# Patient Record
Sex: Female | Born: 1950 | Race: White | Hispanic: No | Smoking: Current every day smoker
Health system: Southern US, Community
[De-identification: ages and names within clinical notes are randomized; demographics above are authoritative.]

## PROBLEM LIST (undated history)

## (undated) DIAGNOSIS — C801 Malignant (primary) neoplasm, unspecified: Secondary | ICD-10-CM

## (undated) DIAGNOSIS — E785 Hyperlipidemia, unspecified: Secondary | ICD-10-CM

## (undated) DIAGNOSIS — F419 Anxiety disorder, unspecified: Secondary | ICD-10-CM

## (undated) DIAGNOSIS — K219 Gastro-esophageal reflux disease without esophagitis: Secondary | ICD-10-CM

## (undated) DIAGNOSIS — Z720 Tobacco use: Secondary | ICD-10-CM

## (undated) DIAGNOSIS — K259 Gastric ulcer, unspecified as acute or chronic, without hemorrhage or perforation: Secondary | ICD-10-CM

## (undated) DIAGNOSIS — E119 Type 2 diabetes mellitus without complications: Secondary | ICD-10-CM

## (undated) DIAGNOSIS — N189 Chronic kidney disease, unspecified: Secondary | ICD-10-CM

## (undated) DIAGNOSIS — M1990 Unspecified osteoarthritis, unspecified site: Secondary | ICD-10-CM

## (undated) DIAGNOSIS — F32A Depression, unspecified: Secondary | ICD-10-CM

## (undated) DIAGNOSIS — F329 Major depressive disorder, single episode, unspecified: Secondary | ICD-10-CM

## (undated) DIAGNOSIS — J449 Chronic obstructive pulmonary disease, unspecified: Secondary | ICD-10-CM

## (undated) HISTORY — DX: Major depressive disorder, single episode, unspecified: F32.9

## (undated) HISTORY — DX: Depression, unspecified: F32.A

## (undated) HISTORY — PX: TUBAL LIGATION: SHX77

## (undated) HISTORY — PX: FOOT SURGERY: SHX648

## (undated) HISTORY — DX: Gastric ulcer, unspecified as acute or chronic, without hemorrhage or perforation: K25.9

---

## 2010-02-18 ENCOUNTER — Ambulatory Visit: Payer: Self-pay | Admitting: Family Medicine

## 2011-04-23 DIAGNOSIS — K219 Gastro-esophageal reflux disease without esophagitis: Secondary | ICD-10-CM | POA: Insufficient documentation

## 2011-12-22 ENCOUNTER — Ambulatory Visit: Payer: Self-pay | Admitting: Family Medicine

## 2011-12-23 ENCOUNTER — Ambulatory Visit: Payer: Self-pay | Admitting: Family Medicine

## 2013-01-17 ENCOUNTER — Ambulatory Visit: Payer: Self-pay

## 2013-01-18 ENCOUNTER — Ambulatory Visit: Payer: Self-pay

## 2014-07-18 ENCOUNTER — Ambulatory Visit: Payer: Self-pay | Admitting: Internal Medicine

## 2014-07-25 DIAGNOSIS — E118 Type 2 diabetes mellitus with unspecified complications: Secondary | ICD-10-CM | POA: Insufficient documentation

## 2014-07-25 DIAGNOSIS — E1169 Type 2 diabetes mellitus with other specified complication: Secondary | ICD-10-CM | POA: Insufficient documentation

## 2015-01-24 ENCOUNTER — Ambulatory Visit
Admission: EM | Admit: 2015-01-24 | Discharge: 2015-01-24 | Disposition: A | Discharge status: Home / Self Care | Payer: Medicare Other | Attending: Family Medicine | Admitting: Family Medicine

## 2015-01-24 DIAGNOSIS — R300 Dysuria: Secondary | ICD-10-CM

## 2015-01-24 DIAGNOSIS — F17210 Nicotine dependence, cigarettes, uncomplicated: Secondary | ICD-10-CM | POA: Diagnosis not present

## 2015-01-24 DIAGNOSIS — E119 Type 2 diabetes mellitus without complications: Secondary | ICD-10-CM | POA: Diagnosis not present

## 2015-01-24 DIAGNOSIS — R309 Painful micturition, unspecified: Secondary | ICD-10-CM | POA: Insufficient documentation

## 2015-01-24 DIAGNOSIS — K219 Gastro-esophageal reflux disease without esophagitis: Secondary | ICD-10-CM | POA: Diagnosis not present

## 2015-01-24 DIAGNOSIS — F419 Anxiety disorder, unspecified: Secondary | ICD-10-CM | POA: Insufficient documentation

## 2015-01-24 DIAGNOSIS — N3090 Cystitis, unspecified without hematuria: Secondary | ICD-10-CM

## 2015-01-24 HISTORY — DX: Anxiety disorder, unspecified: F41.9

## 2015-01-24 HISTORY — DX: Gastro-esophageal reflux disease without esophagitis: K21.9

## 2015-01-24 HISTORY — DX: Type 2 diabetes mellitus without complications: E11.9

## 2015-01-24 LAB — URINALYSIS COMPLETE WITH MICROSCOPIC (ARMC ONLY)
BILIRUBIN URINE: NEGATIVE
GLUCOSE, UA: NEGATIVE mg/dL
KETONES UR: NEGATIVE mg/dL
NITRITE: POSITIVE — AB
Protein, ur: 100 mg/dL — AB
Specific Gravity, Urine: 1.02 (ref 1.005–1.030)
pH: 5.5 (ref 5.0–8.0)

## 2015-01-24 MED ORDER — CIPROFLOXACIN HCL 500 MG PO TABS: 500.0000 mg | ORAL_TABLET | Freq: Two times a day (BID) | ORAL | Status: DC

## 2015-01-24 NOTE — ED Notes (Signed)
Pt c/o painful urination for the past week with a hx of UTI

## 2015-01-24 NOTE — ED Provider Notes (Addendum)
CSN: [id number]     Arrival date & time 01/24/15  0818 History   First MD Initiated Contact with Patient 01/24/15 [phone]     Chief Complaint  Patient presents with  . Urinary Urgency    painful urination   (Consider location/radiation/quality/duration/timing/severity/associated sxs/prior Treatment) Patient is a 64 y.o. female presenting with dysuria. The history is provided by the patient.  Dysuria Pain quality:  Aching Onset quality:  Sudden Duration:  5 days Timing:  Constant Progression:  Unchanged Chronicity:  New Recent urinary tract infections: no   Relieved by:  Nothing Ineffective treatments:  Acetaminophen Urinary symptoms: discolored urine, frequent urination and hesitancy   Urinary symptoms: no foul-smelling urine   Associated symptoms: no fever, no vaginal discharge and no vomiting   Associated symptoms comment:  Chills Risk factors: no hx of pyelonephritis and no renal disease     Past Medical History  Diagnosis Date  . Diabetes mellitus without complication   . GERD (gastroesophageal reflux disease)   . Anxiety    Past Surgical History  Procedure Laterality Date  . Tubal ligation     No family history on file. History  Substance Use Topics  . Smoking status: Current Every Day Smoker -- 0.50 packs/day    Types: Cigarettes  . Smokeless tobacco: Never Used  . Alcohol Use: No   OB History    Gravida Para Term Preterm AB TAB SAB Ectopic Multiple Living   2 2 2  0 0 0 0 0 0 0     Review of Systems  Constitutional: Negative for fever.  Gastrointestinal: Negative for vomiting.  Genitourinary: Positive for dysuria. Negative for vaginal discharge.    Allergies  Review of patient's allergies indicates no known allergies.  Home Medications   Prior to Admission medications   Medication Sig Start Date End Date Taking? Authorizing Provider  clonazePAM (KLONOPIN) 1 MG tablet Take 1 mg by mouth 2 (two) times daily.   Yes Historical Provider, MD  metFORMIN  (GLUCOPHAGE) 500 MG tablet Take 500 mg by mouth daily with breakfast.   Yes Historical Provider, MD  omeprazole (PRILOSEC OTC) 20 MG tablet Take 20 mg by mouth daily.   Yes Historical Provider, MD  ciprofloxacin (CIPRO) 500 MG tablet Take 1 tablet (500 mg total) by mouth every 12 (twelve) hours. 01/24/15   Norval Gable, MD   BP 125/75 mmHg  Pulse 71  Temp(Src) 97.5 F (36.4 C) (Oral)  Resp 16  Ht 5\' 2"  (1.575 m)  Wt 157 lb (71.215 kg)  BMI 28.71 kg/m2  SpO2 97% Physical Exam  Constitutional: She appears well-developed and well-nourished. No distress.  Abdominal: Soft. Bowel sounds are normal. She exhibits no distension and no mass. There is tenderness (mild suprapubic tenderness to palpation; no CVA tenderness). There is no rebound and no guarding.  Skin: She is not diaphoretic.  Nursing note and vitals reviewed.   ED Course  Procedures (including critical care time) Labs Review Labs Reviewed  URINALYSIS COMPLETEWITH MICROSCOPIC [place])     Imaging Review No results found.   MDM   1. Dysuria   2. Cystitis    . New Prescriptions   CIPROFLOXACIN (CIPRO) 500 MG TABLET    Take 1 tablet (500 mg total) by mouth every 12 (twelve) hours.  Plan: 1. Test/x-ray results and diagnosis reviewed with patient 2. rx as per orders; risks, benefits, potential side effects reviewed with patient 3. Recommend supportive treatment with increased fluids 4. F/u prn if symptoms worsen or don't improve  Norval Gable, MD 01/24/15 6553  Norval Gable, MD 01/24/15 [phone]

## 2015-01-24 NOTE — Discharge Instructions (Signed)

## 2015-03-31 DIAGNOSIS — F324 Major depressive disorder, single episode, in partial remission: Secondary | ICD-10-CM | POA: Insufficient documentation

## 2015-11-16 ENCOUNTER — Ambulatory Visit (INDEPENDENT_AMBULATORY_CARE_PROVIDER_SITE_OTHER)
Admission: EM | Admit: 2015-11-16 | Discharge: 2015-11-16 | Disposition: A | Discharge status: Other Acute Inpatient Hospital | Payer: Medicare Other | Source: Home / Self Care | Attending: Family Medicine | Admitting: Family Medicine

## 2015-11-16 ENCOUNTER — Encounter
Admission: EM | Disposition: A | Discharge status: Home / Self Care | Payer: Self-pay | Source: Home / Self Care | Attending: Urology

## 2015-11-16 ENCOUNTER — Encounter: Payer: Self-pay | Admitting: Gynecology

## 2015-11-16 ENCOUNTER — Inpatient Hospital Stay
Admission: EM | Admit: 2015-11-16 | Discharge: 2015-11-20 | DRG: 689 | Disposition: A | Discharge status: Home / Self Care | Payer: Medicare Other | Attending: Urology | Admitting: Urology

## 2015-11-16 ENCOUNTER — Encounter: Payer: Self-pay | Admitting: Anesthesiology

## 2015-11-16 ENCOUNTER — Emergency Department: Payer: Medicare Other

## 2015-11-16 ENCOUNTER — Inpatient Hospital Stay: Payer: Medicare Other | Admitting: Anesthesiology

## 2015-11-16 DIAGNOSIS — J9811 Atelectasis: Secondary | ICD-10-CM | POA: Diagnosis present

## 2015-11-16 DIAGNOSIS — E119 Type 2 diabetes mellitus without complications: Secondary | ICD-10-CM | POA: Diagnosis present

## 2015-11-16 DIAGNOSIS — N200 Calculus of kidney: Secondary | ICD-10-CM

## 2015-11-16 DIAGNOSIS — J449 Chronic obstructive pulmonary disease, unspecified: Secondary | ICD-10-CM | POA: Diagnosis present

## 2015-11-16 DIAGNOSIS — N136 Pyonephrosis: Secondary | ICD-10-CM | POA: Diagnosis present

## 2015-11-16 DIAGNOSIS — N201 Calculus of ureter: Secondary | ICD-10-CM | POA: Diagnosis not present

## 2015-11-16 DIAGNOSIS — R509 Fever, unspecified: Secondary | ICD-10-CM

## 2015-11-16 DIAGNOSIS — I9589 Other hypotension: Secondary | ICD-10-CM | POA: Diagnosis not present

## 2015-11-16 DIAGNOSIS — R109 Unspecified abdominal pain: Secondary | ICD-10-CM | POA: Diagnosis not present

## 2015-11-16 DIAGNOSIS — B961 Klebsiella pneumoniae [K. pneumoniae] as the cause of diseases classified elsewhere: Secondary | ICD-10-CM | POA: Diagnosis present

## 2015-11-16 DIAGNOSIS — R1032 Left lower quadrant pain: Secondary | ICD-10-CM

## 2015-11-16 DIAGNOSIS — J9601 Acute respiratory failure with hypoxia: Secondary | ICD-10-CM | POA: Diagnosis present

## 2015-11-16 DIAGNOSIS — F17210 Nicotine dependence, cigarettes, uncomplicated: Secondary | ICD-10-CM | POA: Diagnosis present

## 2015-11-16 DIAGNOSIS — M545 Low back pain, unspecified: Secondary | ICD-10-CM

## 2015-11-16 DIAGNOSIS — N390 Urinary tract infection, site not specified: Secondary | ICD-10-CM | POA: Diagnosis not present

## 2015-11-16 DIAGNOSIS — Z8249 Family history of ischemic heart disease and other diseases of the circulatory system: Secondary | ICD-10-CM | POA: Diagnosis not present

## 2015-11-16 DIAGNOSIS — E785 Hyperlipidemia, unspecified: Secondary | ICD-10-CM | POA: Diagnosis present

## 2015-11-16 DIAGNOSIS — Z9851 Tubal ligation status: Secondary | ICD-10-CM

## 2015-11-16 DIAGNOSIS — K219 Gastro-esophageal reflux disease without esophagitis: Secondary | ICD-10-CM | POA: Diagnosis present

## 2015-11-16 HISTORY — DX: Hyperlipidemia, unspecified: E78.5

## 2015-11-16 HISTORY — DX: Chronic obstructive pulmonary disease, unspecified: J44.9

## 2015-11-16 HISTORY — PX: CYSTOSCOPY WITH STENT PLACEMENT: SHX5790

## 2015-11-16 HISTORY — DX: Tobacco use: Z72.0

## 2015-11-16 LAB — LIPASE, BLOOD: Lipase: 33 U/L (ref 11–51)

## 2015-11-16 LAB — URINALYSIS COMPLETE WITH MICROSCOPIC (ARMC ONLY)
BILIRUBIN URINE: NEGATIVE
GLUCOSE, UA: NEGATIVE mg/dL
KETONES UR: NEGATIVE mg/dL
Nitrite: NEGATIVE
Protein, ur: 30 mg/dL — AB
Specific Gravity, Urine: 1.015 (ref 1.005–1.030)
pH: 5 (ref 5.0–8.0)

## 2015-11-16 LAB — COMPREHENSIVE METABOLIC PANEL
ALBUMIN: 4.1 g/dL (ref 3.5–5.0)
ALT: 21 U/L (ref 14–54)
ANION GAP: 7 (ref 5–15)
AST: 23 U/L (ref 15–41)
Alkaline Phosphatase: 48 U/L (ref 38–126)
BUN: 20 mg/dL (ref 6–20)
CALCIUM: 9.1 mg/dL (ref 8.9–10.3)
CO2: 25 mmol/L (ref 22–32)
Chloride: 105 mmol/L (ref 101–111)
Creatinine, Ser: 1.33 mg/dL — ABNORMAL HIGH (ref 0.44–1.00)
GFR calc Af Amer: 47 mL/min — ABNORMAL LOW (ref >60)
GFR calc non Af Amer: 41 mL/min — ABNORMAL LOW (ref >60)
Glucose, Bld: 174 mg/dL — ABNORMAL HIGH (ref 65–99)
Potassium: 3.9 mmol/L (ref 3.5–5.1)
Sodium: 137 mmol/L (ref 135–145)
Total Bilirubin: 1.1 mg/dL (ref 0.3–1.2)
Total Protein: 7.1 g/dL (ref 6.5–8.1)

## 2015-11-16 LAB — CBC WITH DIFFERENTIAL/PLATELET
Basophils Absolute: 0.1 10*3/uL (ref 0–0.1)
Basophils Relative: 0 %
EOS ABS: 0 10*3/uL (ref 0–0.7)
Eosinophils Relative: 0 %
HCT: 43.5 % (ref 35.0–47.0)
Hemoglobin: 14.8 g/dL (ref 12.0–16.0)
Lymphocytes Relative: 3 %
Lymphs Abs: 0.6 10*3/uL — ABNORMAL LOW (ref 1.0–3.6)
MCH: 30.6 pg (ref 26.0–34.0)
MCHC: 34.1 g/dL (ref 32.0–36.0)
MCV: 89.6 fL (ref 80.0–100.0)
MONOS PCT: 2 %
Monocytes Absolute: 0.3 10*3/uL (ref 0.2–0.9)
Neutro Abs: 19.6 10*3/uL — ABNORMAL HIGH (ref 1.4–6.5)
Neutrophils Relative %: 95 %
Platelets: 238 10*3/uL (ref 150–440)
RBC: 4.85 MIL/uL (ref 3.80–5.20)
RDW: 13 % (ref 11.5–14.5)
WBC: 20.7 10*3/uL — ABNORMAL HIGH (ref 3.6–11.0)

## 2015-11-16 LAB — GLUCOSE, CAPILLARY: Glucose-Capillary: 115 mg/dL — ABNORMAL HIGH (ref 65–99)

## 2015-11-16 SURGERY — CYSTOSCOPY, WITH STENT INSERTION
Anesthesia: Choice | Laterality: Left

## 2015-11-16 SURGERY — CYSTOSCOPY, WITH STENT INSERTION
Anesthesia: General | Laterality: Left | Wound class: Clean Contaminated

## 2015-11-16 MED ORDER — DEXTROSE 5 % IV SOLN
1.0000 g | INTRAVENOUS | Status: DC
  Administered 2015-11-17 – 2015-11-18 (×2): 1 g via INTRAVENOUS
  Filled 2015-11-16 (×2): qty 10

## 2015-11-16 MED ORDER — FENTANYL CITRATE (PF) 100 MCG/2ML IJ SOLN: 25.0000 ug | INTRAMUSCULAR | Status: DC | PRN

## 2015-11-16 MED ORDER — PHENYLEPHRINE HCL 10 MG/ML IJ SOLN
INTRAMUSCULAR | Status: DC | PRN
  Administered 2015-11-16: 150 ug via INTRAVENOUS

## 2015-11-16 MED ORDER — OXYCODONE HCL 5 MG PO TABS
5.0000 mg | ORAL_TABLET | ORAL | Status: DC | PRN
  Administered 2015-11-17 (×2): 5 mg via ORAL
  Administered 2015-11-17 – 2015-11-18 (×4): 10 mg via ORAL
  Administered 2015-11-18: 5 mg via ORAL
  Administered 2015-11-19 (×2): 10 mg via ORAL
  Administered 2015-11-20: 5 mg via ORAL
  Administered 2015-11-20: 10 mg via ORAL
  Filled 2015-11-16: qty 2
  Filled 2015-11-16 (×3): qty 1
  Filled 2015-11-16 (×2): qty 2
  Filled 2015-11-16: qty 1
  Filled 2015-11-16 (×2): qty 2
  Filled 2015-11-16 (×2): qty 1
  Filled 2015-11-16: qty 2

## 2015-11-16 MED ORDER — ONDANSETRON HCL 4 MG/2ML IJ SOLN
INTRAMUSCULAR | Status: DC | PRN
  Administered 2015-11-16: 4 mg via INTRAVENOUS

## 2015-11-16 MED ORDER — ONDANSETRON HCL 4 MG/2ML IJ SOLN
4.0000 mg | INTRAMUSCULAR | Status: DC | PRN
  Administered 2015-11-18 – 2015-11-19 (×4): 4 mg via INTRAVENOUS
  Filled 2015-11-16 (×4): qty 2

## 2015-11-16 MED ORDER — OXYCODONE HCL 5 MG PO TABS: 5.0000 mg | ORAL_TABLET | Freq: Once | ORAL | Status: DC | PRN

## 2015-11-16 MED ORDER — SODIUM CHLORIDE 0.9 % IV BOLUS (SEPSIS)
1000.0000 mL | Freq: Once | INTRAVENOUS | Status: AC
  Administered 2015-11-16: 1000 mL via INTRAVENOUS

## 2015-11-16 MED ORDER — SODIUM CHLORIDE 0.9 % IV SOLN
INTRAVENOUS | Status: DC | PRN
  Administered 2015-11-16: 20:00:00 via INTRAVENOUS

## 2015-11-16 MED ORDER — DEXTROSE 5 % IV SOLN
1.0000 g | Freq: Once | INTRAVENOUS | Status: AC
  Administered 2015-11-16: 1 g via INTRAVENOUS
  Filled 2015-11-16: qty 10

## 2015-11-16 MED ORDER — OXYCODONE HCL 5 MG/5ML PO SOLN: 5.0000 mg | Freq: Once | ORAL | Status: DC | PRN

## 2015-11-16 MED ORDER — LACTATED RINGERS IV SOLN
INTRAVENOUS | Status: DC
  Administered 2015-11-16 – 2015-11-18 (×4): via INTRAVENOUS

## 2015-11-16 MED ORDER — ACETAMINOPHEN 325 MG PO TABS
650.0000 mg | ORAL_TABLET | Freq: Four times a day (QID) | ORAL | Status: DC
  Administered 2015-11-16 – 2015-11-19 (×11): 650 mg via ORAL
  Filled 2015-11-16 (×11): qty 2

## 2015-11-16 MED ORDER — MORPHINE SULFATE (PF) 4 MG/ML IV SOLN
4.0000 mg | Freq: Once | INTRAVENOUS | Status: AC
  Administered 2015-11-16: 4 mg via INTRAVENOUS
  Filled 2015-11-16: qty 1

## 2015-11-16 MED ORDER — HYDROMORPHONE HCL 1 MG/ML IJ SOLN: 0.5000 mg | INTRAMUSCULAR | Status: DC | PRN

## 2015-11-16 MED ORDER — ONDANSETRON HCL 4 MG/2ML IJ SOLN
4.0000 mg | Freq: Once | INTRAMUSCULAR | Status: AC
Start: 2015-11-16 — End: 2015-11-16
  Administered 2015-11-16: 4 mg via INTRAVENOUS
  Filled 2015-11-16: qty 2

## 2015-11-16 MED ORDER — DOCUSATE SODIUM 100 MG PO CAPS
100.0000 mg | ORAL_CAPSULE | Freq: Two times a day (BID) | ORAL | Status: DC
Start: 2015-11-16 — End: 2015-11-20
  Administered 2015-11-16 – 2015-11-20 (×8): 100 mg via ORAL
  Filled 2015-11-16 (×9): qty 1

## 2015-11-16 MED ORDER — IOTHALAMATE MEGLUMINE 43 % IV SOLN
INTRAVENOUS | Status: DC | PRN
  Administered 2015-11-16: 5 mL via URETHRAL

## 2015-11-16 SURGICAL SUPPLY — 24 items
BAG DRAIN CYSTO-URO LG1000N (MISCELLANEOUS) IMPLANT
CATH URETL 5X70 OPEN END (CATHETERS) ×3 IMPLANT
CNTNR SPEC 2.5X3XGRAD LEK (MISCELLANEOUS) ×1
CONRAY 43 FOR UROLOGY 50M (MISCELLANEOUS) ×3 IMPLANT
CONT SPEC 4OZ STER OR WHT (MISCELLANEOUS) ×2
CONTAINER SPEC 2.5X3XGRAD LEK (MISCELLANEOUS) ×1 IMPLANT
GLOVE BIO SURGEON STRL SZ7 (GLOVE) ×6 IMPLANT
GLOVE BIO SURGEON STRL SZ7.5 (GLOVE) IMPLANT
GOWN STRL REUS W/ TWL LRG LVL4 (GOWN DISPOSABLE) ×2 IMPLANT
GOWN STRL REUS W/TWL LRG LVL4 (GOWN DISPOSABLE) ×4
GUIDEWIRE STR ZIPWIRE 035X150 (MISCELLANEOUS) ×3 IMPLANT
KIT RM TURNOVER CYSTO AR (KITS) ×3 IMPLANT
PACK CYSTO AR (MISCELLANEOUS) ×3 IMPLANT
PREP PVP WINGED SPONGE (MISCELLANEOUS) ×3 IMPLANT
SENSORWIRE 0.038 NOT ANGLED (WIRE) ×3
SET CYSTO W/LG BORE CLAMP LF (SET/KITS/TRAYS/PACK) ×3 IMPLANT
SOL .9 NS 3000ML IRR  AL (IV SOLUTION)
SOL .9 NS 3000ML IRR UROMATIC (IV SOLUTION) IMPLANT
SOL PREP PVP 2OZ (MISCELLANEOUS) ×3
SOLUTION PREP PVP 2OZ (MISCELLANEOUS) ×1 IMPLANT
STENT URET 6FRX24 CONTOUR (STENTS) IMPLANT
STENT URET 6FRX26 CONTOUR (STENTS) ×3 IMPLANT
WATER STERILE IRR 1000ML POUR (IV SOLUTION) ×3 IMPLANT
WIRE SENSOR 0.038 NOT ANGLED (WIRE) ×1 IMPLANT

## 2015-11-16 NOTE — ED Notes (Signed)
Patient c/o lower back pain that rotate to her lower abdomen x this am.

## 2015-11-16 NOTE — H&P (Signed)
Samantha Welch is an 65 y.o. female.   Chief Complaint: left ureteral stone HPI: 65 y.o. female who presents to the ER this afternoon due to left flank and abdominal pain.  She reports waking up at 8 am due to pain. She has had nausea, vomiting, and feeling weak.  She has never had any stones in the past.  She denies dysuria. Denies hematuria. No measured fevers however her friend reports a concern for chills. Her pain is alleviated by the morphine she received. No other precipitating or worsening factor  Past Medical History  Diagnosis Date  . Diabetes mellitus without complication ([place])   . GERD (gastroesophageal reflux disease)   . Anxiety   . Hyperlipemia   . COPD (chronic obstructive pulmonary disease) ([place])   . Tobacco abuse disorder     Past Surgical History  Procedure Laterality Date  . Tubal ligation      No family history on file. Social History:  reports that she has been smoking Cigarettes.  She has been smoking about 0.50 packs per day. She has never used smokeless tobacco. She reports that she does not drink alcohol or use illicit drugs.  Allergies: No Known Allergies   (Not in a hospital admission)  Results for orders placed or performed during the hospital encounter of 11/16/15 (from the past 48 hour(s))  CBC with Differential     Status: Abnormal   Collection Time: 11/16/15  2:56 PM  Result Value Ref Range   WBC 20.7 (H) 3.6 - 11.0 K/uL   RBC 4.85 3.80 - 5.20 MIL/uL   Hemoglobin 14.8 12.0 - 16.0 g/dL   HCT 43.5 35.0 - 47.0 %   MCV 89.6 80.0 - 100.0 fL   MCH 30.6 26.0 - 34.0 pg   MCHC 34.1 32.0 - 36.0 g/dL   RDW 13.0 11.5 - 14.5 %   Platelets 238 150 - 440 K/uL   Neutrophils Relative % 95 %   Neutro Abs 19.6 (H) 1.4 - 6.5 K/uL   Lymphocytes Relative 3 %   Lymphs Abs 0.6 (L) 1.0 - 3.6 K/uL   Monocytes Relative 2 %   Monocytes Absolute 0.3 0.2 - 0.9 K/uL   Eosinophils Relative 0 %   Eosinophils Absolute 0.0 0 - 0.7 K/uL   Basophils Relative 0 %   Basophils Absolute 0.1 0 - 0.1 K/uL  Lipase, blood     Status: None   Collection Time: 11/16/15  2:56 PM  Result Value Ref Range   Lipase 33 11 - 51 U/L  Comprehensive metabolic panel     Status: Abnormal   Collection Time: 11/16/15  2:56 PM  Result Value Ref Range   Sodium 137 135 - 145 mmol/L   Potassium 3.9 3.5 - 5.1 mmol/L   Chloride 105 101 - 111 mmol/L   CO2 25 22 - 32 mmol/L   Glucose, Bld 174 (H) 65 - 99 mg/dL   BUN 20 6 - 20 mg/dL   Creatinine, Ser 1.33 (H) 0.44 - 1.00 mg/dL   Calcium 9.1 8.9 - 10.3 mg/dL   Total Protein 7.1 6.5 - 8.1 g/dL   Albumin 4.1 3.5 - 5.0 g/dL   AST 23 15 - 41 U/L   ALT 21 14 - 54 U/L   Alkaline Phosphatase 48 38 - 126 U/L   Total Bilirubin 1.1 0.3 - 1.2 mg/dL   GFR calc non Af Amer 41 (L) >60 mL/min   GFR calc Af Amer 47 (L) >60 mL/min  Comment: (NOTE) The eGFR has been calculated using the CKD EPI equation. This calculation has not been validated in all clinical situations. eGFR's persistently <60 mL/min signify possible Chronic Kidney Disease.    Anion gap 7 5 - 15  Urinalysis complete, with microscopic ([facility] only)     Status: Abnormal   Collection Time: 11/16/15  2:56 PM  Result Value Ref Range   Color, Urine YELLOW (A) YELLOW   APPearance CLOUDY (A) CLEAR   Glucose, UA NEGATIVE NEGATIVE mg/dL   Bilirubin Urine NEGATIVE NEGATIVE   Ketones, ur NEGATIVE NEGATIVE mg/dL   Specific Gravity, Urine 1.015 1.005 - 1.030   Hgb urine dipstick 3+ (A) NEGATIVE   pH 5.0 5.0 - 8.0   Protein, ur 30 (A) NEGATIVE mg/dL   Nitrite NEGATIVE NEGATIVE   Leukocytes, UA 2+ (A) NEGATIVE   RBC / HPF TOO NUMEROUS TO COUNT 0 - 5 RBC/hpf   WBC, UA TOO NUMEROUS TO COUNT 0 - 5 WBC/hpf   Bacteria, UA MANY (A) NONE SEEN   Squamous Epithelial / LPF 0-5 (A) NONE SEEN   WBC Clumps PRESENT    Mucous PRESENT    Ct Renal Stone Study  11/16/2015  CLINICAL DATA:  Awoke with left flank pain. EXAM: CT ABDOMEN AND PELVIS WITHOUT CONTRAST TECHNIQUE: Multidetector CT  imaging of the abdomen and pelvis was performed following the standard protocol without IV contrast. COMPARISON:  None. FINDINGS: Lower chest: Minimal dependent atelectasis at the lung bases. Heart is normal in size. No pleural effusion. Liver: Mild diffusely decreased density consistent with steatosis. No evidence of focal lesion allowing for lack contrast. Hepatobiliary: Gallbladder physiologically distended, no calcified stone. No biliary dilatation. Pancreas: No ductal dilatation or inflammation. Mild motion artifact at this level. Spleen: Normal, allowing for mild motion artifact. Adrenal glands: No nodule. Kidneys: Obstructing 3 x 6 mm stone in the left proximal ureter (at the level of L2) with proximal hydroureteronephrosis and perinephric stranding. Ureter distal to this is decompressed. No additional nonobstructing stones in either kidney. No right hydronephrosis. Stomach/Bowel: Limited assessment secondary to lack of enteric contrast and mild motion artifact. Stomach physiologically distended with ingested contents. There are no dilated or thickened small bowel loops. Small volume of stool throughout the colon without colonic wall thickening. The appendix is tentatively identified, no periappendiceal or pericecal inflammatory change. Vascular/Lymphatic: No retroperitoneal adenopathy. Abdominal aorta is normal in caliber. Mild to moderate atherosclerosis without aneurysm. Reproductive: Uterus remains in situ.  No adnexal mass. Bladder: Physiologically distended, no wall thickening. No bladder stone. Other: No free air, free fluid, or intra-abdominal fluid collection. Musculoskeletal: There are no acute or suspicious osseous abnormalities. IMPRESSION: Obstructing 3 x 6 mm stone in the left proximal ureter with proximal hydroureteronephrosis. Electronically Signed   By: Jeb Levering M.D.   On: 11/16/2015 18:18    Review of Systems  Constitutional: Positive for chills. Negative for fever, weight loss,  malaise/fatigue and diaphoresis.  HENT: Negative for hearing loss and sore throat.   Eyes: Negative for blurred vision and double vision.  Respiratory: Negative for cough, hemoptysis and wheezing.   Cardiovascular: Negative for chest pain and claudication.  Gastrointestinal: Positive for nausea and abdominal pain. Negative for heartburn, vomiting, diarrhea, constipation and blood in stool.  Genitourinary: Positive for flank pain. Negative for dysuria, urgency, frequency and hematuria.  Musculoskeletal: Negative for myalgias and back pain.  Skin: Negative for itching and rash.  Neurological: Positive for weakness. Negative for tingling, seizures and headaches.  Endo/Heme/Allergies: Does not bruise/bleed  easily.  Psychiatric/Behavioral: Negative for depression. The patient is nervous/anxious.     BP 120/60 mmHg  Pulse 93  Temp(Src) 99.7 F (37.6 C) (Oral)  Resp 20  Ht 5' 2"  (1.575 m)  Wt 73.483 kg (162 lb)  BMI 29.62 kg/m2  SpO2 92%   General: well-developed female, in no acute distress HEENT: PERRL, EOMI, OP moist without lesions Neck: supple, trachea midline; no thyromegaly Chest: normal to palpation Lungs: clear bilaterally without wheeze or retractions Cardiac: RRR without murmurs, gallops, rubs; distal pulses 2+ Abd: Soft, nontender, nondistended, positive bowel sounds.  No organomegaly Ext: No clubbing, cyanosis, edema Neuro: CN grossly intact. Moves all extremities Derm: no significant rashes or nodules Lymph: no cervical or supraclavicular nodes  Assessment/Plan 65 y.o. female with an infected left ureteral stone.  Her WBC is 20k and thus urgent decompression is warranted. We will proceed with urgent placement of a ureteral stent.   We discussed the risks associated with surgery including bleeding, infection, damage to surrounding structures, need for secondary procedures. She is aware there is a risk of heart attack, stroke, DVTs, and even death.     Kelii Chittum J  Madden-Fuentes 11/16/2015, 7:04 PM

## 2015-11-16 NOTE — ED Notes (Signed)
Patient transported to OR at this time.

## 2015-11-16 NOTE — Anesthesia Procedure Notes (Signed)
Procedure Name: LMA Insertion Date/Time: 11/16/2015 8:07 PM Performed by: Jonna Clark Pre-anesthesia Checklist: Patient identified, Patient being monitored, Timeout performed, Emergency Drugs available and Suction available Patient Re-evaluated:Patient Re-evaluated prior to inductionOxygen Delivery Method: Circle system utilized Preoxygenation: Pre-oxygenation with 100% oxygen Intubation Type: IV induction Ventilation: Mask ventilation without difficulty LMA: LMA inserted LMA Size: 3.5 Tube type: Oral Number of attempts: 1 Placement Confirmation: positive ETCO2 and breath sounds checked- equal and bilateral Tube secured with: Tape Dental Injury: Teeth and Oropharynx as per pre-operative assessment

## 2015-11-16 NOTE — ED Notes (Signed)
Patient denies N/V/D.

## 2015-11-16 NOTE — ED Notes (Signed)
Report given to OR nurse at this time. States she will come and get the patient once consent is signed.

## 2015-11-16 NOTE — Op Note (Signed)
PREOPERATIVE DIAGNOSIS:  Infected left ureteral stone   POSTOPERATIVE DIAGNOSIS:  Infected left ureteral stone   SURGERY DATE: 11/16/2015  PROCEDURE:  1) Cystourethroscopy  2) left retrograde Pyelogram 3) left ureteral stent placement 4) Fluoroscopy   ATTENDING SURGEON:  . Dereck Leep, MD   ANESTHESIA:  Choice   ESTIMATED BLOOD LOSS:  minimal  SPECIMENS:  None.   DRAINS:  A 6-French x 26 cm double-J ureteral stent.   INDICATIONS FOR PROCEDURE:   This is a 65 y.o. female who presented to the ER due to flank pain. CT demonstrated a proximal left ureteral stone and a WBC of 20k.. Given this, the decision was made to proceed to the OR for stent placement  We discussed the risks associated with surgery including bleeding, infection, damage to surrounding structures, need for secondary procedures. She is aware there is a risk of heart attack, stroke, DVTs, and even death.     DESCRIPTION OF PROCEDURE IN DETAIL:  After informed consent was obtained the patient was brought to the operative theater and placed on the operating room table in the supine position.  Anesthesia was induced.  The patient was then placed in the dorsal lithotomy position. The patient was prepped and draped in the usual sterile fashion.  The patient was given appropriate antibiotics within 1 hour at the start of the case.  Bilateral sequential compression devices were placed on his legs for DVT prophylaxis.  A surgical time-out occurred with all in the room in agreement with the surgery to be performed and the site of surgery.     A 21-French rigid cystoscope was placed in the patient's bladder via his urethra.  The urethra was noted to be normal with no stricture or scarring.  Upon entry into the bladder it was evaluated, and found to be normal.  The patient's left ureteral orifice was identified, and a 0.038 Sensor wire was used to cannulate it.  A 6 french open ended ureteral catheter was then advanced  into the ureter over the wire. The wire was removed and a retrograde pyelogram was obtained.  Retrograde pyelogram demonstrated mild dilation of the calyces, mild proximal and renal pelvic dilation.   A sensor guidewire was then advanced through the ureteral catheter and placed up the ureter and advanced to the level of the renal pelvis under fluoroscopic guidance.    Next a 6-French x 26 cm double-J ureteral stent was placed over the wire and advanced to the level of the renal pelvis under fluoroscopic guidance.  When the stent was noted to be within the correct position the Sensor wire was removed, and a good coil was seen within the renal pelvis by fluoroscopy as well as within the bladder both by fluoroscopy and by direct vision with the use of the cystoscope.  The patient's bladder was then drained, the cystoscope was removed.  The patient tolerated the procedure well, and postoperatively was transported to the PACU in stable condition.

## 2015-11-16 NOTE — ED Notes (Signed)
Per EMS, patient comes from [facility] with c/o left abdominal, back, and arm pain. The abdominal and back pain is new and it woke the patient from her sleep. The arm pain has been ongoing x3 weeks. [place] states the patient was hypotensive with a systolic in the [id number]. Current BP is 140/59. Patient is A&O x4. Resting well during assessment.

## 2015-11-16 NOTE — ED Provider Notes (Signed)
[facility] Emergency Department Provider Note    ____________________________________________  Time seen: ~1440  I have reviewed the triage vital signs and the nursing notes.   HISTORY  Chief Complaint Abdominal Pain   History limited by: Not Limited   HPI Samantha Welch is a 65 y.o. female who presents to the emergency department today because of concerns for left flank pain. The patient states that it woke her up this morning. Has been constant since then. It is severe. It is sharp. It wraps around into her left groin. The patient had associated nausea and vomiting this morning. Denies similar symptoms in the past.    Past Medical History  Diagnosis Date  . Diabetes mellitus without complication ([place])   . GERD (gastroesophageal reflux disease)   . Anxiety   . Hyperlipemia   . COPD (chronic obstructive pulmonary disease) ([place])   . Tobacco abuse disorder     There are no active problems to display for this patient.   Past Surgical History  Procedure Laterality Date  . Tubal ligation      Current Outpatient Rx  Name  Route  Sig  Dispense  Refill  . aspirin 81 MG tablet   Oral   Take 81 mg by mouth daily.         . ciprofloxacin (CIPRO) 500 MG tablet   Oral   Take 1 tablet (500 mg total) by mouth every 12 (twelve) hours.   14 tablet   0   . clonazePAM (KLONOPIN) 1 MG tablet   Oral   Take 1 mg by mouth 2 (two) times daily.         . metFORMIN (GLUCOPHAGE) 500 MG tablet   Oral   Take 500 mg by mouth daily with breakfast.         . omega-3 acid ethyl esters (LOVAZA) 1 g capsule   Oral   Take by mouth 2 (two) times daily.         Marland Kitchen omeprazole (PRILOSEC OTC) 20 MG tablet   Oral   Take 20 mg by mouth daily.         . pravastatin (PRAVACHOL) 40 MG tablet   Oral   Take 40 mg by mouth daily.         Marland Kitchen tiotropium (SPIRIVA) 18 MCG inhalation capsule   Inhalation   Place 18 mcg into inhaler and inhale daily.          Marland Kitchen venlafaxine XR (EFFEXOR-XR) 75 MG 24 hr capsule   Oral   Take 75 mg by mouth daily with breakfast.           Allergies Review of patient's allergies indicates no known allergies.  No family history on file.  Social History Social History  Substance Use Topics  . Smoking status: Current Every Day Smoker -- 0.50 packs/day    Types: Cigarettes  . Smokeless tobacco: Never Used  . Alcohol Use: No    Review of Systems  Constitutional: Negative for fever. Cardiovascular: Negative for chest pain. Respiratory: Negative for shortness of breath. Gastrointestinal: Positive for left flank pain and left groin pain. Neurological: Negative for headaches, focal weakness or numbness.   10-point ROS otherwise negative.  ____________________________________________   PHYSICAL EXAM:  VITAL SIGNS: ED Triage Vitals  Enc Vitals Group     BP 11/16/15 1452 140/59 mmHg     Pulse Rate 11/16/15 1452 73     Resp 11/16/15 1452 18     Temp 11/16/15  1452 99.3 F (37.4 C)     Temp Source 11/16/15 1452 Oral     SpO2 11/16/15 1452 98 %     Weight 11/16/15 1452 162 lb (73.483 kg)     Height 11/16/15 1452 5\' 2"  (1.575 m)     Head Cir --      Peak Flow --      Pain Score 11/16/15 1453 8   Constitutional: Alert and oriented. Appears mildly uncomfortable Eyes: Conjunctivae are normal. PERRL. Normal extraocular movements. ENT   Head: Normocephalic and atraumatic.   Nose: No congestion/rhinnorhea.   Mouth/Throat: Mucous membranes are moist.   Neck: No stridor. Hematological/Lymphatic/Immunilogical: No cervical lymphadenopathy. Cardiovascular: Normal rate, regular rhythm.  No murmurs, rubs, or gallops. Respiratory: Normal respiratory effort without tachypnea nor retractions. Breath sounds are clear and equal bilaterally. No wheezes/rales/rhonchi. Gastrointestinal: Soft and nontender. No distention.  Genitourinary: Deferred Musculoskeletal: Normal range of motion in all  extremities. No joint effusions.  No lower extremity tenderness nor edema. Neurologic:  Normal speech and language. No gross focal neurologic deficits are appreciated.  Skin:  Skin is warm, dry and intact. No rash noted. Psychiatric: Mood and affect are normal. Speech and behavior are normal. Patient exhibits appropriate insight and judgment.  ____________________________________________    LABS (pertinent positives/negatives)  Labs Reviewed  CBC WITH DIFFERENTIAL/PLATELET - Abnormal; Notable for the following:    WBC 20.7 (*)    Neutro Abs 19.6 (*)    Lymphs Abs 0.6 (*)    All other components within normal limits  COMPREHENSIVE METABOLIC PANEL - Abnormal; Notable for the following:    Glucose, Bld 174 (*)    Creatinine, Ser 1.33 (*)    GFR calc non Af Amer 41 (*)    GFR calc Af Amer 47 (*)    All other components within normal limits  URINALYSIS COMPLETEWITH MICROSCOPIC ([facility] ONLY) - Abnormal; Notable for the following:    Color, Urine YELLOW (*)    APPearance CLOUDY (*)    Hgb urine dipstick 3+ (*)    Protein, ur 30 (*)    Leukocytes, UA 2+ (*)    Bacteria, UA MANY (*)    Squamous Epithelial / LPF 0-5 (*)    All other components within normal limits  LIPASE, BLOOD     ____________________________________________   EKG  None  ____________________________________________    RADIOLOGY  CT renal  IMPRESSION: Obstructing 3 x 6 mm stone in the left proximal ureter with proximal hydroureteronephrosis.  ____________________________________________   PROCEDURES  Procedure(s) performed: None  Critical Care performed: No  ____________________________________________   INITIAL IMPRESSION / ASSESSMENT AND PLAN / ED COURSE  Pertinent labs & imaging results that were available during my care of the patient were reviewed by me and considered in my medical decision making (see chart for details).  Patient presented to the emergency department today because of  concerns for left flank pain that started suddenly this morning. Urinalysis was concerning for multiple white blood cells as well as red blood cells. Given patient's history of pain a renal study CT was obtained. This did show a 3 x 6 mm stone in the left proximal ureter. Given the stone and concern for infection urology was consulted. They will plan on admitting the patient for likely stent placement tonight.  ____________________________________________   FINAL CLINICAL IMPRESSION(S) / ED DIAGNOSES  Final diagnoses:  Left flank pain  UTI (lower urinary tract infection)  Kidney stone     Nance Pear, MD 11/16/15 [id number]

## 2015-11-16 NOTE — Anesthesia Preprocedure Evaluation (Signed)
Anesthesia Evaluation  Patient identified by MRN, date of birth, ID band Patient awake    Reviewed: Allergy & Precautions, H&P , NPO status , Patient's Chart, lab work & pertinent test results  History of Anesthesia Complications Negative for: history of anesthetic complications  Airway Mallampati: III  TM Distance: >3 FB Neck ROM: limited    Dental  (+) Poor Dentition, Chipped, Missing   Pulmonary shortness of breath and with exertion, COPD, Current Smoker,    Pulmonary exam normal breath sounds clear to auscultation       Cardiovascular Exercise Tolerance: Good (-) angina+ DOE  (-) Past MI Normal cardiovascular exam Rhythm:regular Rate:Normal     Neuro/Psych PSYCHIATRIC DISORDERS Anxiety negative neurological ROS     GI/Hepatic negative GI ROS, Neg liver ROS, GERD  Controlled,  Endo/Other  diabetes, Type 2, Oral Hypoglycemic Agents  Renal/GU negative Renal ROS  negative genitourinary   Musculoskeletal   Abdominal   Peds  Hematology negative hematology ROS (+)   Anesthesia Other Findings Past Medical History:   Diabetes mellitus without complication (HCC)                 GERD (gastroesophageal reflux disease)                       Anxiety                                                      Hyperlipemia                                                 COPD (chronic obstructive pulmonary disease) (*              Tobacco abuse disorder                                      Past Surgical History:   TUBAL LIGATION                                               BMI    Body Mass Index   29.62 kg/m 2      Reproductive/Obstetrics negative OB ROS                             Anesthesia Physical Anesthesia Plan  ASA: III  Anesthesia Plan: General LMA   Post-op Pain Management:    Induction:   Airway Management Planned:   Additional Equipment:   Intra-op Plan:   Post-operative  Plan:   Informed Consent: I have reviewed the patients History and Physical, chart, labs and discussed the procedure including the risks, benefits and alternatives for the proposed anesthesia with the patient or authorized representative who has indicated his/her understanding and acceptance.   Dental Advisory Given  Plan Discussed with: Anesthesiologist, CRNA and Surgeon  Anesthesia Plan Comments:         Anesthesia Quick Evaluation

## 2015-11-16 NOTE — ED Notes (Signed)
Patient transported to CT 

## 2015-11-16 NOTE — ED Provider Notes (Signed)
CSN: [id number]     Arrival date & time 11/16/15  1245 History   First MD Initiated Contact with Patient 11/16/15 1337     Chief Complaint  Patient presents with  . Back Pain   (Consider location/radiation/quality/duration/timing/severity/associated sxs/prior Treatment) HPI: Patient presents today with symptoms of left lower back pain. Patient states that the pain woke her up out of sleep early this morning. The pain radiates to her left abdomen. She denies any urinary symptoms. She has vomited a few times since the symptoms started earlier today. She denies any known discogenic problems. She denies any alcohol use but does smoke. She denies any cardiac history. She has had some left shoulder pain for the last few weeks. She denies any weakness of any extremities. She denies any known history of aneurysm or nephrolithiasis. She does complain of some dry mouth.  Past Medical History  Diagnosis Date  . Diabetes mellitus without complication ([place])   . GERD (gastroesophageal reflux disease)   . Anxiety   . Hyperlipemia   . COPD (chronic obstructive pulmonary disease) ([place])   . Tobacco abuse disorder    Past Surgical History  Procedure Laterality Date  . Tubal ligation     No family history on file. Social History  Substance Use Topics  . Smoking status: Current Every Day Smoker -- 0.50 packs/day    Types: Cigarettes  . Smokeless tobacco: Never Used  . Alcohol Use: No   OB History    Gravida Para Term Preterm AB TAB SAB Ectopic Multiple Living   2 2 2  0 0 0 0 0 0 0     Review of Systems: Negative except mentioned above.  Allergies  Review of patient's allergies indicates no known allergies.  Home Medications   Prior to Admission medications   Medication Sig Start Date End Date Taking? Authorizing Provider  aspirin 81 MG tablet Take 81 mg by mouth daily.   Yes Historical Provider, MD  metFORMIN (GLUCOPHAGE) 500 MG tablet Take 500 mg by mouth daily with breakfast.   Yes  Historical Provider, MD  omega-3 acid ethyl esters (LOVAZA) 1 g capsule Take by mouth 2 (two) times daily.   Yes Historical Provider, MD  pravastatin (PRAVACHOL) 40 MG tablet Take 40 mg by mouth daily.   Yes Historical Provider, MD  tiotropium (SPIRIVA) 18 MCG inhalation capsule Place 18 mcg into inhaler and inhale daily.   Yes Historical Provider, MD  venlafaxine XR (EFFEXOR-XR) 75 MG 24 hr capsule Take 75 mg by mouth daily with breakfast.   Yes Historical Provider, MD  ciprofloxacin (CIPRO) 500 MG tablet Take 1 tablet (500 mg total) by mouth every 12 (twelve) hours. 01/24/15   Norval Gable, MD  clonazePAM (KLONOPIN) 1 MG tablet Take 1 mg by mouth 2 (two) times daily.    Historical Provider, MD  omeprazole (PRILOSEC OTC) 20 MG tablet Take 20 mg by mouth daily.    Historical Provider, MD   Meds Ordered and Administered this Visit  Medications - No data to display  BP 88/43 mmHg  Pulse 71  Temp(Src) 98.5 F (36.9 C) (Oral)  Resp 16  Ht 5\' 2"  (1.575 m)  Wt 152 lb (68.947 kg)  BMI 27.79 kg/m2  SpO2 97% No data found.   Physical Exam   GENERAL: pale  HEENT: no pharyngeal erythema, no exudate, no erythema of TMs, no cervical LAD RESP: CTA B CARD: RRR ABD: +BS, mild left lower quadrant tenderness, no bruits heard, no masses appreciated MSK: points  to left SI area as area of discomfort, FROM, -SLR NEURO: AAO, CN II-XII grossly intact   ED Course  Procedures (including critical care time)  Labs Review Labs Reviewed  URINE CULTURE  URINALYSIS COMPLETEWITH MICROSCOPIC ([place])    Imaging Review No results found.    MDM  A/P: Abdominal pain, hypotension- EKG was done which does show normal sinus rhythm with nonspecific T-wave changes, no ST elevations or depressions. Unsure of etiology of patient's symptoms could be related to nephrolithiasis, aneurysm,etc. Will need further evaluation with labs, UA, imaging at ER. EMS is here to take patient to the ER. IV was placed prior  to transfer.    Paulina Fusi, MD 11/16/15 1416

## 2015-11-16 NOTE — Transfer of Care (Signed)
Immediate Anesthesia Transfer of Care Note  Patient: Samantha Welch  Procedure(s) Performed: Procedure(s): CYSTOSCOPY WITH STENT PLACEMENT (Left)  Patient Location: PACU  Anesthesia Type:General  Level of Consciousness: awake, alert  and oriented  Airway & Oxygen Therapy: Patient Spontanous Breathing and Patient connected to face mask oxygen  Post-op Assessment: Report given to RN and Post -op Vital signs reviewed and stable  Post vital signs: Reviewed and stable  Last Vitals:  Filed Vitals:   11/16/15 1930 11/16/15 2040  BP: 103/50 107/51  Pulse: 94 81  Temp:  37.8 C  Resp: 25 17    Complications: No apparent anesthesia complications

## 2015-11-17 ENCOUNTER — Encounter: Payer: Self-pay | Admitting: Urology

## 2015-11-17 ENCOUNTER — Inpatient Hospital Stay: Payer: Medicare Other

## 2015-11-17 DIAGNOSIS — N200 Calculus of kidney: Secondary | ICD-10-CM

## 2015-11-17 LAB — BASIC METABOLIC PANEL
ANION GAP: 7 (ref 5–15)
BUN: 21 mg/dL — AB (ref 6–20)
CALCIUM: 7.7 mg/dL — AB (ref 8.9–10.3)
CO2: 24 mmol/L (ref 22–32)
Chloride: 105 mmol/L (ref 101–111)
Creatinine, Ser: 1.32 mg/dL — ABNORMAL HIGH (ref 0.44–1.00)
GFR calc Af Amer: 48 mL/min — ABNORMAL LOW (ref >60)
GFR, EST NON AFRICAN AMERICAN: 41 mL/min — AB (ref >60)
GLUCOSE: 162 mg/dL — AB (ref 65–99)
Potassium: 4.1 mmol/L (ref 3.5–5.1)
Sodium: 136 mmol/L (ref 135–145)

## 2015-11-17 LAB — INFLUENZA PANEL BY PCR (TYPE A & B)
H1N1FLUPCR: NOT DETECTED
Influenza A By PCR: NEGATIVE
Influenza B By PCR: NEGATIVE

## 2015-11-17 LAB — CBC
HCT: 38 % (ref 35.0–47.0)
Hemoglobin: 12.9 g/dL (ref 12.0–16.0)
MCH: 30.3 pg (ref 26.0–34.0)
MCHC: 33.8 g/dL (ref 32.0–36.0)
MCV: 89.7 fL (ref 80.0–100.0)
PLATELETS: 195 10*3/uL (ref 150–440)
RBC: 4.24 MIL/uL (ref 3.80–5.20)
RDW: 13.4 % (ref 11.5–14.5)
WBC: 22.4 10*3/uL — AB (ref 3.6–11.0)

## 2015-11-17 LAB — GLUCOSE, CAPILLARY: Glucose-Capillary: 162 mg/dL — ABNORMAL HIGH (ref 65–99)

## 2015-11-17 MED ORDER — VENLAFAXINE HCL ER 75 MG PO CP24
75.0000 mg | ORAL_CAPSULE | Freq: Every day | ORAL | Status: DC
  Administered 2015-11-18 – 2015-11-20 (×3): 75 mg via ORAL
  Filled 2015-11-17 (×3): qty 1

## 2015-11-17 MED ORDER — PANTOPRAZOLE SODIUM 40 MG PO TBEC
40.0000 mg | DELAYED_RELEASE_TABLET | Freq: Every day | ORAL | Status: DC
  Administered 2015-11-17 – 2015-11-20 (×4): 40 mg via ORAL
  Filled 2015-11-17 (×4): qty 1

## 2015-11-17 MED ORDER — METFORMIN HCL 500 MG PO TABS
1000.0000 mg | ORAL_TABLET | Freq: Every day | ORAL | Status: DC
  Administered 2015-11-18 – 2015-11-20 (×3): 1000 mg via ORAL
  Filled 2015-11-17 (×3): qty 2

## 2015-11-17 MED ORDER — TIOTROPIUM BROMIDE MONOHYDRATE 18 MCG IN CAPS
18.0000 ug | ORAL_CAPSULE | Freq: Every day | RESPIRATORY_TRACT | Status: DC
  Administered 2015-11-17 – 2015-11-20 (×4): 18 ug via RESPIRATORY_TRACT
  Filled 2015-11-17: qty 5

## 2015-11-17 MED ORDER — PRAVASTATIN SODIUM 20 MG PO TABS
20.0000 mg | ORAL_TABLET | Freq: Every day | ORAL | Status: DC
  Administered 2015-11-17 – 2015-11-19 (×3): 20 mg via ORAL
  Filled 2015-11-17 (×3): qty 1

## 2015-11-17 MED ORDER — CLONAZEPAM 1 MG PO TABS
1.0000 mg | ORAL_TABLET | Freq: Two times a day (BID) | ORAL | Status: DC
  Administered 2015-11-17 – 2015-11-20 (×6): 1 mg via ORAL
  Filled 2015-11-17 (×6): qty 1

## 2015-11-17 MED ORDER — INSULIN ASPART 100 UNIT/ML ~~LOC~~ SOLN: 0.0000 [IU] | Freq: Three times a day (TID) | SUBCUTANEOUS | Status: DC

## 2015-11-17 NOTE — Progress Notes (Signed)
Urology Follow Up  Subjective: Low grade temp 99.  Mild flank pain.  Tolerating diet.  UCx GNR, pending. WBC rising.    Anti-infectives: Anti-infectives    Start     Dose/Rate Route Frequency Ordered Stop   11/17/15 1000  cefTRIAXone (ROCEPHIN) 1 g in dextrose 5 % 50 mL IVPB     1 g 100 mL/hr over 30 Minutes Intravenous Every 24 hours 11/16/15 2140     11/16/15 1715  cefTRIAXone (ROCEPHIN) 1 g in dextrose 5 % 50 mL IVPB     1 g 100 mL/hr over 30 Minutes Intravenous  Once 11/16/15 1707 11/16/15 1753      Current Facility-Administered Medications  Medication Dose Route Frequency Provider Last Rate Last Dose  . acetaminophen (TYLENOL) tablet 650 mg  650 mg Oral Q6H Ramiro J Madden-Fuentes, MD   650 mg at 11/17/15 1026  . cefTRIAXone (ROCEPHIN) 1 g in dextrose 5 % 50 mL IVPB  1 g Intravenous Q24H Ramiro J Madden-Fuentes, MD   1 g at 11/17/15 1024  . docusate sodium (COLACE) capsule 100 mg  100 mg Oral BID Delmer Islam Madden-Fuentes, MD   100 mg at 11/17/15 1024  . HYDROmorphone (DILAUDID) injection 0.5 mg  0.5 mg Intravenous Q3H PRN Ramiro J Madden-Fuentes, MD      . lactated ringers infusion   Intravenous Continuous Ramiro J Madden-Fuentes, MD 125 mL/hr at 11/17/15 1027 1 mL at 11/17/15 1027  . [START ON 11/18/2015] metFORMIN (GLUCOPHAGE) tablet 1,000 mg  1,000 mg Oral Q breakfast Festus Aloe, MD      . ondansetron [facility]) injection 4 mg  4 mg Intravenous Q4H PRN Ramiro J Madden-Fuentes, MD      . oxyCODONE (Oxy IR/ROXICODONE) immediate release tablet 5-10 mg  5-10 mg Oral Q4H PRN Delmer Islam Madden-Fuentes, MD   10 mg at 11/17/15 0905     Objective: Vital signs in last 24 hours: Temp:  [97.5 F (36.4 C)-100.1 F (37.8 C)] 98.4 F (36.9 C) (03/06 1224) Pulse Rate:  [73-97] 85 (03/06 1224) Resp:  [16-25] 19 (03/06 1224) BP: (101-144)/(41-97) 111/51 mmHg (03/06 1224) SpO2:  [92 %-100 %] 93 % (03/06 1224) Weight:  [162 lb (73.483 kg)-165 lb 1.6 oz (74.889 kg)] 165 lb 1.6 oz (74.889  kg) (03/05 2141)  Intake/Output from previous day: 03/05 0701 - 03/06 0700 In: 1985 [P.O.:360; I.V.:1625] Out: 475 [Urine:475] Intake/Output this shift: Total I/O In: 877.5 [P.O.:240; I.V.:587.5; IV Piggyback:50] Out: 500 [Urine:500]   Physical Exam  Constitutional: She is oriented to person, place, and time and well-developed, well-nourished, and in no distress.  HENT:  Head: Normocephalic and atraumatic.  Neck: Normal range of motion.  Cardiovascular: Normal rate.   Pulmonary/Chest: Effort normal. No respiratory distress.  Abdominal: Soft. Bowel sounds are normal. She exhibits no distension. There is no tenderness.  Genitourinary:  Foley in place draining clear yellow urine.    Musculoskeletal: Normal range of motion. She exhibits no edema.  Neurological: She is alert and oriented to person, place, and time.  Skin: Skin is warm and dry.  Psychiatric: Affect normal.    Lab Results:   Recent Labs  11/16/15 1456 11/17/15 0557  WBC 20.7* 22.4*  HGB 14.8 12.9  HCT 43.5 38.0  PLT 238 195   BMET  Recent Labs  11/16/15 1456 11/17/15 0557  NA 137 136  K 3.9 4.1  CL 105 105  CO2 25 24  GLUCOSE 174* 162*  BUN 20 21*  CREATININE 1.33* 1.32*  CALCIUM  9.1 7.7*   Studies/Results: Ct Renal Stone Study  11/16/2015  CLINICAL DATA:  Awoke with left flank pain. EXAM: CT ABDOMEN AND PELVIS WITHOUT CONTRAST TECHNIQUE: Multidetector CT imaging of the abdomen and pelvis was performed following the standard protocol without IV contrast. COMPARISON:  None. FINDINGS: Lower chest: Minimal dependent atelectasis at the lung bases. Heart is normal in size. No pleural effusion. Liver: Mild diffusely decreased density consistent with steatosis. No evidence of focal lesion allowing for lack contrast. Hepatobiliary: Gallbladder physiologically distended, no calcified stone. No biliary dilatation. Pancreas: No ductal dilatation or inflammation. Mild motion artifact at this level. Spleen: Normal,  allowing for mild motion artifact. Adrenal glands: No nodule. Kidneys: Obstructing 3 x 6 mm stone in the left proximal ureter (at the level of L2) with proximal hydroureteronephrosis and perinephric stranding. Ureter distal to this is decompressed. No additional nonobstructing stones in either kidney. No right hydronephrosis. Stomach/Bowel: Limited assessment secondary to lack of enteric contrast and mild motion artifact. Stomach physiologically distended with ingested contents. There are no dilated or thickened small bowel loops. Small volume of stool throughout the colon without colonic wall thickening. The appendix is tentatively identified, no periappendiceal or pericecal inflammatory change. Vascular/Lymphatic: No retroperitoneal adenopathy. Abdominal aorta is normal in caliber. Mild to moderate atherosclerosis without aneurysm. Reproductive: Uterus remains in situ.  No adnexal mass. Bladder: Physiologically distended, no wall thickening. No bladder stone. Other: No free air, free fluid, or intra-abdominal fluid collection. Musculoskeletal: There are no acute or suspicious osseous abnormalities. IMPRESSION: Obstructing 3 x 6 mm stone in the left proximal ureter with proximal hydroureteronephrosis. Electronically Signed   By: Jeb Levering M.D.   On: 11/16/2015 18:18     Assessment: s/p Procedure(s): CYSTOSCOPY WITH LEFT STENT PLACEMENT, LEFT RETROGRADE  Plan: Continue ceftriaxone Repeat WBC in aM F/u urine culture results D/c foley   Likely d/c home tomorrow if stable    LOS: 1 day    Hollice Espy 11/17/2015

## 2015-11-17 NOTE — Anesthesia Postprocedure Evaluation (Signed)
Anesthesia Post Note  Patient: Samantha Welch  Procedure(s) Performed: Procedure(s) (LRB): CYSTOSCOPY WITH LEFT STENT PLACEMENT, LEFT RETROGRADE (Left)  Patient location during evaluation: PACU Anesthesia Type: General Level of consciousness: awake and alert Pain management: pain level controlled Vital Signs Assessment: post-procedure vital signs reviewed and stable Respiratory status: spontaneous breathing, nonlabored ventilation, respiratory function stable and patient connected to nasal cannula oxygen Cardiovascular status: blood pressure returned to baseline and stable Postop Assessment: no signs of nausea or vomiting Anesthetic complications: no    Last Vitals:  Filed Vitals:   11/16/15 2330 11/17/15 0408  BP: 101/41 108/45  Pulse: 80 81  Temp: 36.4 C 37.2 C  Resp: 18 18    Last Pain:  Filed Vitals:   11/17/15 0529  PainSc: Asleep                 Precious Haws Pattijo Juste

## 2015-11-17 NOTE — Consult Note (Signed)
[place] [facility] [place] NAME: Samantha Welch    MR#:  [id number]  DATE OF BIRTH:  [birth year]  DATE OF ADMISSION:  11/16/2015  PRIMARY CARE PHYSICIAN: [facility] [place]   REQUESTING/REFERRING PHYSICIAN: Dr. Imagene Riches  CHIEF COMPLAINT:   Chief Complaint  Patient presents with  . Abdominal Pain     Reason for consult - is fever HISTORY OF PRESENT ILLNESS: Samantha Welch  is a 65 y.o. female with a known history of diabetes, anxiety, hyperlipidemia, COPD, smoking- came to the hospital and admitted to urology service with left flank pain found to have a ureteral stone with left ureteral stent is placed by urology and she is on Rocephin IV for UTI and possible pyelonephritis..   Medical consult was called in today as patient again had fever, tachypnea, hypoxia.  Patient denies any new complaints, her pain is now under control after stenting and with pain medications. She denies any cough or shortness of breath. She feels slightly dizzy on getting up and walking up to the bathroom. Denies any diarrhea, nausea, vomiting.  PAST MEDICAL HISTORY:   Past Medical History  Diagnosis Date  . Diabetes mellitus without complication ([place])   . GERD (gastroesophageal reflux disease)   . Anxiety   . Hyperlipemia   . COPD (chronic obstructive pulmonary disease) ([place])   . Tobacco abuse disorder     PAST SURGICAL HISTORY: Past Surgical History  Procedure Laterality Date  . Tubal ligation    . Cystoscopy with stent placement Left 11/16/2015    Procedure: CYSTOSCOPY WITH LEFT STENT PLACEMENT, LEFT RETROGRADE;  Surgeon: Dereck Leep, MD;  Location: [facility] ORS;  Service: Urology;  Laterality: Left;    SOCIAL HISTORY:  Social History  Substance Use Topics  . Smoking status: Current Every Day Smoker -- 0.50 packs/day    Types: Cigarettes  . Smokeless tobacco: Never Used  . Alcohol Use: No    FAMILY HISTORY: Hypertension in mother.  DRUG  ALLERGIES: No Known Allergies  REVIEW OF SYSTEMS:   CONSTITUTIONAL: positive for fever, fatigue or weakness.  EYES: No blurred or double vision.  EARS, NOSE, AND THROAT: No tinnitus or ear pain.  RESPIRATORY: No cough, shortness of breath, wheezing or hemoptysis.  CARDIOVASCULAR: No chest pain, orthopnea, edema.  GASTROINTESTINAL: No nausea, vomiting, diarrhea or abdominal pain.  GENITOURINARY: No dysuria, hematuria.  ENDOCRINE: No polyuria, nocturia,  HEMATOLOGY: No anemia, easy bruising or bleeding SKIN: No rash or lesion. MUSCULOSKELETAL: No joint pain or arthritis.   NEUROLOGIC: No tingling, numbness, weakness.  PSYCHIATRY: No anxiety or depression.   MEDICATIONS AT HOME:  Prior to Admission medications   Medication Sig Start Date End Date Taking? Authorizing Provider  aspirin EC 81 MG tablet Take 81 mg by mouth at bedtime.   Yes Historical Provider, MD  clonazePAM (KLONOPIN) 1 MG tablet Take 1 mg by mouth 2 (two) times daily.   Yes Historical Provider, MD  hydrOXYzine (ATARAX/VISTARIL) 25 MG tablet Take 25 mg by mouth at bedtime. 11/03/15  Yes Historical Provider, MD  metFORMIN (GLUCOPHAGE) 1000 MG tablet Take 1,000 mg by mouth every morning. 10/05/15  Yes Historical Provider, MD  omega-3 acid ethyl esters (LOVAZA) 1 g capsule Take 1-2 g by mouth daily as needed (for cholesterol.).    Yes Historical Provider, MD  omeprazole (PRILOSEC OTC) 20 MG tablet Take 20 mg by mouth daily.   Yes Historical Provider, MD  pravastatin (PRAVACHOL) 20 MG tablet Take 20  mg by mouth at bedtime. 11/03/15  Yes Historical Provider, MD  tiotropium (SPIRIVA) 18 MCG inhalation capsule Place 18 mcg into inhaler and inhale daily.   Yes Historical Provider, MD  venlafaxine XR (EFFEXOR-XR) 75 MG 24 hr capsule Take 75 mg by mouth daily with breakfast.   Yes Historical Provider, MD  ciprofloxacin (CIPRO) 500 MG tablet Take 1 tablet (500 mg total) by mouth every 12 (twelve) hours. 01/24/15   Norval Gable, MD       PHYSICAL EXAMINATION:   VITAL SIGNS: Blood pressure 127/52, pulse 96, temperature 100.5 F (38.1 C), temperature source Oral, resp. rate 30, height 5\' 2"  (1.575 m), weight 74.889 kg (165 lb 1.6 oz), SpO2 86 %.  GENERAL:  65 y.o.-year-old patient lying in the bed with no acute distress.  EYES: Pupils equal, round, reactive to light and accommodation. No scleral icterus. Extraocular muscles intact.  HEENT: Head atraumatic, normocephalic. Oropharynx and nasopharynx clear.  NECK:  Supple, no jugular venous distention. No thyroid enlargement, no tenderness.  LUNGS: Normal breath sounds bilaterally, no wheezing, some crepitation. No use of accessory muscles of respiration.  CARDIOVASCULAR: S1, S2 normal. No murmurs, rubs, or gallops.  ABDOMEN: Soft, mild left flank tender, nondistended. Bowel sounds present. No organomegaly or mass.  EXTREMITIES: No pedal edema, cyanosis, or clubbing.  NEUROLOGIC: Cranial nerves II through XII are intact. Muscle strength 5/5 in all extremities. Sensation intact. Gait not checked.  PSYCHIATRIC: The patient is alert and oriented x 3.  SKIN: No obvious rash, lesion, or ulcer.   LABORATORY PANEL:   CBC  Recent Labs Lab 11/16/15 1456 11/17/15 0557  WBC 20.7* 22.4*  HGB 14.8 12.9  HCT 43.5 38.0  PLT 238 195  MCV 89.6 89.7  MCH 30.6 30.3  MCHC 34.1 33.8  RDW 13.0 13.4  LYMPHSABS 0.6*  --   MONOABS 0.3  --   EOSABS 0.0  --   BASOSABS 0.1  --    ------------------------------------------------------------------------------------------------------------------  Chemistries   Recent Labs Lab 11/16/15 1456 11/17/15 0557  NA 137 136  K 3.9 4.1  CL 105 105  CO2 25 24  GLUCOSE 174* 162*  BUN 20 21*  CREATININE 1.33* 1.32*  CALCIUM 9.1 7.7*  AST 23  --   ALT 21  --   ALKPHOS 48  --   BILITOT 1.1  --    ------------------------------------------------------------------------------------------------------------------ estimated creatinine  clearance is 39.7 mL/min (by C-G formula based on Cr of 1.32). ------------------------------------------------------------------------------------------------------------------ No results for input(s): TSH, T4TOTAL, T3FREE, THYROIDAB in the last 72 hours.  Invalid input(s): FREET3   Coagulation profile No results for input(s): INR, PROTIME in the last 168 hours. ------------------------------------------------------------------------------------------------------------------- No results for input(s): DDIMER in the last 72 hours. -------------------------------------------------------------------------------------------------------------------  Cardiac Enzymes No results for input(s): CKMB, TROPONINI, MYOGLOBIN in the last 168 hours.  Invalid input(s): CK ------------------------------------------------------------------------------------------------------------------ Invalid input(s): POCBNP  ---------------------------------------------------------------------------------------------------------------  Urinalysis    Component Value Date/Time   COLORURINE YELLOW* 11/16/2015 1456   APPEARANCEUR CLOUDY* 11/16/2015 1456   LABSPEC 1.015 11/16/2015 1456   PHURINE 5.0 11/16/2015 1456   GLUCOSEU NEGATIVE 11/16/2015 1456   HGBUR 3+* 11/16/2015 1456   BILIRUBINUR NEGATIVE 11/16/2015 1456   KETONESUR NEGATIVE 11/16/2015 1456   PROTEINUR 30* 11/16/2015 1456   NITRITE NEGATIVE 11/16/2015 1456   LEUKOCYTESUR 2+* 11/16/2015 1456     RADIOLOGY: Ct Renal Stone Study  11/16/2015  CLINICAL DATA:  Awoke with left flank pain. EXAM: CT ABDOMEN AND PELVIS WITHOUT CONTRAST TECHNIQUE: Multidetector CT imaging of the abdomen and  pelvis was performed following the standard protocol without IV contrast. COMPARISON:  None. FINDINGS: Lower chest: Minimal dependent atelectasis at the lung bases. Heart is normal in size. No pleural effusion. Liver: Mild diffusely decreased density consistent with  steatosis. No evidence of focal lesion allowing for lack contrast. Hepatobiliary: Gallbladder physiologically distended, no calcified stone. No biliary dilatation. Pancreas: No ductal dilatation or inflammation. Mild motion artifact at this level. Spleen: Normal, allowing for mild motion artifact. Adrenal glands: No nodule. Kidneys: Obstructing 3 x 6 mm stone in the left proximal ureter (at the level of L2) with proximal hydroureteronephrosis and perinephric stranding. Ureter distal to this is decompressed. No additional nonobstructing stones in either kidney. No right hydronephrosis. Stomach/Bowel: Limited assessment secondary to lack of enteric contrast and mild motion artifact. Stomach physiologically distended with ingested contents. There are no dilated or thickened small bowel loops. Small volume of stool throughout the colon without colonic wall thickening. The appendix is tentatively identified, no periappendiceal or pericecal inflammatory change. Vascular/Lymphatic: No retroperitoneal adenopathy. Abdominal aorta is normal in caliber. Mild to moderate atherosclerosis without aneurysm. Reproductive: Uterus remains in situ.  No adnexal mass. Bladder: Physiologically distended, no wall thickening. No bladder stone. Other: No free air, free fluid, or intra-abdominal fluid collection. Musculoskeletal: There are no acute or suspicious osseous abnormalities. IMPRESSION: Obstructing 3 x 6 mm stone in the left proximal ureter with proximal hydroureteronephrosis. Electronically Signed   By: Jeb Levering M.D.   On: 11/16/2015 18:18    EKG: Orders placed or performed during the hospital encounter of 11/16/15  . ED EKG  . ED EKG  . EKG 12-Lead  . EKG 12-Lead    IMPRESSION AND PLAN:  * Sepsis- with fever, tachypnea, elevated white cell count, hypoxia.   Patient is being treated for pyelonephritis by urology with IV Rocephin. Pending urine culture.     I will check for influenza swab, blood culture,  chest x-ray    Currently no need to add additional antibiotics, unless her blood pressure starts dropping.     * Acute hypoxic respiratory failure   Check chest x-ray for any presence of pneumonia.   No active wheezing.   I decreased IV fluid is patient is now tolerating diet properly.  * Diabetes  Continue metformin, added sliding scale coverage.  * COPD  No active wheezing, continue Spiriva.  * Hyperlipidemia   Continue pravastatin.   All the records are reviewed and case discussed with ED provider. Management plans discussed with the patient, family and they are in agreement.  CODE STATUS: Full code Code Status History    This patient does not have a recorded code status. Please follow your organizational policy for patients in this situation.       TOTAL TIME TAKING CARE OF THIS PATIENT: 50 minutes.    Vaughan Basta M.D on 11/17/2015   Between 7am to 6pm - Pager - [phone]  After 6pm go to [email or web address] - password [email or web address]  Office  [phone]  CC: Primary care physician; [place]   Note: This dictation was prepared with Dragon dictation along with smaller phrase technology. Any transcriptional errors that result from this process are unintentional.

## 2015-11-17 NOTE — Progress Notes (Signed)
11/17/2015 4:52 PM  NT Kristen informed me pt had a fever, low Sp02, and tachypnea.  BP was 132/50, temp was 103.1, pulse 89, and RR 26 upon assessment.  Started pt on 2L 02 via nasal cannula.  Notified Dr. Junious Silk who says he will consult with hospitalist.  650 mg PO Tylenol given minutes beforehand.  Will continue to monitor and assess.  Dola Argyle, RN

## 2015-11-17 NOTE — Progress Notes (Signed)
   Pt given Tylenol and placed on O2 [place]. She looks well. Denies CP or SOB.   PE: A&Ox3, NAD, sitting in bed, watching TV.  CV - RRR Lungs - nl RR rate and effort Abd - soft, NT  A/P - fever - likely pyelonephritis. I spoke with Dr. Anselm Jungling about an hour ago and appreciate hospitalist expertise to eval patient. Pt stable now, but wanted to get hospitalist eval and in case pt needs higher care later.   Left ureteral stone - s/p stent.   UTI - urine Cx growing > 100k GNR. On rocephin.

## 2015-11-17 NOTE — Progress Notes (Signed)
Spoke with dr. Melynda Ripple regarding pt elevated temp. MD wants to use alternative measures to bring down temp. (Ice packs to underarms, remove excess blankets, etc..) will continue to monitor.

## 2015-11-18 DIAGNOSIS — N201 Calculus of ureter: Secondary | ICD-10-CM

## 2015-11-18 DIAGNOSIS — R109 Unspecified abdominal pain: Secondary | ICD-10-CM | POA: Insufficient documentation

## 2015-11-18 DIAGNOSIS — N390 Urinary tract infection, site not specified: Secondary | ICD-10-CM | POA: Insufficient documentation

## 2015-11-18 LAB — BASIC METABOLIC PANEL
ANION GAP: 7 (ref 5–15)
BUN: 18 mg/dL (ref 6–20)
CHLORIDE: 106 mmol/L (ref 101–111)
CO2: 23 mmol/L (ref 22–32)
Calcium: 7.9 mg/dL — ABNORMAL LOW (ref 8.9–10.3)
Creatinine, Ser: 1.06 mg/dL — ABNORMAL HIGH (ref 0.44–1.00)
GFR calc Af Amer: >60 mL/min (ref >60)
GFR calc non Af Amer: 53 mL/min — ABNORMAL LOW (ref >60)
GLUCOSE: 171 mg/dL — AB (ref 65–99)
POTASSIUM: 3.6 mmol/L (ref 3.5–5.1)
Sodium: 136 mmol/L (ref 135–145)

## 2015-11-18 LAB — URINE CULTURE

## 2015-11-18 LAB — GLUCOSE, CAPILLARY
GLUCOSE-CAPILLARY: 167 mg/dL — AB (ref 65–99)
Glucose-Capillary: 172 mg/dL — ABNORMAL HIGH (ref 65–99)
Glucose-Capillary: 174 mg/dL — ABNORMAL HIGH (ref 65–99)
Glucose-Capillary: 149 mg/dL — ABNORMAL HIGH (ref 65–99)

## 2015-11-18 LAB — CBC
HEMATOCRIT: 35.3 % (ref 35.0–47.0)
HEMOGLOBIN: 12.3 g/dL (ref 12.0–16.0)
MCH: 30.8 pg (ref 26.0–34.0)
MCHC: 34.8 g/dL (ref 32.0–36.0)
MCV: 88.6 fL (ref 80.0–100.0)
Platelets: 148 10*3/uL — ABNORMAL LOW (ref 150–440)
RBC: 3.98 MIL/uL (ref 3.80–5.20)
RDW: 13.5 % (ref 11.5–14.5)
WBC: 12.7 10*3/uL — ABNORMAL HIGH (ref 3.6–11.0)

## 2015-11-18 MED ORDER — CEPHALEXIN 250 MG PO CAPS
500.0000 mg | ORAL_CAPSULE | Freq: Two times a day (BID) | ORAL | Status: DC
  Administered 2015-11-19 – 2015-11-20 (×3): 500 mg via ORAL
  Filled 2015-11-18 (×3): qty 2

## 2015-11-18 MED ORDER — CEPHALEXIN 250 MG PO CAPS: 500.0000 mg | ORAL_CAPSULE | Freq: Two times a day (BID) | ORAL | Status: DC

## 2015-11-18 NOTE — Progress Notes (Signed)
Urology Follow Up  Subjective: Spiked a fever overnight with some mild hypoxia. Medicine consult. Flu test negative, chest x-ray with slight effusion but no infiltrate. Urine culture growing Klebsiella sensitive to ceftriaxone.  Leukocytosis and creatinine trending to baseline.  Anti-infectives: Anti-infectives    Start     Dose/Rate Route Frequency Ordered Stop   11/19/15 1000  cephALEXin (KEFLEX) capsule 500 mg  Status:  Discontinued     500 mg Oral Every 12 hours 11/18/15 1430 11/18/15 1431   11/19/15 1000  cephALEXin (KEFLEX) capsule 500 mg     500 mg Oral Every 12 hours 11/18/15 1432     11/17/15 1000  cefTRIAXone (ROCEPHIN) 1 g in dextrose 5 % 50 mL IVPB  Status:  Discontinued     1 g 100 mL/hr over 30 Minutes Intravenous Every 24 hours 11/16/15 2140 11/18/15 1430   11/16/15 1715  cefTRIAXone (ROCEPHIN) 1 g in dextrose 5 % 50 mL IVPB     1 g 100 mL/hr over 30 Minutes Intravenous  Once 11/16/15 1707 11/16/15 1753      Current Facility-Administered Medications  Medication Dose Route Frequency Provider Last Rate Last Dose  . acetaminophen (TYLENOL) tablet 650 mg  650 mg Oral Q6H Ramiro J Madden-Fuentes, MD   650 mg at 11/18/15 1547  . [START ON 11/19/2015] cephALEXin (KEFLEX) capsule 500 mg  500 mg Oral Q12H Vipul Shah, MD      . clonazePAM Bobbye [place]) tablet 1 mg  1 mg Oral BID Festus Aloe, MD   1 mg at 11/18/15 0948  . docusate sodium (COLACE) capsule 100 mg  100 mg Oral BID Delmer Islam Madden-Fuentes, MD   100 mg at 11/18/15 0948  . HYDROmorphone (DILAUDID) injection 0.5 mg  0.5 mg Intravenous Q3H PRN Ramiro J Madden-Fuentes, MD      . insulin aspart (novoLOG) injection 0-9 Units  0-9 Units Subcutaneous TID WC Vaughan Basta, MD   0 Units at 11/18/15 0800  . lactated ringers infusion   Intravenous Continuous Vaughan Basta, MD 60 mL/hr at 11/18/15 0514    . metFORMIN (GLUCOPHAGE) tablet 1,000 mg  1,000 mg Oral Q breakfast Festus Aloe, MD   1,000 mg at 11/18/15  0948  . ondansetron (ZOFRAN) injection 4 mg  4 mg Intravenous Q4H PRN Delmer Islam Madden-Fuentes, MD   4 mg at 11/18/15 1558  . oxyCODONE (Oxy IR/ROXICODONE) immediate release tablet 5-10 mg  5-10 mg Oral Q4H PRN Delmer Islam Madden-Fuentes, MD   5 mg at 11/18/15 1311  . pantoprazole (PROTONIX) EC tablet 40 mg  40 mg Oral Daily Festus Aloe, MD   40 mg at 11/18/15 0949  . pravastatin (PRAVACHOL) tablet 20 mg  20 mg Oral QHS Festus Aloe, MD   20 mg at 11/17/15 2116  . tiotropium (SPIRIVA) inhalation capsule 18 mcg  18 mcg Inhalation Daily Festus Aloe, MD   18 mcg at 11/18/15 [phone]  . venlafaxine XR (EFFEXOR-XR) 24 hr capsule 75 mg  75 mg Oral Q breakfast Festus Aloe, MD   75 mg at 11/18/15 0949     Objective: Vital signs in last 24 hours: Temp:  [97.6 F (36.4 C)-101.5 F (38.6 C)] 99.5 F (37.5 C) (03/07 1656) Pulse Rate:  [73-105] 105 (03/07 1656) Resp:  [15-24] 16 (03/07 1203) BP: (117-145)/(46-64) 144/64 mmHg (03/07 1656) SpO2:  [91 %-97 %] 91 % (03/07 1656)  Intake/Output from previous day: 03/06 0701 - 03/07 0700 In: 4372 [P.O.:1440; I.V.:2882; IV Piggyback:50] Out: 1700 [Urine:1700] Intake/Output this shift: Total  I/O In: 617 [P.O.:120; I.V.:497] Out: 1050 [Urine:1050]   Physical Exam  Constitutional: She is oriented to person, place, and time and well-developed, well-nourished, and in no distress.  HENT:  Head: Normocephalic and atraumatic.  Neck: Normal range of motion.  Cardiovascular: Normal rate.   Pulmonary/Chest: Effort normal. No respiratory distress.  Abdominal: Soft. Bowel sounds are normal. She exhibits no distension. There is no tenderness.  Musculoskeletal: Normal range of motion. She exhibits no edema.  Neurological: She is alert and oriented to person, place, and time.  Skin: Skin is warm and dry.  Psychiatric: Affect normal.    Lab Results:   Recent Labs  11/17/15 0557 11/18/15 0539  WBC 22.4* 12.7*  HGB 12.9 12.3  HCT 38.0 35.3   PLT 195 148*   BMET  Recent Labs  11/17/15 0557 11/18/15 0539  NA 136 136  K 4.1 3.6  CL 105 106  CO2 24 23  GLUCOSE 162* 171*  BUN 21* 18  CREATININE 1.32* 1.06*  CALCIUM 7.7* 7.9*   Studies/Results: Dg Chest 2 View  11/17/2015  CLINICAL DATA:  Fever for 1 day to 102 degrees EXAM: CHEST  2 VIEW COMPARISON:  11/16/2015 FINDINGS: Limited inspiratory effect. Mild cardiac enlargement. Vascular pattern normal. Blunting of the posterior costophrenic angle on the left suggests the presence of a small effusion. Lungs appear clear. IMPRESSION: An infiltrate is not appreciated but there is a small left pleural effusion. Electronically Signed   By: Skipper Cliche M.D.   On: 11/17/2015 21:59   Ct Renal Stone Study  11/16/2015  CLINICAL DATA:  Awoke with left flank pain. EXAM: CT ABDOMEN AND PELVIS WITHOUT CONTRAST TECHNIQUE: Multidetector CT imaging of the abdomen and pelvis was performed following the standard protocol without IV contrast. COMPARISON:  None. FINDINGS: Lower chest: Minimal dependent atelectasis at the lung bases. Heart is normal in size. No pleural effusion. Liver: Mild diffusely decreased density consistent with steatosis. No evidence of focal lesion allowing for lack contrast. Hepatobiliary: Gallbladder physiologically distended, no calcified stone. No biliary dilatation. Pancreas: No ductal dilatation or inflammation. Mild motion artifact at this level. Spleen: Normal, allowing for mild motion artifact. Adrenal glands: No nodule. Kidneys: Obstructing 3 x 6 mm stone in the left proximal ureter (at the level of L2) with proximal hydroureteronephrosis and perinephric stranding. Ureter distal to this is decompressed. No additional nonobstructing stones in either kidney. No right hydronephrosis. Stomach/Bowel: Limited assessment secondary to lack of enteric contrast and mild motion artifact. Stomach physiologically distended with ingested contents. There are no dilated or thickened small  bowel loops. Small volume of stool throughout the colon without colonic wall thickening. The appendix is tentatively identified, no periappendiceal or pericecal inflammatory change. Vascular/Lymphatic: No retroperitoneal adenopathy. Abdominal aorta is normal in caliber. Mild to moderate atherosclerosis without aneurysm. Reproductive: Uterus remains in situ.  No adnexal mass. Bladder: Physiologically distended, no wall thickening. No bladder stone. Other: No free air, free fluid, or intra-abdominal fluid collection. Musculoskeletal: There are no acute or suspicious osseous abnormalities. IMPRESSION: Obstructing 3 x 6 mm stone in the left proximal ureter with proximal hydroureteronephrosis. Electronically Signed   By: Jeb Levering M.D.   On: 11/16/2015 18:18     Assessment: s/p Procedure(s): POD2 CYSTOSCOPY WITH LEFT STENT PLACEMENT, LEFT RETROGRADE  Klebsiella pyelonephritis  Plan: transition to oral antibiotics Wean oxygen Pulmonary toilet F/u medicine rec  Likely d/c home tomorrow if stable    LOS: 2 days    Samantha Welch 11/18/2015

## 2015-11-18 NOTE — Progress Notes (Signed)
[place] [facility] [place] NAME: Samantha Welch    MR#:  [id number]  DATE OF BIRTH:  Sep 29, 1950  SUBJECTIVE:  CHIEF COMPLAINT:   Chief Complaint  Patient presents with  . Abdominal Pain  feeling better. Urine growing Klebsiella.  REVIEW OF SYSTEMS:  Review of Systems  Constitutional: Negative for fever, weight loss, malaise/fatigue and diaphoresis.  HENT: Negative for ear discharge, ear pain, hearing loss, nosebleeds, sore throat and tinnitus.   Eyes: Negative for blurred vision and pain.  Respiratory: Negative for cough, hemoptysis, shortness of breath and wheezing.   Cardiovascular: Negative for chest pain, palpitations, orthopnea and leg swelling.  Gastrointestinal: Negative for heartburn, nausea, vomiting, abdominal pain, diarrhea, constipation and blood in stool.  Genitourinary: Negative for dysuria, urgency and frequency.  Musculoskeletal: Negative for myalgias and back pain.  Skin: Negative for itching and rash.  Neurological: Positive for weakness. Negative for dizziness, tingling, tremors, focal weakness, seizures and headaches.  Psychiatric/Behavioral: Negative for depression. The patient is not nervous/anxious.     DRUG ALLERGIES:  No Known Allergies VITALS:  Blood pressure 117/51, pulse 73, temperature 97.6 F (36.4 C), temperature source Oral, resp. rate 16, height 5\' 2"  (1.575 m), weight 74.889 kg (165 lb 1.6 oz), SpO2 95 %. PHYSICAL EXAMINATION:  Physical Exam  Constitutional: She is oriented to person, place, and time and well-developed, well-nourished, and in no distress.  HENT:  Head: Normocephalic and atraumatic.  Eyes: Conjunctivae and EOM are normal. Pupils are equal, round, and reactive to light.  Neck: Normal range of motion. Neck supple. No tracheal deviation present. No thyromegaly present.  Cardiovascular: Normal rate, regular rhythm and normal heart sounds.   Pulmonary/Chest: Effort normal and breath sounds  normal. No respiratory distress. She has no wheezes. She exhibits no tenderness.  Abdominal: Soft. Bowel sounds are normal. She exhibits no distension. There is no tenderness.  Musculoskeletal: Normal range of motion.  Neurological: She is alert and oriented to person, place, and time. No cranial nerve deficit.  Skin: Skin is warm and dry. No rash noted.  Psychiatric: Mood and affect normal.   LABORATORY PANEL:   CBC  Recent Labs Lab 11/18/15 0539  WBC 12.7*  HGB 12.3  HCT 35.3  PLT 148*   ------------------------------------------------------------------------------------------------------------------ Chemistries   Recent Labs Lab 11/16/15 1456  11/18/15 0539  NA 137  < > 136  K 3.9  < > 3.6  CL 105  < > 106  CO2 25  < > 23  GLUCOSE 174*  < > 171*  BUN 20  < > 18  CREATININE 1.33*  < > 1.06*  CALCIUM 9.1  < > 7.9*  AST 23  --   --   ALT 21  --   --   ALKPHOS 48  --   --   BILITOT 1.1  --   --   < > = values in this interval not displayed. RADIOLOGY:  Dg Chest 2 View  11/17/2015  CLINICAL DATA:  Fever for 1 day to 102 degrees EXAM: CHEST  2 VIEW COMPARISON:  11/16/2015 FINDINGS: Limited inspiratory effect. Mild cardiac enlargement. Vascular pattern normal. Blunting of the posterior costophrenic angle on the left suggests the presence of a small effusion. Lungs appear clear. IMPRESSION: An infiltrate is not appreciated but there is a small left pleural effusion. Electronically Signed   By: Skipper Cliche M.D.   On: 11/17/2015 21:59   ASSESSMENT AND PLAN:  * Sepsis-  Present on  admission - had fever, tachypnea, elevated white cell count, hypoxia.  Patient is being treated for pyelonephritis - will switch IV Rocephin to PO keflex based on urine culture which is growing Klebsiella -Neg influenza swab, pending blood culture, chest x-ray showed small left pleural effusion   * Acute hypoxic respiratory failure  chest x-ray - not showing any pneumonia.  No active  wheezing.  decreased IV fluid as patient is now tolerating diet properly.  * Diabetes Continue metformin, added sliding scale coverage.  * COPD No active wheezing, continue Spiriva.  * Hyperlipidemia  Continue pravastatin.     All the records are reviewed and case discussed with Care Management/Social Worker. Management plans discussed with the patient, family and they are in agreement.  CODE STATUS: FULL CODE  TOTAL TIME TAKING CARE OF THIS PATIENT: 35 minutes.   More than 50% of the time was spent in counseling/coordination of care: YES  POSSIBLE D/C IN 1-2 DAYS, DEPENDING ON CLINICAL CONDITION. AND PER PRIMARY TEAM.   Max Sane M.D on 11/18/2015 at 3:35 PM  Between 7am to 6pm - Pager - [phone]  After 6pm go to [email or web address] - password [email or web address] [facility]  Office  [phone]  CC: Primary care physician; [place]  Note: This dictation was prepared with Dragon dictation along with smaller phrase technology. Any transcriptional errors that result from this process are unintentional.

## 2015-11-19 LAB — GLUCOSE, CAPILLARY
GLUCOSE-CAPILLARY: 137 mg/dL — AB (ref 65–99)
GLUCOSE-CAPILLARY: 142 mg/dL — AB (ref 65–99)
Glucose-Capillary: 175 mg/dL — ABNORMAL HIGH (ref 65–99)
Glucose-Capillary: 187 mg/dL — ABNORMAL HIGH (ref 65–99)

## 2015-11-19 LAB — CBC
HEMATOCRIT: 35.6 % (ref 35.0–47.0)
Hemoglobin: 12.3 g/dL (ref 12.0–16.0)
MCH: 30.5 pg (ref 26.0–34.0)
MCHC: 34.6 g/dL (ref 32.0–36.0)
MCV: 88.3 fL (ref 80.0–100.0)
PLATELETS: 146 10*3/uL — AB (ref 150–440)
RBC: 4.03 MIL/uL (ref 3.80–5.20)
RDW: 13.6 % (ref 11.5–14.5)
WBC: 11 10*3/uL (ref 3.6–11.0)

## 2015-11-19 LAB — BASIC METABOLIC PANEL
ANION GAP: 5 (ref 5–15)
BUN: 14 mg/dL (ref 6–20)
CHLORIDE: 105 mmol/L (ref 101–111)
CO2: 28 mmol/L (ref 22–32)
Calcium: 8.5 mg/dL — ABNORMAL LOW (ref 8.9–10.3)
Creatinine, Ser: 0.96 mg/dL (ref 0.44–1.00)
GFR calc Af Amer: >60 mL/min (ref >60)
GLUCOSE: 164 mg/dL — AB (ref 65–99)
POTASSIUM: 3.9 mmol/L (ref 3.5–5.1)
Sodium: 138 mmol/L (ref 135–145)

## 2015-11-19 LAB — URINE CULTURE
Culture: >=100000
SPECIAL REQUESTS: NORMAL

## 2015-11-19 MED ORDER — SENNA 8.6 MG PO TABS
1.0000 | ORAL_TABLET | Freq: Every day | ORAL | Status: DC
  Administered 2015-11-19 – 2015-11-20 (×2): 8.6 mg via ORAL
  Filled 2015-11-19 (×2): qty 1

## 2015-11-19 MED ORDER — POLYETHYLENE GLYCOL 3350 17 G PO PACK
17.0000 g | PACK | Freq: Every day | ORAL | Status: DC
  Administered 2015-11-19 – 2015-11-20 (×2): 17 g via ORAL
  Filled 2015-11-19 (×2): qty 1

## 2015-11-19 MED ORDER — ACETAMINOPHEN 325 MG PO TABS
650.0000 mg | ORAL_TABLET | Freq: Four times a day (QID) | ORAL | Status: DC | PRN
  Administered 2015-11-19 – 2015-11-20 (×2): 650 mg via ORAL
  Filled 2015-11-19 (×2): qty 2

## 2015-11-19 NOTE — Progress Notes (Signed)
[facility] at [place] NAME: Samantha Welch    MR#:  [id number]  DATE OF BIRTH:  Mar 01, 1951  SUBJECTIVE:  Patient doing well this morning. Plans for discharge tomorrow.  REVIEW OF SYSTEMS:  Review of Systems  Constitutional: Negative for fever, weight loss, malaise/fatigue and diaphoresis.  HENT: Negative for ear discharge, ear pain, hearing loss, nosebleeds, sore throat and tinnitus.   Eyes: Negative for blurred vision and pain.  Respiratory: Negative for cough, hemoptysis, shortness of breath and wheezing.   Cardiovascular: Negative for chest pain, palpitations, orthopnea and leg swelling.  Gastrointestinal: Negative for heartburn, nausea, vomiting, abdominal pain, diarrhea, constipation and blood in stool.  Genitourinary: Negative for dysuria, urgency and frequency.  Musculoskeletal: Negative for myalgias, back pain and falls.  Skin: Negative for itching and rash.  Neurological: Negative for dizziness, tingling, tremors, focal weakness, seizures and headaches.  Psychiatric/Behavioral: Negative for depression. The patient is not nervous/anxious.     DRUG ALLERGIES:  No Known Allergies VITALS:  Blood pressure 130/67, pulse 76, temperature 98.4 F (36.9 C), temperature source Oral, resp. rate 18, height 5\' 2"  (1.575 m), weight 74.889 kg (165 lb 1.6 oz), SpO2 95 %. PHYSICAL EXAMINATION:  Physical Exam  Constitutional: She is oriented to person, place, and time and well-developed, well-nourished, and in no distress.  HENT:  Head: Normocephalic and atraumatic.  Eyes: Conjunctivae and EOM are normal. Pupils are equal, round, and reactive to light.  Neck: Normal range of motion. Neck supple. No tracheal deviation present. No thyromegaly present.  Cardiovascular: Normal rate, regular rhythm and normal heart sounds.   Pulmonary/Chest: Effort normal. No respiratory distress. She has no wheezes. She exhibits no tenderness.  crackles right  base  Abdominal: Soft. Bowel sounds are normal. She exhibits no distension. There is no tenderness.  Musculoskeletal: Normal range of motion.  Neurological: She is alert and oriented to person, place, and time. No cranial nerve deficit.  Skin: Skin is warm and dry. No rash noted.  Psychiatric: Mood and affect normal.   LABORATORY PANEL:   CBC  Recent Labs Lab 11/19/15 0609  WBC 11.0  HGB 12.3  HCT 35.6  PLT 146*   ------------------------------------------------------------------------------------------------------------------ Chemistries   Recent Labs Lab 11/16/15 1456  11/19/15 0609  NA 137  < > 138  K 3.9  < > 3.9  CL 105  < > 105  CO2 25  < > 28  GLUCOSE 174*  < > 164*  BUN 20  < > 14  CREATININE 1.33*  < > 0.96  CALCIUM 9.1  < > 8.5*  AST 23  --   --   ALT 21  --   --   ALKPHOS 48  --   --   BILITOT 1.1  --   --   < > = values in this interval not displayed. RADIOLOGY:  No results found. ASSESSMENT AND PLAN:   65 year old female who presented with pain and found to have a left ureteral stone.   * Sepsis-  Present on admission - had fever, tachypnea, elevated white cell count, hypoxia.  Patient is being treated for pyelonephritis - continue PO keflex based on urine culture which is growing Klebsiella -Neg influenza swab, pending blood culture, chest x-ray showed small left pleural effusion   * Acute hypoxic respiratory failure: This is due to atelectasis. Continue incentive spirometer. Chest x-ray did not show evidence of pneumonia.  Wean off oxygen as tolerated. * Diabetes Continue metformin, sliding scale  coverage.  * COPD No active wheezing, continue Spiriva.  * Hyperlipidemia  Continue pravastatin.      Management plans discussed with the patient and  family and they are in agreement.  CODE STATUS: FULL CODE  TOTAL TIME TAKING CARE OF THIS PATIENT: 25 minutes.     POSSIBLE D/C  Tomorrow   Esau Fridman M.D on 11/19/2015 at 11:13  AM  Between 7am to 6pm - Pager - [phone]  After 6pm go to [email or web address] - password [email or web address] [facility]  Office  [phone]  CC: Primary care physician; [place]  Note: This dictation was prepared with Dragon dictation along with smaller phrase technology. Any transcriptional errors that result from this process are unintentional.

## 2015-11-19 NOTE — Progress Notes (Signed)
Urology Follow Up  Subjective: Spiked a fever overnight twice overnight. No more SOB. Tolerating diet with occasional nausea.   Anti-infectives: Anti-infectives    Start     Dose/Rate Route Frequency Ordered Stop   11/19/15 1000  cephALEXin (KEFLEX) capsule 500 mg  Status:  Discontinued     500 mg Oral Every 12 hours 11/18/15 1430 11/18/15 1431   11/19/15 1000  cephALEXin (KEFLEX) capsule 500 mg     500 mg Oral Every 12 hours 11/18/15 1432     11/17/15 1000  cefTRIAXone (ROCEPHIN) 1 g in dextrose 5 % 50 mL IVPB  Status:  Discontinued     1 g 100 mL/hr over 30 Minutes Intravenous Every 24 hours 11/16/15 2140 11/18/15 1430   11/16/15 1715  cefTRIAXone (ROCEPHIN) 1 g in dextrose 5 % 50 mL IVPB     1 g 100 mL/hr over 30 Minutes Intravenous  Once 11/16/15 1707 11/16/15 1753      Current Facility-Administered Medications  Medication Dose Route Frequency Provider Last Rate Last Dose  . acetaminophen (TYLENOL) tablet 650 mg  650 mg Oral Q6H Ramiro J Madden-Fuentes, MD   650 mg at 11/19/15 0424  . cephALEXin (KEFLEX) capsule 500 mg  500 mg Oral Q12H Vipul Shah, MD      . clonazePAM Bobbye Charleston) tablet 1 mg  1 mg Oral BID Festus Aloe, MD   1 mg at 11/18/15 2056  . docusate sodium (COLACE) capsule 100 mg  100 mg Oral BID Delmer Islam Madden-Fuentes, MD   100 mg at 11/18/15 2056  . HYDROmorphone (DILAUDID) injection 0.5 mg  0.5 mg Intravenous Q3H PRN Ramiro J Madden-Fuentes, MD      . insulin aspart (novoLOG) injection 0-9 Units  0-9 Units Subcutaneous TID WC Vaughan Basta, MD   0 Units at 11/18/15 0800  . lactated ringers infusion   Intravenous Continuous Vaughan Basta, MD 60 mL/hr at 11/18/15 2056    . metFORMIN (GLUCOPHAGE) tablet 1,000 mg  1,000 mg Oral Q breakfast Festus Aloe, MD   1,000 mg at 11/18/15 0948  . ondansetron (ZOFRAN) injection 4 mg  4 mg Intravenous Q4H PRN Delmer Islam Madden-Fuentes, MD   4 mg at 11/18/15 2057  . oxyCODONE (Oxy IR/ROXICODONE) immediate release  tablet 5-10 mg  5-10 mg Oral Q4H PRN Delmer Islam Madden-Fuentes, MD   10 mg at 11/19/15 0111  . pantoprazole (PROTONIX) EC tablet 40 mg  40 mg Oral Daily Festus Aloe, MD   40 mg at 11/18/15 0949  . pravastatin (PRAVACHOL) tablet 20 mg  20 mg Oral QHS Festus Aloe, MD   20 mg at 11/18/15 2057  . tiotropium (SPIRIVA) inhalation capsule 18 mcg  18 mcg Inhalation Daily Festus Aloe, MD   18 mcg at 11/18/15 [phone]  . venlafaxine XR (EFFEXOR-XR) 24 hr capsule 75 mg  75 mg Oral Q breakfast Festus Aloe, MD   75 mg at 11/18/15 0949     Objective: Vital signs in last 24 hours: Temp:  [97.6 F (36.4 C)-102.2 F (39 C)] 98.5 F (36.9 C) (03/08 0537) Pulse Rate:  [73-105] 75 (03/08 0537) Resp:  [15-20] 18 (03/08 0537) BP: (117-154)/(51-64) 126/59 mmHg (03/08 0537) SpO2:  [90 %-95 %] 95 % (03/08 0537)  Intake/Output from previous day: 03/07 0701 - 03/08 0700 In: 2198 [P.O.:960; I.V.:1238] Out: 3000 [Urine:3000] Intake/Output this shift:     Physical Exam  Constitutional: She is oriented to person, place, and time and well-developed, well-nourished, and in no distress.  HENT:  Head: Normocephalic and atraumatic.  Neck: Normal range of motion.  Cardiovascular: Normal rate.   Pulmonary/Chest: Effort normal. No respiratory distress.  Abdominal: Soft. Bowel sounds are normal. She exhibits no distension. There is no tenderness.  Musculoskeletal: Normal range of motion. She exhibits no edema.  Neurological: She is alert and oriented to person, place, and time.  Skin: Skin is warm and dry.  Psychiatric: Affect normal.    Lab Results:   Recent Labs  11/18/15 0539 11/19/15 0609  WBC 12.7* 11.0  HGB 12.3 12.3  HCT 35.3 35.6  PLT 148* 146*   BMET  Recent Labs  11/18/15 0539 11/19/15 0609  NA 136 138  K 3.6 3.9  CL 106 105  CO2 23 28  GLUCOSE 171* 164*  BUN 18 14  CREATININE 1.06* 0.96  CALCIUM 7.9* 8.5*   Studies/Results: Dg Chest 2 View  11/17/2015  CLINICAL  DATA:  Fever for 1 day to 102 degrees EXAM: CHEST  2 VIEW COMPARISON:  11/16/2015 FINDINGS: Limited inspiratory effect. Mild cardiac enlargement. Vascular pattern normal. Blunting of the posterior costophrenic angle on the left suggests the presence of a small effusion. Lungs appear clear. IMPRESSION: An infiltrate is not appreciated but there is a small left pleural effusion. Electronically Signed   By: Skipper Cliche M.D.   On: 11/17/2015 21:59     Assessment: s/p Procedure(s): POD3 CYSTOSCOPY WITH LEFT STENT PLACEMENT, LEFT RETROGRADE  Klebsiella pyelonephritis - sensitive to keflex  I discussed with the patient and daughter that spiking intermittent fevers is to be expected with pyelonephritis. WBC has normalized. Hypoxia resolved. Patient and daughter are requesting to stay one more night. They are agree to anticipated discharge Thursday morning.  Plan: Continue course of keflex Monitor fever, O2 sat Plan for d/c home Thursday morning    LOS: 3 days    [place] 11/19/2015

## 2015-11-20 LAB — BASIC METABOLIC PANEL
ANION GAP: 3 — AB (ref 5–15)
BUN: 13 mg/dL (ref 6–20)
CHLORIDE: 102 mmol/L (ref 101–111)
CO2: 31 mmol/L (ref 22–32)
Calcium: 8.2 mg/dL — ABNORMAL LOW (ref 8.9–10.3)
Creatinine, Ser: 0.9 mg/dL (ref 0.44–1.00)
GFR calc Af Amer: >60 mL/min (ref >60)
GLUCOSE: 169 mg/dL — AB (ref 65–99)
POTASSIUM: 3.8 mmol/L (ref 3.5–5.1)
Sodium: 136 mmol/L (ref 135–145)

## 2015-11-20 LAB — CBC
HCT: 35.1 % (ref 35.0–47.0)
HEMOGLOBIN: 12.2 g/dL (ref 12.0–16.0)
MCH: 30.9 pg (ref 26.0–34.0)
MCHC: 34.8 g/dL (ref 32.0–36.0)
MCV: 88.9 fL (ref 80.0–100.0)
PLATELETS: 177 10*3/uL (ref 150–440)
RBC: 3.95 MIL/uL (ref 3.80–5.20)
RDW: 13.7 % (ref 11.5–14.5)
WBC: 8.4 10*3/uL (ref 3.6–11.0)

## 2015-11-20 LAB — GLUCOSE, CAPILLARY: GLUCOSE-CAPILLARY: 144 mg/dL — AB (ref 65–99)

## 2015-11-20 MED ORDER — OXYCODONE HCL 5 MG PO TABS: 5.0000 mg | ORAL_TABLET | ORAL | Status: DC | PRN

## 2015-11-20 MED ORDER — DOCUSATE SODIUM 100 MG PO CAPS: 100.0000 mg | ORAL_CAPSULE | Freq: Two times a day (BID) | ORAL | Status: DC

## 2015-11-20 MED ORDER — CEPHALEXIN 500 MG PO CAPS: 500.0000 mg | ORAL_CAPSULE | Freq: Two times a day (BID) | ORAL | Status: DC

## 2015-11-20 NOTE — Discharge Instructions (Signed)
Ureteral Stent  Patient Education A stent is a hollow tube that maintains patency until healing can take place or an obstruction is relieved. It allows urine to flow from the kidney to the bladder.  Indications for stenting include: Relief of ureteral obstruction (stones, cancer, stricture) and provide drainage; Promote healing of the ureter by providing internal support after a ureteral procedure  Prevent potential complications by helping place a guidewire into the ureter; Assist in dilating the ureter before the next ureteroscopy; Bypass obstructions, either from internal or external causes;  Procedure: Under general anesthesia in a cystoscopy suite, the ureteroscope is introduced into the bladder through the urethra and then up into the desired ureter.   Stent Removal: Remove in 2-7 days after ureteroscopy in uncomplicated cases; Remove in 1-2 weeks in cases of ureteral perforation or persisting concern of obstruction; A string may be left on your stent and you will be instructed by your provider when to gently pull the string to remove your stent at home.  Can be removed in the office with topical anesthesia with a flexible ureteroscope and grasper; Can be removed in the cysto suite under anesthesia for patients unable to tolerate topical anesthesia; If required for long term use (extrinsic compression by tumor, stricture), stents should be changed every 6 months  What to expect while stent is in place Blood in the urine intermittently while stent is in place. Usually it is most severe the first few days after surgery, but it can persist the entire time that the stent is in place. Symptoms of urinary frequency and urgency that is caused by the lower end of the stent coiling into and irritating the bladder.  Precautions: Do not have sexual intercourse while stent is in place or participate in other strenuous activity.  Please notify your physicians office as soon as possible  if: your stent falls out. It is very rare but if the stent comes out, please rinse it with water, place it in a ziplock bag and bring it with you to your appointment. you are experiencing extreme pain, prolonged nausea/vomiting or fever >100.5 [facility] [address] [place], [place] [address] [phone]        Ureteral Stent Implantation, Care After Refer to this sheet in the next few weeks. These instructions provide you with information on caring for yourself after your procedure. Your health care provider may also give you more specific instructions. Your treatment has been planned according to current medical practices, but problems sometimes occur. Call your health care provider if you have any problems or questions after your procedure. WHAT TO EXPECT AFTER THE PROCEDURE You should be back to normal activity within 48 hours after the procedure. Nausea and vomiting may occur and are commonly the result of anesthesia. It is common to experience sharp pain in the back or lower abdomen and penis with voiding. This is caused by movement of the ends of the stent with the act of urinating.It usually goes away within minutes after you have stopped urinating. HOME CARE INSTRUCTIONS Make sure to drink plenty of fluids. You may have small amounts of bleeding, causing your urine to be red. This is normal. Certain movements may trigger pain or a feeling that you need to urinate. You may be given medicines to prevent infection or bladder spasms. Be sure to take all medicines as directed. Only take over-the-counter or prescription medicines for pain, discomfort, or fever as directed by your health  care provider. Do not take aspirin, as this can make bleeding worse. Your stent will be left in until the blockage is resolved. This may take 2 weeks or longer, depending on the reason for stent implantation. You may have an X-ray exam to make sure your ureter is  open and that the stent has not moved out of position (migrated). The stent can be removed by your health care provider in the office. Medicines may be given for comfort while the stent is being removed. Be sure to keep all follow-up appointments so your health care provider can check that you are healing properly. SEEK MEDICAL CARE IF:  You experience increasing pain.  Your pain medicine is not working. SEEK IMMEDIATE MEDICAL CARE IF:  Your urine is dark red or has blood clots.  You are leaking urine (incontinent).  You have a fever, chills, feeling sick to your stomach (nausea), or vomiting.  Your pain is not relieved by pain medicine.  The end of the stent comes out of the urethra.  You are unable to urinate.   This information is not intended to replace advice given to you by your health care provider. Make sure you discuss any questions you have with your health care provider.   Document Released: 05/02/2013 Document Revised: 09/04/2013 Document Reviewed: 03/14/2015 Elsevier Interactive Patient Education [employer].

## 2015-11-20 NOTE — Progress Notes (Signed)
Pt stable. IV removed. D/c instructions given and education provided. Signed prescriptions verified and given. Pt states she understands instructions. Pt dressed and escorted out by staff. Driven home by family.  

## 2015-11-20 NOTE — Progress Notes (Signed)
[facility] at [place] NAME: Samantha Welch    MR#:  [id number]  DATE OF BIRTH:  February 23, 1951  SUBJECTIVE:  Patient With fever last night. Reports no back pain or urinary symptoms. Reports no shortness of breath or chest pain  REVIEW OF SYSTEMS:  Review of Systems  Constitutional: Positive for fever. Negative for weight loss, malaise/fatigue and diaphoresis.  HENT: Negative for ear discharge, ear pain, hearing loss, nosebleeds, sore throat and tinnitus.   Eyes: Negative for blurred vision and pain.  Respiratory: Negative for cough, hemoptysis, shortness of breath and wheezing.   Cardiovascular: Negative for chest pain, palpitations, orthopnea and leg swelling.  Gastrointestinal: Negative for heartburn, nausea, vomiting, abdominal pain, diarrhea, constipation and blood in stool.  Genitourinary: Negative for dysuria, urgency and frequency.  Musculoskeletal: Negative for myalgias, back pain and falls.  Skin: Negative for itching and rash.  Neurological: Negative for dizziness, tingling, tremors, focal weakness, seizures and headaches.  Psychiatric/Behavioral: Negative for depression. The patient is not nervous/anxious.     DRUG ALLERGIES:  No Known Allergies VITALS:  Blood pressure 127/63, pulse 85, temperature 98.4 F (36.9 C), temperature source Axillary, resp. rate 19, height 5\' 2"  (1.575 m), weight 74.889 kg (165 lb 1.6 oz), SpO2 91 %. PHYSICAL EXAMINATION:  Physical Exam  Constitutional: She is oriented to person, place, and time and well-developed, well-nourished, and in no distress.  HENT:  Head: Normocephalic and atraumatic.  Eyes: Conjunctivae and EOM are normal. Pupils are equal, round, and reactive to light.  Neck: Normal range of motion. Neck supple. No tracheal deviation present. No thyromegaly present.  Cardiovascular: Normal rate, regular rhythm and normal heart sounds.   Pulmonary/Chest: Effort normal and breath sounds  normal. No respiratory distress. She has no wheezes. She exhibits no tenderness.  Abdominal: Soft. Bowel sounds are normal. She exhibits no distension. There is no tenderness.  Musculoskeletal: Normal range of motion.  Neurological: She is alert and oriented to person, place, and time. No cranial nerve deficit.  Skin: Skin is warm and dry. No rash noted.  Psychiatric: Mood and affect normal.   LABORATORY PANEL:   CBC  Recent Labs Lab 11/20/15 0502  WBC 8.4  HGB 12.2  HCT 35.1  PLT 177   ------------------------------------------------------------------------------------------------------------------ Chemistries   Recent Labs Lab 11/16/15 1456  11/20/15 0502  NA 137  < > 136  K 3.9  < > 3.8  CL 105  < > 102  CO2 25  < > 31  GLUCOSE 174*  < > 169*  BUN 20  < > 13  CREATININE 1.33*  < > 0.90  CALCIUM 9.1  < > 8.2*  AST 23  --   --   ALT 21  --   --   ALKPHOS 48  --   --   BILITOT 1.1  --   --   < > = values in this interval not displayed. RADIOLOGY:  No results found. ASSESSMENT AND PLAN:   65 year old female who presented with pain and found to have a left ureteral stone.   * Sepsis-  Present on admission - had fever, tachypnea, elevated white cell count, hypoxia.  Patient is being treated for pyelonephritis - continue PO keflex based on urine culture which is growing Klebsiella -Neg influenza swab, pending blood culture, chest x-ray showed small left pleural effusion  Ongoing fevers may be related pyelonephritis.  * Acute hypoxic respiratory failure: This is due to atelectasis.  Chest x-ray did  not show evidence of pneumonia. She is off oxygen * Diabetes Continue metformin, sliding scale coverage.  * COPD No active wheezing, continue Spiriva.  * Hyperlipidemia  Continue pravastatin.   I will sign off please call if you have further questions thank you   Management plans discussed with the patient and  family and they are in agreement.  CODE  STATUS: FULL CODE  TOTAL TIME TAKING CARE OF THIS PATIENT: 25 minutes.     POSSIBLE D/C  today by primary team  Samantha Welch M.D on 11/20/2015 at 10:57 AM  Between 7am to 6pm - Pager - [phone]  After 6pm go to [email or web address] - password [email or web address] [facility]  Office  [phone]  CC: Primary care physician; [place]  Note: This dictation was prepared with Dragon dictation along with smaller phrase technology. Any transcriptional errors that result from this process are unintentional.

## 2015-11-20 NOTE — Progress Notes (Signed)
Urology Follow Up  Subjective: Spiked a fever overnight twice overnight. No more SOB. Tolerating diet with occasional nausea.  Reports feeling better. She is maintaining her oxygen saturation on room air. She was ambulatory yesterday and tolerated it well.  Anti-infectives: Anti-infectives    Start     Dose/Rate Route Frequency Ordered Stop   11/20/15 0000  cephALEXin (KEFLEX) 500 MG capsule     500 mg Oral Every 12 hours 11/20/15 0804     11/19/15 1000  cephALEXin (KEFLEX) capsule 500 mg  Status:  Discontinued     500 mg Oral Every 12 hours 11/18/15 1430 11/18/15 1431   11/19/15 1000  cephALEXin (KEFLEX) capsule 500 mg     500 mg Oral Every 12 hours 11/18/15 1432     11/17/15 1000  cefTRIAXone (ROCEPHIN) 1 g in dextrose 5 % 50 mL IVPB  Status:  Discontinued     1 g 100 mL/hr over 30 Minutes Intravenous Every 24 hours 11/16/15 2140 11/18/15 1430   11/16/15 1715  cefTRIAXone (ROCEPHIN) 1 g in dextrose 5 % 50 mL IVPB     1 g 100 mL/hr over 30 Minutes Intravenous  Once 11/16/15 1707 11/16/15 1753      Current Facility-Administered Medications  Medication Dose Route Frequency Provider Last Rate Last Dose  . acetaminophen (TYLENOL) tablet 650 mg  650 mg Oral Q6H PRN Bettey Costa, MD   650 mg at 11/19/15 2315  . cephALEXin (KEFLEX) capsule 500 mg  500 mg Oral Q12H Max Sane, MD   500 mg at 11/19/15 2315  . clonazePAM (KLONOPIN) tablet 1 mg  1 mg Oral BID Festus Aloe, MD   1 mg at 11/19/15 2315  . docusate sodium (COLACE) capsule 100 mg  100 mg Oral BID Delmer Islam Madden-Fuentes, MD   100 mg at 11/19/15 2315  . HYDROmorphone (DILAUDID) injection 0.5 mg  0.5 mg Intravenous Q3H PRN Ramiro J Madden-Fuentes, MD      . insulin aspart (novoLOG) injection 0-9 Units  0-9 Units Subcutaneous TID WC Vaughan Basta, MD   0 Units at 11/18/15 0800  . metFORMIN (GLUCOPHAGE) tablet 1,000 mg  1,000 mg Oral Q breakfast Festus Aloe, MD   1,000 mg at 11/20/15 [id number]  . ondansetron (ZOFRAN) injection  4 mg  4 mg Intravenous Q4H PRN Delmer Islam Madden-Fuentes, MD   4 mg at 11/19/15 1847  . oxyCODONE (Oxy IR/ROXICODONE) immediate release tablet 5-10 mg  5-10 mg Oral Q4H PRN Delmer Islam Madden-Fuentes, MD   5 mg at 11/20/15 0812  . pantoprazole (PROTONIX) EC tablet 40 mg  40 mg Oral Daily Festus Aloe, MD   40 mg at 11/19/15 [id number]  . polyethylene glycol (MIRALAX / GLYCOLAX) packet 17 g  17 g Oral Daily Sital Mody, MD   17 g at 11/19/15 1008  . pravastatin (PRAVACHOL) tablet 20 mg  20 mg Oral QHS Festus Aloe, MD   20 mg at 11/19/15 2315  . senna (SENOKOT) tablet 8.6 mg  1 tablet Oral Daily Sital Mody, MD   8.6 mg at 11/19/15 1008  . tiotropium (SPIRIVA) inhalation capsule 18 mcg  18 mcg Inhalation Daily Festus Aloe, MD   18 mcg at 11/20/15 0816  . venlafaxine XR (EFFEXOR-XR) 24 hr capsule 75 mg  75 mg Oral Q breakfast Festus Aloe, MD   75 mg at 11/20/15 [id number]     Objective: Vital signs in last 24 hours: Temp:  [98.4 F (36.9 C)-101.4 F (38.6 C)] 98.5 F (36.9 C) (03/09  0426) Pulse Rate:  [72-86] 72 (03/09 0426) Resp:  [18-20] 20 (03/09 0426) BP: (109-133)/(50-70) 109/70 mmHg (03/09 0426) SpO2:  [90 %-96 %] 94 % (03/09 0426)  Intake/Output from previous day: 03/08 0701 - 03/09 0700 In: 720 [P.O.:720] Out: 250 [Urine:250] Intake/Output this shift:     Physical Exam  Constitutional: She is oriented to person, place, and time and well-developed, well-nourished, and in no distress.  HENT:  Head: Normocephalic and atraumatic.  Neck: Normal range of motion.  Cardiovascular: Normal rate.   Pulmonary/Chest: Effort normal. No respiratory distress.  Abdominal: Soft. Bowel sounds are normal. She exhibits no distension. There is no tenderness.  Musculoskeletal: Normal range of motion. She exhibits no edema.  Neurological: She is alert and oriented to person, place, and time.  Skin: Skin is warm and dry.  Psychiatric: Affect normal.    Lab Results:   Recent Labs   11/19/15 0609 11/20/15 0502  WBC 11.0 8.4  HGB 12.3 12.2  HCT 35.6 35.1  PLT 146* 177   BMET  Recent Labs  11/19/15 0609 11/20/15 0502  NA 138 136  K 3.9 3.8  CL 105 102  CO2 28 31  GLUCOSE 164* 169*  BUN 14 13  CREATININE 0.96 0.90  CALCIUM 8.5* 8.2*   Studies/Results: No results found.   Assessment: s/p Procedure(s): POD3 CYSTOSCOPY WITH LEFT STENT PLACEMENT, LEFT RETROGRADE  Klebsiella pyelonephritis - sensitive to keflex  I reinforced with the patient and daughter that spiking intermittent fevers is to be expected with pyelonephritis. WBC has normalized. Hypoxia resolved.   Plan: -Continue course of keflex as outpatient for 10 days. Prescription sent to patient's pharmacy. -Prescription for Oxycodone printed and placed in patient's chart. -Plan for d/c home today with follow-up at [facility] in 1 week for a recheck and surgical planning for removal of her remaining ureteral stone via URS.    LOS: 4 days    Herbert Moors 11/20/2015

## 2015-11-20 NOTE — Care Management Important Message (Signed)
Important Message  Patient Details  Name: Samantha Welch MRN: [id number] Date of Birth: May 31, 1951   Medicare Important Message Given:  Yes    Juliann Pulse A Sama Arauz 11/20/2015, 9:56 AM

## 2015-11-20 NOTE — Progress Notes (Signed)
Pt refused earlier to leave and be escorted out by staff. Pt stated she needed more rest before she leaves. RN told patient the wheelchair will arrive soon to escort her out. Patient now getting clean and dressed and will be escorted out by staff shortly.

## 2015-11-20 NOTE — Progress Notes (Signed)
Spoke to Dr. Louis Meckel confirming patient is okay to be discharged today even with her temperature of 101.4 last night. MD acknowledge. Pt okay to leave and educated if fevers persist to see MD.

## 2015-11-20 NOTE — Discharge Summary (Signed)
Date of admission: 11/16/2015  Date of discharge: 11/20/2015  Admission diagnosis: Left Ureteral Stone; UTI  Discharge diagnosis: Pyelonephritis; ureteral Stone  Secondary diagnoses:  Patient Active Problem List   Diagnosis Date Noted  . Left flank pain   . UTI (lower urinary tract infection)   . Left ureteral stone 11/16/2015    History and Physical: For full details, please see admission history and physical. Briefly, Samantha Welch is a 65 y.o. year old patient with an obstructing left ureteral stone with resulting pyelonephritis s/p ureteral stent placement.   Hospital Course: Patient tolerated the procedure well.  She was then transferred to the floor after an uneventful PACU stay.  Her hospital course was uncomplicated.  On POD#4 she had met discharge criteria: was eating a regular diet, was up and ambulating independently,  pain was well controlled, was voiding without a catheter, and was ready to for discharge.   Laboratory values:   Recent Labs  11/18/15 0539 11/19/15 0609 11/20/15 0502  WBC 12.7* 11.0 8.4  HGB 12.3 12.3 12.2  HCT 35.3 35.6 35.1    Recent Labs  11/18/15 0539 11/19/15 0609 11/20/15 0502  NA 136 138 136  K 3.6 3.9 3.8  CL 106 105 102  CO2 _0 GLUCOSE 171* 164* 169*  BUN _1 CREATININE 1.06* 0.96 0.90  CALCIUM 7.9* 8.5* 8.2*   No results for input(s): LABPT, INR in the last 72 hours. No results for input(s): LABURIN in the last 72 hours. Results for orders placed or performed during the hospital encounter of 11/16/15  Urine culture     Status: None   Collection Time: 11/16/15  2:56 PM  Result Value Ref Range Status   Specimen Description URINE, RANDOM  Final   Special Requests NONE  Final   Culture KLEBSIELLA PNEUMONIAE  Final   Report Status 11/18/2015 FINAL  Final   Organism ID, Bacteria KLEBSIELLA PNEUMONIAE  Final      Susceptibility   Klebsiella pneumoniae - MIC*    AMPICILLIN 16 RESISTANT Resistant     CEFAZOLIN <=4  SENSITIVE Sensitive     CEFTRIAXONE <=1 SENSITIVE Sensitive     CIPROFLOXACIN <=0.25 SENSITIVE Sensitive     GENTAMICIN <=1 SENSITIVE Sensitive     IMIPENEM <=0.25 SENSITIVE Sensitive     NITROFURANTOIN <=16 SENSITIVE Sensitive     TRIMETH/SULFA <=20 SENSITIVE Sensitive     AMPICILLIN/SULBACTAM 4 SENSITIVE Sensitive     PIP/TAZO <=4 SENSITIVE Sensitive     Extended ESBL NEGATIVE Sensitive     * KLEBSIELLA PNEUMONIAE  Urine culture     Status: None   Collection Time: 11/16/15  9:02 PM  Result Value Ref Range Status   Specimen Description URINE, CATHETERIZED  Final   Special Requests 1723 1 gm rocephine Normal  Final   Culture >=100,000 COLONIES/mL KLEBSIELLA PNEUMONIAE  Final   Report Status 11/19/2015 FINAL  Final   Organism ID, Bacteria KLEBSIELLA PNEUMONIAE  Final      Susceptibility   Klebsiella pneumoniae - MIC*    AMPICILLIN 16 RESISTANT Resistant     CEFAZOLIN <=4 SENSITIVE Sensitive     CEFTRIAXONE <=1 SENSITIVE Sensitive     CIPROFLOXACIN <=0.25 SENSITIVE Sensitive     GENTAMICIN <=1 SENSITIVE Sensitive     IMIPENEM <=0.25 SENSITIVE Sensitive     NITROFURANTOIN <=16 SENSITIVE Sensitive     TRIMETH/SULFA <=20 SENSITIVE Sensitive     AMPICILLIN/SULBACTAM 4 SENSITIVE Sensitive     PIP/TAZO <=4 SENSITIVE  Sensitive     Extended ESBL NEGATIVE Sensitive     * >=100,000 COLONIES/mL KLEBSIELLA PNEUMONIAE  CULTURE, BLOOD (ROUTINE X 2) w Reflex to PCR ID Panel     Status: None (Preliminary result)   Collection Time: 11/17/15  7:47 PM  Result Value Ref Range Status   Specimen Description BLOOD LEFT WRIST  Final   Special Requests   Final    BOTTLES DRAWN AEROBIC AND ANAEROBIC  AERO 5CC ANA 2CC   Culture NO GROWTH 2 DAYS  Final   Report Status PENDING  Incomplete  CULTURE, BLOOD (ROUTINE X 2) w Reflex to PCR ID Panel     Status: None (Preliminary result)   Collection Time: 11/17/15  7:54 PM  Result Value Ref Range Status   Specimen Description BLOOD RIGHT WRIST  Final    Special Requests BOTTLES DRAWN AEROBIC AND ANAEROBIC  1CC  Final   Culture NO GROWTH 2 DAYS  Final   Report Status PENDING  Incomplete    Disposition: Home  Discharge instruction: The patient was instructed to be ambulatory but told to refrain from heavy lifting, strenuous activity, or driving.   Discharge medications:   Medication List    STOP taking these medications        ciprofloxacin 500 MG tablet  Commonly known as:  CIPRO      TAKE these medications        aspirin EC 81 MG tablet  Take 81 mg by mouth at bedtime.     cephALEXin 500 MG capsule  Commonly known as:  KEFLEX  Take 1 capsule (500 mg total) by mouth every 12 (twelve) hours.     clonazePAM 1 MG tablet  Commonly known as:  KLONOPIN  Take 1 mg by mouth 2 (two) times daily.     docusate sodium 100 MG capsule  Commonly known as:  COLACE  Take 1 capsule (100 mg total) by mouth 2 (two) times daily.     hydrOXYzine 25 MG tablet  Commonly known as:  ATARAX/VISTARIL  Take 25 mg by mouth at bedtime.     metFORMIN 1000 MG tablet  Commonly known as:  GLUCOPHAGE  Take 1,000 mg by mouth every morning.     omega-3 acid ethyl esters 1 g capsule  Commonly known as:  LOVAZA  Take 1-2 g by mouth daily as needed (for cholesterol.).     omeprazole 20 MG tablet  Commonly known as:  PRILOSEC OTC  Take 20 mg by mouth daily.     oxyCODONE 5 MG immediate release tablet  Commonly known as:  Oxy IR/ROXICODONE  Take 1-2 tablets (5-10 mg total) by mouth every 4 (four) hours as needed (1 tablet for pain 4-7/10, 2 tablets for pain 8-10/10).     pravastatin 20 MG tablet  Commonly known as:  PRAVACHOL  Take 20 mg by mouth at bedtime.     tiotropium 18 MCG inhalation capsule  Commonly known as:  SPIRIVA  Place 18 mcg into inhaler and inhale daily.     venlafaxine XR 75 MG 24 hr capsule  Commonly known as:  EFFEXOR-XR  Take 75 mg by mouth daily with breakfast.        Followup:      Follow-up Information    Follow  up with [facility]. Schedule an appointment as soon as possible for a visit in 1 week.   Why:  for recheck and surgical planning

## 2015-11-22 LAB — CULTURE, BLOOD (ROUTINE X 2)
CULTURE: NO GROWTH
CULTURE: NO GROWTH

## 2015-12-01 ENCOUNTER — Ambulatory Visit (INDEPENDENT_AMBULATORY_CARE_PROVIDER_SITE_OTHER): Payer: Medicare Other | Admitting: Urology

## 2015-12-01 ENCOUNTER — Telehealth: Payer: Self-pay | Admitting: Radiology

## 2015-12-01 ENCOUNTER — Encounter: Payer: Self-pay | Admitting: Urology

## 2015-12-01 VITALS — BP 120/72 | HR 71 | Ht 62.0 in | Wt 166.0 lb

## 2015-12-01 DIAGNOSIS — R3129 Other microscopic hematuria: Secondary | ICD-10-CM | POA: Insufficient documentation

## 2015-12-01 DIAGNOSIS — N132 Hydronephrosis with renal and ureteral calculous obstruction: Secondary | ICD-10-CM | POA: Insufficient documentation

## 2015-12-01 DIAGNOSIS — N201 Calculus of ureter: Secondary | ICD-10-CM

## 2015-12-01 LAB — URINALYSIS, COMPLETE
Bilirubin, UA: NEGATIVE
Glucose, UA: NEGATIVE
Ketones, UA: NEGATIVE
Nitrite, UA: NEGATIVE
PH UA: 5.5 (ref 5.0–7.5)
Specific Gravity, UA: 1.02 (ref 1.005–1.030)
Urobilinogen, Ur: 0.2 mg/dL (ref 0.2–1.0)

## 2015-12-01 LAB — MICROSCOPIC EXAMINATION: RBC, UA: >30 /hpf — ABNORMAL HIGH (ref 0–?)

## 2015-12-01 MED ORDER — ONDANSETRON 4 MG PO TBDP: 4.0000 mg | ORAL_TABLET | Freq: Three times a day (TID) | ORAL | Status: DC | PRN

## 2015-12-01 MED ORDER — CEPHALEXIN 500 MG PO CAPS: 500.0000 mg | ORAL_CAPSULE | Freq: Two times a day (BID) | ORAL | Status: DC

## 2015-12-01 MED ORDER — OXYCODONE HCL 5 MG PO TABS: 5.0000 mg | ORAL_TABLET | ORAL | Status: DC | PRN

## 2015-12-01 NOTE — Telephone Encounter (Signed)
Notified pt of surgery scheduled 12/15/15, pre-admit testing appt on 3/21 @1 :45 & to call Friday prior to surgery for arrival time to [facility]. Pt voices understanding.

## 2015-12-01 NOTE — Progress Notes (Signed)
12/01/2015 12:07 PM   Samantha Welch February 21, 1951 [id number]  Referring provider: [place] [place] [address] [place], [place] [address]  Chief Complaint  Patient presents with  . Nephrolithiasis    New Patient    HPI: Patient is a 65 year old Caucasian female who presents today for further discussion regarding her left obstructing ureteral stone.  Background history Patient presented to [facility] emergency room on 11/16/2015 complaining of left flank and abdominal pain.  She was experiencing nausea and vomiting and general malaise with this pain.  Her friend reportedly concerned for chills.  Her WBCs per prior field for 20.7 upon admission and a CT stone study noted a left proximal obstructing ureteral stone.  It was decided at that time for the patient to undergo emergent left ureteral stent placement for decompression.  She was then admitted for IV antibiotics, cultures and monitoring.  She was discharged on the fourth day of admission.  She does not have a prior history of stones.  Her daughter has had ESWL in the past.   Today, she is having stent discomfort.  She is having frequency, urgency and incontinence.  She is still experiencing nausea and left-sided flank pain for which she needs to take narcotic pain medication to control.  She denies any fevers, chills or recent vomiting. She is having dark-colored urine, but that is expected with a ureteral stent.  Her UA today greater than 30 RBC's/hpf, 11-30 WBC's/hpf and many bacteria.  She is just finishing her antibiotic.  Urine is sent for culture.    I have reviewed her CT scan images with the patient.  She has not passed a distinct stone at this time.    PMH: Past Medical History  Diagnosis Date  . Diabetes mellitus without complication ([place])   . GERD (gastroesophageal reflux disease)   . Anxiety   . Hyperlipemia   . COPD (chronic obstructive pulmonary  disease) ([place])   . Tobacco abuse disorder   . Depression   . Stomach ulcer     Surgical History: Past Surgical History  Procedure Laterality Date  . Tubal ligation    . Cystoscopy with stent placement Left 11/16/2015    Procedure: CYSTOSCOPY WITH LEFT STENT PLACEMENT, LEFT RETROGRADE;  Surgeon: Dereck Leep, MD;  Location: [facility] ORS;  Service: Urology;  Laterality: Left;    Home Medications:    Medication List       This list is accurate as of: 12/01/15 12:07 PM.  Always use your most recent med list.               aspirin EC 81 MG tablet  Take 81 mg by mouth at bedtime.     cephALEXin 500 MG capsule  Commonly known as:  KEFLEX  Take 1 capsule (500 mg total) by mouth every 12 (twelve) hours.     clonazePAM 1 MG tablet  Commonly known as:  KLONOPIN  Take 1 mg by mouth 2 (two) times daily.     docusate sodium 100 MG capsule  Commonly known as:  COLACE  Take 1 capsule (100 mg total) by mouth 2 (two) times daily.     hydrOXYzine 25 MG tablet  Commonly known as:  ATARAX/VISTARIL  Take 25 mg by mouth at bedtime.     metFORMIN 1000 MG tablet  Commonly known as:  GLUCOPHAGE  Take 1,000 mg by mouth every morning.     omega-3 acid  ethyl esters 1 g capsule  Commonly known as:  LOVAZA  Take 1-2 g by mouth daily as needed (for cholesterol.).     omeprazole 20 MG tablet  Commonly known as:  PRILOSEC OTC  Take 20 mg by mouth daily.     ondansetron 4 MG disintegrating tablet  Commonly known as:  ZOFRAN ODT  Take 1 tablet (4 mg total) by mouth every 8 (eight) hours as needed for nausea or vomiting.     oxyCODONE 5 MG immediate release tablet  Commonly known as:  Oxy IR/ROXICODONE  Take 1-2 tablets (5-10 mg total) by mouth every 4 (four) hours as needed (1 tablet for pain 4-7/10, 2 tablets for pain 8-10/10).     pravastatin 20 MG tablet  Commonly known as:  PRAVACHOL  Take 20 mg by mouth at bedtime.     tiotropium 18 MCG inhalation capsule  Commonly known as:   SPIRIVA  Place 18 mcg into inhaler and inhale daily.     venlafaxine XR 75 MG 24 hr capsule  Commonly known as:  EFFEXOR-XR  Take 75 mg by mouth daily with breakfast.        Allergies: No Known Allergies  Family History: Family History  Problem Relation Age of Onset  . Bladder Cancer Neg Hx   . Colon cancer Brother   . Kidney cancer Neg Hx   . Prostate cancer Neg Hx     Social History:  reports that she has quit smoking. Her smoking use included Cigarettes. She has never used smokeless tobacco. She reports that she does not drink alcohol or use illicit drugs.  ROS: UROLOGY Frequent Urination?: Yes Hard to postpone urination?: Yes Burning/pain with urination?: No Get up at night to urinate?: Yes Leakage of urine?: Yes Urine stream starts and stops?: No Trouble starting stream?: No Do you have to strain to urinate?: No Blood in urine?: Yes Urinary tract infection?: Yes Sexually transmitted disease?: No Injury to kidneys or bladder?: No Painful intercourse?: No Weak stream?: No Currently pregnant?: No Vaginal bleeding?: No Last menstrual period?: n  Gastrointestinal Nausea?: No Vomiting?: No Indigestion/heartburn?: No Diarrhea?: No Constipation?: No  Constitutional Fever: No Night sweats?: Yes Weight loss?: No Fatigue?: No  Skin Skin rash/lesions?: No Itching?: No  Eyes Blurred vision?: No Double vision?: No  Ears/Nose/Throat Sore throat?: No Sinus problems?: No  Hematologic/Lymphatic Swollen glands?: No Easy bruising?: No  Cardiovascular Leg swelling?: No Chest pain?: No  Respiratory Cough?: No Shortness of breath?: No  Endocrine Excessive thirst?: No  Musculoskeletal Back pain?: No Joint pain?: No  Neurological Headaches?: No Dizziness?: No  Psychologic Depression?: Yes Anxiety?: Yes  Physical Exam: BP 120/72 mmHg  Pulse 71  Ht 5\' 2"  (1.575 m)  Wt 166 lb (75.297 kg)  BMI 30.35 kg/m2  Constitutional: Well nourished.  Alert and oriented, No acute distress. HEENT: [place] AT, moist mucus membranes. Trachea midline, no masses. Cardiovascular: No clubbing, cyanosis, or edema. Respiratory: Normal respiratory effort, no increased work of breathing. GI: Abdomen is soft, non tender, non distended, no abdominal masses. Liver and spleen not palpable.  No hernias appreciated.  Stool sample for occult testing is not indicated.   GU: No CVA tenderness.  No bladder fullness or masses.   Skin: No rashes, bruises or suspicious lesions. Lymph: No cervical or inguinal adenopathy. Neurologic: Grossly intact, no focal deficits, moving all 4 extremities. Psychiatric: Normal mood and affect.  Laboratory Data: Lab Results  Component Value Date   WBC 8.4 11/20/2015  HGB 12.2 11/20/2015   HCT 35.1 11/20/2015   MCV 88.9 11/20/2015   PLT 177 11/20/2015    Lab Results  Component Value Date   CREATININE 0.90 11/20/2015    Lab Results  Component Value Date   AST 23 11/16/2015   Lab Results  Component Value Date   ALT 21 11/16/2015    Urinalysis Results for orders placed or performed in visit on 12/01/15  Microscopic Examination  Result Value Ref Range   WBC, UA 11-30 (A) 0 -  5 /hpf   RBC, UA >30 (H) 0 -  2 /hpf   Epithelial Cells (non renal) 0-10 0 - 10 /hpf   Bacteria, UA Many (A) None seen/Few  Urinalysis, Complete  Result Value Ref Range   Specific Gravity, UA 1.020 1.005 - 1.030   pH, UA 5.5 5.0 - 7.5   Color, UA Yellow Yellow   Appearance Ur Cloudy (A) Clear   Leukocytes, UA 2+ (A) Negative   Protein, UA 1+ (A) Negative/Trace   Glucose, UA Negative Negative   Ketones, UA Negative Negative   RBC, UA 3+ (A) Negative   Bilirubin, UA Negative Negative   Urobilinogen, Ur 0.2 0.2 - 1.0 mg/dL   Nitrite, UA Negative Negative   Microscopic Examination See below:     Pertinent Imaging: CLINICAL DATA: Awoke with left flank pain.  EXAM: CT ABDOMEN AND PELVIS WITHOUT  CONTRAST  TECHNIQUE: Multidetector CT imaging of the abdomen and pelvis was performed following the standard protocol without IV contrast.  COMPARISON: None.  FINDINGS: Lower chest: Minimal dependent atelectasis at the lung bases. Heart is normal in size. No pleural effusion.  Liver: Mild diffusely decreased density consistent with steatosis. No evidence of focal lesion allowing for lack contrast.  Hepatobiliary: Gallbladder physiologically distended, no calcified stone. No biliary dilatation.  Pancreas: No ductal dilatation or inflammation. Mild motion artifact at this level.  Spleen: Normal, allowing for mild motion artifact.  Adrenal glands: No nodule.  Kidneys: Obstructing 3 x 6 mm stone in the left proximal ureter (at the level of L2) with proximal hydroureteronephrosis and perinephric stranding. Ureter distal to this is decompressed. No additional nonobstructing stones in either kidney. No right hydronephrosis.  Stomach/Bowel: Limited assessment secondary to lack of enteric contrast and mild motion artifact. Stomach physiologically distended with ingested contents. There are no dilated or thickened small bowel loops. Small volume of stool throughout the colon without colonic wall thickening. The appendix is tentatively identified, no periappendiceal or pericecal inflammatory change.  Vascular/Lymphatic: No retroperitoneal adenopathy. Abdominal aorta is normal in caliber. Mild to moderate atherosclerosis without aneurysm.  Reproductive: Uterus remains in situ. No adnexal mass.  Bladder: Physiologically distended, no wall thickening. No bladder stone.  Other: No free air, free fluid, or intra-abdominal fluid collection.  Musculoskeletal: There are no acute or suspicious osseous abnormalities.  IMPRESSION: Obstructing 3 x 6 mm stone in the left proximal ureter with proximal hydroureteronephrosis.   Electronically Signed  By: Jeb Levering M.D.  On: 11/16/2015 18:18   Assessment & Plan:    Patient will undergo a left ureteroscopy with laser lithotripsy with left ureteral stent exchange for definitive treatment of her left proximal ureteral stone on 12/15/2015.    1. Left ureteral stone:   Patient will undergo a left URS/LL/left superior ureteral stent exchange for definitive treatment for her left proximal ureteral stone.  I explained to the patient how the procedure is performed and the risks involved.    I informed patient that she  will have a stent placed during the procedure and will remain in place after the procedure for a short time.  It will be removed in the office with a cystoscope, unless a string in left in place.  I informed that patient that about 50% of patients who undergo ureteroscopy and have a stent will have "stent pain," and this is by far the most common risk/complaint following ureteroscopy. A stent is a soft plastic tube (about half the size of IV tubing) that allows the kidney to drain to the bladder regardless of edema or obstruction. Not only can the stent "rub" on the inside of the bladder, causing a feeling of needing to urinate/overactive bladder, but also the stent allows urine to pass up from the bladder to the kidney during urination - causing symptoms from a warm, tingling sensation to intense pain in the affected flank.   They may be residual stones within the kidney or ureter may be present up to 40% of the time following ureteroscopy, depending on the original stone size and location. These stone fragments will be seen and addressed on follow-up imaging.  Injury to the ureter is the most common intra-operative complication during ureteroscopy. The reported risk of perforation ranges greatly, depending on whether it is defined as a complete perforation (0.1-0.7% - think of this as a hole through the entire ureter), a partial perforation (1.6% - a hole nearly through the entire ureter), or  mucosal tear/scrape (5% - these are similar to a sore on the inside of the mouth). Almost 100% of these will heal with prolonged stenting (anywhere between 2 - 4 weeks). Should a large perforation occur, your urologist may chose to stop the procedure and return on another day when the ureter has had time to heal.  I also explained the risks of general anesthesia, such as: MI, CVA, paralysis, coma and/or death.   - Urinalysis, Complete - CULTURE, URINE COMPREHENSIVE  2. Hydronephrosis:   Patient was found to have left hydronephrosis due to a proximal ureteral stone.  A RUS will be obtained one month after she has underwent URS/LL/left stent exchange and the stent is removed to ensure the hydronephrosis has resolved.    3. Microscopic hematuria:   Patient was found to have microscopic hematuria in association with her ureteral stone and pyelonephritis.  We will continue to monitor to ensure the hematuria clears when she receives definitive treatment for her ureteral stone.   Return for left URS/LL/left stent exchange for left ureteral stone .  These notes generated with voice recognition software. I apologize for typographical errors.  Zara Council, [facility] [address] [place], [place] [address] [phone]

## 2015-12-02 ENCOUNTER — Encounter
Admission: RE | Admit: 2015-12-02 | Discharge: 2015-12-02 | Disposition: A | Discharge status: Home / Self Care | Payer: Medicare Other | Source: Ambulatory Visit | Attending: Urology | Admitting: Urology

## 2015-12-02 DIAGNOSIS — Z01812 Encounter for preprocedural laboratory examination: Secondary | ICD-10-CM | POA: Insufficient documentation

## 2015-12-02 HISTORY — DX: Chronic kidney disease, unspecified: N18.9

## 2015-12-02 HISTORY — DX: Unspecified osteoarthritis, unspecified site: M19.90

## 2015-12-02 LAB — BASIC METABOLIC PANEL WITH GFR
Anion gap: 10 (ref 5–15)
BUN: 14 mg/dL (ref 6–20)
CO2: 27 mmol/L (ref 22–32)
Calcium: 10.3 mg/dL (ref 8.9–10.3)
Chloride: 98 mmol/L — ABNORMAL LOW (ref 101–111)
Creatinine, Ser: 0.98 mg/dL (ref 0.44–1.00)
GFR calc Af Amer: >60 mL/min
GFR calc non Af Amer: 59 mL/min — ABNORMAL LOW
Glucose, Bld: 131 mg/dL — ABNORMAL HIGH (ref 65–99)
Potassium: 4.3 mmol/L (ref 3.5–5.1)
Sodium: 135 mmol/L (ref 135–145)

## 2015-12-02 LAB — SEDIMENTATION RATE: Sed Rate: 18 mm/hr (ref 0–30)

## 2015-12-02 NOTE — Patient Instructions (Signed)
  Your procedure is scheduled on: December 15, 2015 (Monday) Report to Day Surgery.[facility]) second floor To find out your arrival time please call [phone] between 1PM - 3PM on December 12, 2015 (Friday).  Remember: Instructions that are not followed completely may result in serious medical risk, up to and including death, or upon the discretion of your surgeon and anesthesiologist your surgery may need to be rescheduled.    __x__ 1. Do not eat food or drink liquids after midnight. No gum chewing or hard candies.     __x__ 2. No Alcohol for 24 hours before or after surgery.   ____ 3. Bring all medications with you on the day of surgery if instructed.    __x__ 4. Notify your doctor if there is any change in your medical condition     (cold, fever, infections).     Do not wear jewelry, make-up, hairpins, clips or nail polish.  Do not wear lotions, powders, or perfumes. You may wear deodorant.  Do not shave 48 hours prior to surgery. Men may shave face and neck.  Do not bring valuables to the hospital.    [facility] is not responsible for any belongings or valuables.               Contacts, dentures or bridgework may not be worn into surgery.  Leave your suitcase in the car. After surgery it may be brought to your room.  For patients admitted to the hospital, discharge time is determined by your                treatment team.   Patients discharged the day of surgery will not be allowed to drive home.   Please read over the following fact sheets that you were given:   Surgical Site Infection Prevention   ____ Take these medicines the morning of surgery with A SIP OF WATER:    1. Omeprazole (Omeprazole at bedtime on April 2)  2. Effexor-XR  3. Clonazepam  4.  5.  6.  ____ Fleet Enema (as directed)   ____ Use CHG Soap as directed  __x__ Use inhalers on the day of surgery(Use  spiriva  inhaler the morning of surgery ) __x__ Stop metformin 2 days prior to surgery (Stop  Metformin on April 1)    ____ Take 1/2 of usual insulin dose the night before surgery and none on the morning of surgery.   __x__ Stop Coumadin/Plavix/aspirin on (Stop Aspirin one week prior to surgery)  __x__ Stop Anti-inflammatories on (NO NSAIDS) Tylenol ok to take for pain if needed   _x___ Stop supplements until after surgery.  (Stop Omega-3 now)  ____ Bring C-Pap to the hospital.

## 2015-12-03 LAB — CULTURE, URINE COMPREHENSIVE

## 2015-12-11 NOTE — Telephone Encounter (Signed)
LMOM reminding pt to hold ASA 81mg  7 days prior to surgery per Dr Twanna Hy and to call Friday prior to surgery for arrival time to SDS.

## 2015-12-11 NOTE — Telephone Encounter (Signed)
Notified pt of below. Pt voices understanding. 

## 2015-12-15 ENCOUNTER — Encounter: Payer: Self-pay | Admitting: *Deleted

## 2015-12-15 ENCOUNTER — Ambulatory Visit: Payer: Medicare Other | Admitting: Anesthesiology

## 2015-12-15 ENCOUNTER — Encounter
Admission: RE | Disposition: A | Discharge status: Home / Self Care | Payer: Self-pay | Source: Ambulatory Visit | Attending: Urology

## 2015-12-15 ENCOUNTER — Ambulatory Visit
Admission: RE | Admit: 2015-12-15 | Discharge: 2015-12-15 | Disposition: A | Discharge status: Home / Self Care | Payer: Medicare Other | Source: Ambulatory Visit | Attending: Urology | Admitting: Urology

## 2015-12-15 DIAGNOSIS — Z7982 Long term (current) use of aspirin: Secondary | ICD-10-CM | POA: Insufficient documentation

## 2015-12-15 DIAGNOSIS — Z800 Family history of malignant neoplasm of digestive organs: Secondary | ICD-10-CM | POA: Diagnosis not present

## 2015-12-15 DIAGNOSIS — Z7984 Long term (current) use of oral hypoglycemic drugs: Secondary | ICD-10-CM | POA: Insufficient documentation

## 2015-12-15 DIAGNOSIS — E119 Type 2 diabetes mellitus without complications: Secondary | ICD-10-CM | POA: Insufficient documentation

## 2015-12-15 DIAGNOSIS — F419 Anxiety disorder, unspecified: Secondary | ICD-10-CM | POA: Insufficient documentation

## 2015-12-15 DIAGNOSIS — N201 Calculus of ureter: Secondary | ICD-10-CM

## 2015-12-15 DIAGNOSIS — F329 Major depressive disorder, single episode, unspecified: Secondary | ICD-10-CM | POA: Insufficient documentation

## 2015-12-15 DIAGNOSIS — N12 Tubulo-interstitial nephritis, not specified as acute or chronic: Secondary | ICD-10-CM | POA: Diagnosis not present

## 2015-12-15 DIAGNOSIS — R3129 Other microscopic hematuria: Secondary | ICD-10-CM | POA: Diagnosis not present

## 2015-12-15 DIAGNOSIS — E785 Hyperlipidemia, unspecified: Secondary | ICD-10-CM | POA: Insufficient documentation

## 2015-12-15 DIAGNOSIS — N132 Hydronephrosis with renal and ureteral calculous obstruction: Secondary | ICD-10-CM | POA: Diagnosis not present

## 2015-12-15 DIAGNOSIS — Z87891 Personal history of nicotine dependence: Secondary | ICD-10-CM | POA: Diagnosis not present

## 2015-12-15 DIAGNOSIS — J449 Chronic obstructive pulmonary disease, unspecified: Secondary | ICD-10-CM | POA: Insufficient documentation

## 2015-12-15 DIAGNOSIS — Z8711 Personal history of peptic ulcer disease: Secondary | ICD-10-CM | POA: Insufficient documentation

## 2015-12-15 DIAGNOSIS — K219 Gastro-esophageal reflux disease without esophagitis: Secondary | ICD-10-CM | POA: Diagnosis not present

## 2015-12-15 HISTORY — PX: CYSTOSCOPY W/ URETERAL STENT PLACEMENT: SHX1429

## 2015-12-15 HISTORY — PX: URETEROSCOPY WITH HOLMIUM LASER LITHOTRIPSY: SHX6645

## 2015-12-15 LAB — GLUCOSE, CAPILLARY
GLUCOSE-CAPILLARY: 171 mg/dL — AB (ref 65–99)
Glucose-Capillary: 184 mg/dL — ABNORMAL HIGH (ref 65–99)

## 2015-12-15 SURGERY — URETEROSCOPY, WITH LITHOTRIPSY USING HOLMIUM LASER
Anesthesia: General | Laterality: Left

## 2015-12-15 MED ORDER — IOTHALAMATE MEGLUMINE 43 % IV SOLN
INTRAVENOUS | Status: DC | PRN
  Administered 2015-12-15: 15 mL

## 2015-12-15 MED ORDER — CEFAZOLIN SODIUM-DEXTROSE 2-4 GM/100ML-% IV SOLN
INTRAVENOUS | Status: AC
  Filled 2015-12-15: qty 100

## 2015-12-15 MED ORDER — OXYBUTYNIN CHLORIDE 5 MG PO TABS: 5.0000 mg | ORAL_TABLET | Freq: Three times a day (TID) | ORAL | Status: DC | PRN

## 2015-12-15 MED ORDER — NEOSTIGMINE METHYLSULFATE 10 MG/10ML IV SOLN
INTRAVENOUS | Status: DC | PRN
  Administered 2015-12-15: 5 mg via INTRAVENOUS

## 2015-12-15 MED ORDER — GLYCOPYRROLATE 0.2 MG/ML IJ SOLN
INTRAMUSCULAR | Status: DC | PRN
  Administered 2015-12-15: .8 mg via INTRAVENOUS

## 2015-12-15 MED ORDER — CEFAZOLIN SODIUM-DEXTROSE 2-4 GM/100ML-% IV SOLN
2.0000 g | Freq: Once | INTRAVENOUS | Status: AC
  Administered 2015-12-15: 2 g via INTRAVENOUS

## 2015-12-15 MED ORDER — ROCURONIUM BROMIDE 100 MG/10ML IV SOLN
INTRAVENOUS | Status: DC | PRN
  Administered 2015-12-15: 30 mg via INTRAVENOUS

## 2015-12-15 MED ORDER — PROPOFOL 10 MG/ML IV BOLUS
INTRAVENOUS | Status: DC | PRN
  Administered 2015-12-15: 150 mg via INTRAVENOUS

## 2015-12-15 MED ORDER — MIDAZOLAM HCL 2 MG/2ML IJ SOLN
INTRAMUSCULAR | Status: DC | PRN
  Administered 2015-12-15: 2 mg via INTRAVENOUS

## 2015-12-15 MED ORDER — ONDANSETRON HCL 4 MG/2ML IJ SOLN
INTRAMUSCULAR | Status: DC | PRN
Start: 2015-12-15 — End: 2015-12-15
  Administered 2015-12-15: 4 mg via INTRAVENOUS

## 2015-12-15 MED ORDER — TAMSULOSIN HCL 0.4 MG PO CAPS
0.4000 mg | ORAL_CAPSULE | Freq: Every day | ORAL | Status: DC
Start: 2015-12-15 — End: 2017-11-20

## 2015-12-15 MED ORDER — HYDROCODONE-ACETAMINOPHEN 5-325 MG PO TABS: 1.0000 | ORAL_TABLET | Freq: Four times a day (QID) | ORAL | Status: DC | PRN

## 2015-12-15 MED ORDER — SODIUM CHLORIDE 0.9 % IV SOLN
INTRAVENOUS | Status: DC
  Administered 2015-12-15: 10:00:00 via INTRAVENOUS

## 2015-12-15 MED ORDER — LIDOCAINE HCL (CARDIAC) 20 MG/ML IV SOLN
INTRAVENOUS | Status: DC | PRN
  Administered 2015-12-15: 80 mg via INTRAVENOUS

## 2015-12-15 MED ORDER — FENTANYL CITRATE (PF) 100 MCG/2ML IJ SOLN
INTRAMUSCULAR | Status: DC | PRN
  Administered 2015-12-15: 50 ug via INTRAVENOUS

## 2015-12-15 SURGICAL SUPPLY — 32 items
ADAPTER SCOPE UROLOK II (MISCELLANEOUS) IMPLANT
BAG DRAIN CYSTO-URO LG1000N (MISCELLANEOUS) ×3 IMPLANT
BASKET ZERO TIP 1.9FR (BASKET) ×3 IMPLANT
CATH FOL 2WAY LX 16X5 (CATHETERS) ×3 IMPLANT
CATH URETL 5X70 OPEN END (CATHETERS) ×3 IMPLANT
CNTNR SPEC 2.5X3XGRAD LEK (MISCELLANEOUS) ×1
CONRAY 43 FOR UROLOGY 50M (MISCELLANEOUS) ×3 IMPLANT
CONT SPEC 4OZ STER OR WHT (MISCELLANEOUS) ×2
CONTAINER SPEC 2.5X3XGRAD LEK (MISCELLANEOUS) ×1 IMPLANT
DRAPE UTILITY 15X26 TOWEL STRL (DRAPES) ×3 IMPLANT
GLOVE BIO SURGEON STRL SZ 6.5 (GLOVE) ×2 IMPLANT
GLOVE BIO SURGEONS STRL SZ 6.5 (GLOVE) ×1
GOWN STRL REUS W/ TWL LRG LVL4 (GOWN DISPOSABLE) ×2 IMPLANT
GOWN STRL REUS W/TWL LRG LVL4 (GOWN DISPOSABLE) ×4
HOLDER FOLEY CATH W/STRAP (MISCELLANEOUS) IMPLANT
INTRODUCER DILATOR DOUBLE (INTRODUCER) IMPLANT
KIT RM TURNOVER CYSTO AR (KITS) ×3 IMPLANT
LASER FIBER 200M SMARTSCOPE (Laser) ×3 IMPLANT
PACK CYSTO AR (MISCELLANEOUS) ×3 IMPLANT
PREP PVP WINGED SPONGE (MISCELLANEOUS) ×3 IMPLANT
PUMP SINGLE ACTION SAP (PUMP) IMPLANT
SENSORWIRE 0.038 NOT ANGLED (WIRE) ×6
SET CYSTO W/LG BORE CLAMP LF (SET/KITS/TRAYS/PACK) ×3 IMPLANT
SHEATH URETERAL 12FRX35CM (MISCELLANEOUS) IMPLANT
SOL .9 NS 3000ML IRR  AL (IV SOLUTION) ×2
SOL .9 NS 3000ML IRR UROMATIC (IV SOLUTION) ×1 IMPLANT
STENT URET 6FRX24 CONTOUR (STENTS) ×3 IMPLANT
STENT URET 6FRX26 CONTOUR (STENTS) IMPLANT
SURGILUBE 2OZ TUBE FLIPTOP (MISCELLANEOUS) ×3 IMPLANT
SYRINGE IRR TOOMEY STRL 70CC (SYRINGE) ×3 IMPLANT
WATER STERILE IRR 1000ML POUR (IV SOLUTION) ×3 IMPLANT
WIRE SENSOR 0.038 NOT ANGLED (WIRE) ×2 IMPLANT

## 2015-12-15 NOTE — H&P (View-Only) (Signed)
12/01/2015 12:07 PM   Samantha Welch 1950/09/27 [id number]  Referring provider: [place] [address] [place], [place] [address]  Chief Complaint  Patient presents with  . Nephrolithiasis    New Patient    HPI: Patient is a 65 year old Caucasian female who presents today for further discussion regarding her left obstructing ureteral stone.  Background history Patient presented to [facility] emergency room on 11/16/2015 complaining of left flank and abdominal pain.  She was experiencing nausea and vomiting and general malaise with this pain.  Her friend reportedly concerned for chills.  Her WBCs per prior field for 20.7 upon admission and a CT stone study noted a left proximal obstructing ureteral stone.  It was decided at that time for the patient to undergo emergent left ureteral stent placement for decompression.  She was then admitted for IV antibiotics, cultures and monitoring.  She was discharged on the fourth day of admission.  She does not have a prior history of stones.  Her daughter has had ESWL in the past.   Today, she is having stent discomfort.  She is having frequency, urgency and incontinence.  She is still experiencing nausea and left-sided flank pain for which she needs to take narcotic pain medication to control.  She denies any fevers, chills or recent vomiting. She is having dark-colored urine, but that is expected with a ureteral stent.  Her UA today greater than 30 RBC's/hpf, 11-30 WBC's/hpf and many bacteria.  She is just finishing her antibiotic.  Urine is sent for culture.    I have reviewed her CT scan images with the patient.  She has not passed a distinct stone at this time.    PMH: Past Medical History  Diagnosis Date  . Diabetes mellitus without complication ([place])   . GERD (gastroesophageal reflux disease)   . Anxiety   . Hyperlipemia   . COPD (chronic obstructive pulmonary  disease) ([place])   . Tobacco abuse disorder   . Depression   . Stomach ulcer     Surgical History: Past Surgical History  Procedure Laterality Date  . Tubal ligation    . Cystoscopy with stent placement Left 11/16/2015    Procedure: CYSTOSCOPY WITH LEFT STENT PLACEMENT, LEFT RETROGRADE;  Surgeon: Dereck Leep, MD;  Location: [facility] ORS;  Service: Urology;  Laterality: Left;    Home Medications:    Medication List       This list is accurate as of: 12/01/15 12:07 PM.  Always use your most recent med list.               aspirin EC 81 MG tablet  Take 81 mg by mouth at bedtime.     cephALEXin 500 MG capsule  Commonly known as:  KEFLEX  Take 1 capsule (500 mg total) by mouth every 12 (twelve) hours.     clonazePAM 1 MG tablet  Commonly known as:  KLONOPIN  Take 1 mg by mouth 2 (two) times daily.     docusate sodium 100 MG capsule  Commonly known as:  COLACE  Take 1 capsule (100 mg total) by mouth 2 (two) times daily.     hydrOXYzine 25 MG tablet  Commonly known as:  ATARAX/VISTARIL  Take 25 mg by mouth at bedtime.     metFORMIN 1000 MG tablet  Commonly known as:  GLUCOPHAGE  Take 1,000 mg by mouth every morning.     omega-3 acid  ethyl esters 1 g capsule  Commonly known as:  LOVAZA  Take 1-2 g by mouth daily as needed (for cholesterol.).     omeprazole 20 MG tablet  Commonly known as:  PRILOSEC OTC  Take 20 mg by mouth daily.     ondansetron 4 MG disintegrating tablet  Commonly known as:  ZOFRAN ODT  Take 1 tablet (4 mg total) by mouth every 8 (eight) hours as needed for nausea or vomiting.     oxyCODONE 5 MG immediate release tablet  Commonly known as:  Oxy IR/ROXICODONE  Take 1-2 tablets (5-10 mg total) by mouth every 4 (four) hours as needed (1 tablet for pain 4-7/10, 2 tablets for pain 8-10/10).     pravastatin 20 MG tablet  Commonly known as:  PRAVACHOL  Take 20 mg by mouth at bedtime.     tiotropium 18 MCG inhalation capsule  Commonly known as:   SPIRIVA  Place 18 mcg into inhaler and inhale daily.     venlafaxine XR 75 MG 24 hr capsule  Commonly known as:  EFFEXOR-XR  Take 75 mg by mouth daily with breakfast.        Allergies: No Known Allergies  Family History: Family History  Problem Relation Age of Onset  . Bladder Cancer Neg Hx   . Colon cancer Brother   . Kidney cancer Neg Hx   . Prostate cancer Neg Hx     Social History:  reports that she has quit smoking. Her smoking use included Cigarettes. She has never used smokeless tobacco. She reports that she does not drink alcohol or use illicit drugs.  ROS: UROLOGY Frequent Urination?: Yes Hard to postpone urination?: Yes Burning/pain with urination?: No Get up at night to urinate?: Yes Leakage of urine?: Yes Urine stream starts and stops?: No Trouble starting stream?: No Do you have to strain to urinate?: No Blood in urine?: Yes Urinary tract infection?: Yes Sexually transmitted disease?: No Injury to kidneys or bladder?: No Painful intercourse?: No Weak stream?: No Currently pregnant?: No Vaginal bleeding?: No Last menstrual period?: n  Gastrointestinal Nausea?: No Vomiting?: No Indigestion/heartburn?: No Diarrhea?: No Constipation?: No  Constitutional Fever: No Night sweats?: Yes Weight loss?: No Fatigue?: No  Skin Skin rash/lesions?: No Itching?: No  Eyes Blurred vision?: No Double vision?: No  Ears/Nose/Throat Sore throat?: No Sinus problems?: No  Hematologic/Lymphatic Swollen glands?: No Easy bruising?: No  Cardiovascular Leg swelling?: No Chest pain?: No  Respiratory Cough?: No Shortness of breath?: No  Endocrine Excessive thirst?: No  Musculoskeletal Back pain?: No Joint pain?: No  Neurological Headaches?: No Dizziness?: No  Psychologic Depression?: Yes Anxiety?: Yes  Physical Exam: BP 120/72 mmHg  Pulse 71  Ht 5\' 2"  (1.575 m)  Wt 166 lb (75.297 kg)  BMI 30.35 kg/m2  Constitutional: Well nourished.  Alert and oriented, No acute distress. HEENT: [place] AT, moist mucus membranes. Trachea midline, no masses. Cardiovascular: No clubbing, cyanosis, or edema. Respiratory: Normal respiratory effort, no increased work of breathing. GI: Abdomen is soft, non tender, non distended, no abdominal masses. Liver and spleen not palpable.  No hernias appreciated.  Stool sample for occult testing is not indicated.   GU: No CVA tenderness.  No bladder fullness or masses.   Skin: No rashes, bruises or suspicious lesions. Lymph: No cervical or inguinal adenopathy. Neurologic: Grossly intact, no focal deficits, moving all 4 extremities. Psychiatric: Normal mood and affect.  Laboratory Data: Lab Results  Component Value Date   WBC 8.4 11/20/2015  HGB 12.2 11/20/2015   HCT 35.1 11/20/2015   MCV 88.9 11/20/2015   PLT 177 11/20/2015    Lab Results  Component Value Date   CREATININE 0.90 11/20/2015    Lab Results  Component Value Date   AST 23 11/16/2015   Lab Results  Component Value Date   ALT 21 11/16/2015    Urinalysis Results for orders placed or performed in visit on 12/01/15  Microscopic Examination  Result Value Ref Range   WBC, UA 11-30 (A) 0 -  5 /hpf   RBC, UA >30 (H) 0 -  2 /hpf   Epithelial Cells (non renal) 0-10 0 - 10 /hpf   Bacteria, UA Many (A) None seen/Few  Urinalysis, Complete  Result Value Ref Range   Specific Gravity, UA 1.020 1.005 - 1.030   pH, UA 5.5 5.0 - 7.5   Color, UA Yellow Yellow   Appearance Ur Cloudy (A) Clear   Leukocytes, UA 2+ (A) Negative   Protein, UA 1+ (A) Negative/Trace   Glucose, UA Negative Negative   Ketones, UA Negative Negative   RBC, UA 3+ (A) Negative   Bilirubin, UA Negative Negative   Urobilinogen, Ur 0.2 0.2 - 1.0 mg/dL   Nitrite, UA Negative Negative   Microscopic Examination See below:     Pertinent Imaging: CLINICAL DATA: Awoke with left flank pain.  EXAM: CT ABDOMEN AND PELVIS WITHOUT  CONTRAST  TECHNIQUE: Multidetector CT imaging of the abdomen and pelvis was performed following the standard protocol without IV contrast.  COMPARISON: None.  FINDINGS: Lower chest: Minimal dependent atelectasis at the lung bases. Heart is normal in size. No pleural effusion.  Liver: Mild diffusely decreased density consistent with steatosis. No evidence of focal lesion allowing for lack contrast.  Hepatobiliary: Gallbladder physiologically distended, no calcified stone. No biliary dilatation.  Pancreas: No ductal dilatation or inflammation. Mild motion artifact at this level.  Spleen: Normal, allowing for mild motion artifact.  Adrenal glands: No nodule.  Kidneys: Obstructing 3 x 6 mm stone in the left proximal ureter (at the level of L2) with proximal hydroureteronephrosis and perinephric stranding. Ureter distal to this is decompressed. No additional nonobstructing stones in either kidney. No right hydronephrosis.  Stomach/Bowel: Limited assessment secondary to lack of enteric contrast and mild motion artifact. Stomach physiologically distended with ingested contents. There are no dilated or thickened small bowel loops. Small volume of stool throughout the colon without colonic wall thickening. The appendix is tentatively identified, no periappendiceal or pericecal inflammatory change.  Vascular/Lymphatic: No retroperitoneal adenopathy. Abdominal aorta is normal in caliber. Mild to moderate atherosclerosis without aneurysm.  Reproductive: Uterus remains in situ. No adnexal mass.  Bladder: Physiologically distended, no wall thickening. No bladder stone.  Other: No free air, free fluid, or intra-abdominal fluid collection.  Musculoskeletal: There are no acute or suspicious osseous abnormalities.  IMPRESSION: Obstructing 3 x 6 mm stone in the left proximal ureter with proximal hydroureteronephrosis.   Electronically Signed  By: Jeb Levering M.D.  On: 11/16/2015 18:18   Assessment & Plan:    Patient will undergo a left ureteroscopy with laser lithotripsy with left ureteral stent exchange for definitive treatment of her left proximal ureteral stone on 12/15/2015.    1. Left ureteral stone:   Patient will undergo a left URS/LL/left superior ureteral stent exchange for definitive treatment for her left proximal ureteral stone.  I explained to the patient how the procedure is performed and the risks involved.    I informed patient that she  will have a stent placed during the procedure and will remain in place after the procedure for a short time.  It will be removed in the office with a cystoscope, unless a string in left in place.  I informed that patient that about 50% of patients who undergo ureteroscopy and have a stent will have "stent pain," and this is by far the most common risk/complaint following ureteroscopy. A stent is a soft plastic tube (about half the size of IV tubing) that allows the kidney to drain to the bladder regardless of edema or obstruction. Not only can the stent "rub" on the inside of the bladder, causing a feeling of needing to urinate/overactive bladder, but also the stent allows urine to pass up from the bladder to the kidney during urination - causing symptoms from a warm, tingling sensation to intense pain in the affected flank.   They may be residual stones within the kidney or ureter may be present up to 40% of the time following ureteroscopy, depending on the original stone size and location. These stone fragments will be seen and addressed on follow-up imaging.  Injury to the ureter is the most common intra-operative complication during ureteroscopy. The reported risk of perforation ranges greatly, depending on whether it is defined as a complete perforation (0.1-0.7% - think of this as a hole through the entire ureter), a partial perforation (1.6% - a hole nearly through the entire ureter), or  mucosal tear/scrape (5% - these are similar to a sore on the inside of the mouth). Almost 100% of these will heal with prolonged stenting (anywhere between 2 - 4 weeks). Should a large perforation occur, your urologist may chose to stop the procedure and return on another day when the ureter has had time to heal.  I also explained the risks of general anesthesia, such as: MI, CVA, paralysis, coma and/or death.   - Urinalysis, Complete - CULTURE, URINE COMPREHENSIVE  2. Hydronephrosis:   Patient was found to have left hydronephrosis due to a proximal ureteral stone.  A RUS will be obtained one month after she has underwent URS/LL/left stent exchange and the stent is removed to ensure the hydronephrosis has resolved.    3. Microscopic hematuria:   Patient was found to have microscopic hematuria in association with her ureteral stone and pyelonephritis.  We will continue to monitor to ensure the hematuria clears when she receives definitive treatment for her ureteral stone.   Return for left URS/LL/left stent exchange for left ureteral stone .  These notes generated with voice recognition software. I apologize for typographical errors.  Zara Council, [facility] [address] [place], [place] [address] [phone]

## 2015-12-15 NOTE — Discharge Instructions (Signed)
You have a ureteral stent in place.  This is a tube that extends from your kidney to your bladder.  This may cause urinary bleeding, burning with urination, and urinary frequency.  Please call our office or present to the ED if you develop fevers >101 or pain which is not able to be controlled with oral pain medications.  You may be given either Flomax and/ or ditropan to help with bladder spasms and stent pain in addition to pain medications.    Your stent is attached to a string. You may untaped this in 3 days, pulled the string gently to remove the stent in entirety.  [facility] [address], [place], [place] [address] [phone]   AMBULATORY SURGERY  DISCHARGE INSTRUCTIONS   1) The drugs that you were given will stay in your system until tomorrow so for the next 24 hours you should not:  A) Drive an automobile B) Make any legal decisions C) Drink any alcoholic beverage   2) You may resume regular meals tomorrow.  Today it is better to start with liquids and gradually work up to solid foods.  You may eat anything you prefer, but it is better to start with liquids, then soup and crackers, and gradually work up to solid foods.   3) Please notify your doctor immediately if you have any unusual bleeding, trouble breathing, redness and pain at the surgery site, drainage, fever, or pain not relieved by medication.    4) Additional Instructions:        Please contact your physician with any problems or Same Day Surgery at [phone], Monday through Friday 6 am to 4 pm, or [place] at [facility] number at [phone].

## 2015-12-15 NOTE — Interval H&P Note (Signed)
History and Physical Interval Note:  12/15/2015 10:49 AM  Samantha Welch  has presented today for surgery, with the diagnosis of left obstructing ureteral stone   The various methods of treatment have been discussed with the patient and family. After consideration of risks, benefits and other options for treatment, the patient has consented to  Procedure(s): URETEROSCOPY WITH HOLMIUM LASER LITHOTRIPSY (Left) CYSTOSCOPY WITH STENT REPLACEMENT (Left) as a surgical intervention .  The patient's history has been reviewed, patient examined, no change in status, stable for surgery.  I have reviewed the patient's chart and labs.  Questions were answered to the patient's satisfaction.    [facility]AB  Hollice Espy

## 2015-12-15 NOTE — Anesthesia Procedure Notes (Signed)
Procedure Name: Intubation Performed by: Airis Barbee Pre-anesthesia Checklist: Patient identified, Patient being monitored, Timeout performed, Emergency Drugs available and Suction available Patient Re-evaluated:Patient Re-evaluated prior to inductionOxygen Delivery Method: Circle system utilized Preoxygenation: Pre-oxygenation with 100% oxygen Intubation Type: IV induction Ventilation: Mask ventilation without difficulty Laryngoscope Size: Mac and 3 Grade View: Grade I Tube type: Oral Tube size: 7.0 mm Number of attempts: 1 Airway Equipment and Method: Stylet Placement Confirmation: ETT inserted through vocal cords under direct vision,  positive ETCO2 and breath sounds checked- equal and bilateral Secured at: 21 cm Tube secured with: Tape Dental Injury: Teeth and Oropharynx as per pre-operative assessment        

## 2015-12-15 NOTE — Anesthesia Postprocedure Evaluation (Signed)
Anesthesia Post Note  Patient: Kris Mouton  Procedure(s) Performed: Procedure(s) (LRB): URETEROSCOPY WITH HOLMIUM LASER LITHOTRIPSY (Left) CYSTOSCOPY WITH STENT REPLACEMENT (Left)  Patient location during evaluation: PACU Anesthesia Type: General Level of consciousness: awake and alert Pain management: pain level controlled Vital Signs Assessment: post-procedure vital signs reviewed and stable Respiratory status: spontaneous breathing and respiratory function stable Cardiovascular status: stable Anesthetic complications: no    Last Vitals:  Filed Vitals:   12/15/15 1200 12/15/15 1201  BP: 157/62   Pulse: 69 68  Temp: 36.6 C   Resp: 17 21    Last Pain: There were no vitals filed for this visit.               KEPHART,WILLIAM K

## 2015-12-15 NOTE — Transfer of Care (Signed)
Immediate Anesthesia Transfer of Care Note  Patient: Samantha Welch  Procedure(s) Performed: Procedure(s): URETEROSCOPY WITH HOLMIUM LASER LITHOTRIPSY (Left) CYSTOSCOPY WITH STENT REPLACEMENT (Left)  Patient Location: PACU  Anesthesia Type:General  Level of Consciousness: sedated and responds to stimulation  Airway & Oxygen Therapy: Patient Spontanous Breathing and Patient connected to face mask oxygen  Post-op Assessment: Report given to RN and Post -op Vital signs reviewed and stable  Post vital signs: Reviewed and stable  Last Vitals:  Filed Vitals:   12/15/15 1200 12/15/15 1201  BP: 157/62   Pulse: 69 68  Temp: 36.6 C   Resp: 17 21    Complications: No apparent anesthesia complications

## 2015-12-15 NOTE — OR Nursing (Signed)
Stent in place string taped to left leg

## 2015-12-15 NOTE — Anesthesia Preprocedure Evaluation (Signed)
Anesthesia Evaluation  Patient identified by MRN, date of birth, ID band Patient awake    Reviewed: Allergy & Precautions, NPO status , Patient's Chart, lab work & pertinent test results  History of Anesthesia Complications Negative for: history of anesthetic complications  Airway Mallampati: III       Dental   Pulmonary COPD,  COPD inhaler, former smoker,           Cardiovascular negative cardio ROS       Neuro/Psych Anxiety Depression negative neurological ROS     GI/Hepatic PUD, GERD  Medicated,  Endo/Other  diabetes, Oral Hypoglycemic Agents  Renal/GU Renal disease (stones)     Musculoskeletal  (+) Arthritis , Osteoarthritis,    Abdominal   Peds  Hematology negative hematology ROS (+)   Anesthesia Other Findings   Reproductive/Obstetrics                             Anesthesia Physical Anesthesia Plan  ASA: III  Anesthesia Plan: General   Post-op Pain Management:    Induction: Intravenous  Airway Management Planned: Oral ETT  Additional Equipment:   Intra-op Plan:   Post-operative Plan:   Informed Consent: I have reviewed the patients History and Physical, chart, labs and discussed the procedure including the risks, benefits and alternatives for the proposed anesthesia with the patient or authorized representative who has indicated his/her understanding and acceptance.     Plan Discussed with:   Anesthesia Plan Comments:         Anesthesia Quick Evaluation

## 2015-12-15 NOTE — Op Note (Signed)
Date of procedure: 12/15/2015  Preoperative diagnosis:  1. Left ureteral stone   Postoperative diagnosis:  1. Same as above   Procedure: 1. Left ureteroscopy 2. Laser lithotripsy 3. Basket extraction of Stone fragment 4. Left ureteral stent exchange  Surgeon: Hollice Espy, MD  Anesthesia: General  Complications: None  Intraoperative findings: 6 mm proximal ureteral stone identified, obliterated, all fragments removed via basket  EBL: Minimal   Specimens:  stone fragment  Drains:  6 x 24 French double-J ureteral stent on left (string left in place)  Indication: Samantha Welch is a 65 y.o. patient with  6 mm proximal ureteral stone who returns today for definitive management status post stent placement. After reviewing the management options for treatment, she elected to proceed with the above surgical procedure(s). We have discussed the potential benefits and risks of the procedure, side effects of the proposed treatment, the likelihood of the patient achieving the goals of the procedure, and any potential problems that might occur during the procedure or recuperation. Informed consent has been obtained.  Description of procedure:  The patient was taken to the operating room and general anesthesia was induced.  The patient was placed in the dorsal lithotomy position, prepped and draped in the usual sterile fashion, and preoperative antibiotics were administered. A preoperative time-out was performed.   At this point time, a rigid 21 [place] scope was advanced per urethra into the bladder. Attention was turned to the left ureteral orifice from which a ureteral stent was seen emanating. The distal coil of the stent was grasped using stent graspers and brought to the level of the urethral meatus. The stent was then cannulated using a sensor wire up to level of the kidney without difficulty. The wire was left in place and the stent was removed. The wire was then snapped in place as a  safety wire. A 4.5 French semirigid ureteroscope was then advanced alongside the wire up to level of the proximal ureter where the stone was identified. Given the location of the stone and concern for retropulsion, a 1.9 French nitinol basket was used to bring the stent down into the distal ureter for further fragmentation. This was achieved without difficulty and the stone was released. A 200  laser fiber was then brought in and using the settings of 0.8 J and 10 Hz, the stone was fragmented into proximally 3 or 4 small pieces. Each of these pieces were then extracted via basket. The scope was then advanced all the way back up to level of the proximal ureter and there was no residual stone fragments identified. There is no ureteral injury or trauma noted either. The scope was then removed. A 6 x 24 French double-J ureteral stent was then advanced over the safety wire up to level of the kidney under fluoroscopic guidance. The wire was partially drawn until full coil was noted within the renal pelvis. The wire was then completely withdrawn and a full coil was noted to of the bladder. The string was left in place. The cystoscope sheath was then introduced back into the bladder to drain the bladder. The patient was then cleaned and dried. The stent string was affixed to the patient's left inner thigh using Mastisol and Tegaderm. She was then repositioned supine position, reversed from anesthesia, taken the PACU in stable condition  Plan: Patient will remove her own stent in 3 days. She'll follow-up in 4 weeks with a renal ultrasound prior.   Hollice Espy, M.D.

## 2015-12-16 ENCOUNTER — Telehealth: Payer: Self-pay

## 2015-12-16 NOTE — Telephone Encounter (Signed)
Pt call with questions about when to remove her stent. I reviewed Dr. Erlene Quan OR notes and informed her she said three days after procedure. The pt verbalized understanding.

## 2015-12-20 LAB — STONE ANALYSIS
CA OXALATE, MONOHYDR.: 77 %
Ca Oxalate,Dihydrate: 3 %
Ca phos cry stone ql IR: 10 %
Dried Blood: 10 %
STONE WEIGHT KSTONE: 19 mg

## 2015-12-23 ENCOUNTER — Telehealth: Payer: Self-pay

## 2015-12-23 MED ORDER — FLUCONAZOLE 100 MG PO TABS: 100.0000 mg | ORAL_TABLET | Freq: Every day | ORAL | Status: DC

## 2015-12-23 NOTE — Telephone Encounter (Signed)
Diflucan 100 mg PO qd x 7 days.  Sent to pharmacy.  Please make patient's aware.  Hollice Espy, MD

## 2015-12-23 NOTE — Telephone Encounter (Signed)
Patient called stating that her tongue is sore notes something white at the back of her throat sometimes, she thinks she might have thrush from the antibiotics she is taking. It was recommended for her to go to her PCP to get evaluated to see if it is indeed thrush. Patient wants to know if Dr. Erlene Quan could just call her is something, she doesn't want to go to [place] to see her PCP.

## 2015-12-24 NOTE — Telephone Encounter (Signed)
Left patient mess notifying her/SW

## 2016-01-09 ENCOUNTER — Ambulatory Visit
Admission: RE | Admit: 2016-01-09 | Discharge: 2016-01-09 | Disposition: A | Discharge status: Home / Self Care | Payer: Medicare Other | Source: Ambulatory Visit | Attending: Urology | Admitting: Urology

## 2016-01-09 DIAGNOSIS — Z87442 Personal history of urinary calculi: Secondary | ICD-10-CM | POA: Diagnosis not present

## 2016-01-09 DIAGNOSIS — K760 Fatty (change of) liver, not elsewhere classified: Secondary | ICD-10-CM | POA: Insufficient documentation

## 2016-01-09 DIAGNOSIS — N201 Calculus of ureter: Secondary | ICD-10-CM | POA: Diagnosis present

## 2016-01-12 ENCOUNTER — Ambulatory Visit (INDEPENDENT_AMBULATORY_CARE_PROVIDER_SITE_OTHER): Payer: Medicare Other | Admitting: Urology

## 2016-01-12 ENCOUNTER — Encounter: Payer: Self-pay | Admitting: Urology

## 2016-01-12 VITALS — BP 135/78 | HR 75 | Ht 62.0 in | Wt 168.6 lb

## 2016-01-12 DIAGNOSIS — N201 Calculus of ureter: Secondary | ICD-10-CM

## 2016-01-12 DIAGNOSIS — R3129 Other microscopic hematuria: Secondary | ICD-10-CM | POA: Diagnosis not present

## 2016-01-12 DIAGNOSIS — N132 Hydronephrosis with renal and ureteral calculous obstruction: Secondary | ICD-10-CM

## 2016-01-12 LAB — URINALYSIS, COMPLETE
Bilirubin, UA: NEGATIVE
KETONES UA: NEGATIVE
Nitrite, UA: POSITIVE — AB
PROTEIN UA: NEGATIVE
SPEC GRAV UA: 1.015 (ref 1.005–1.030)
Urobilinogen, Ur: 0.2 mg/dL (ref 0.2–1.0)
pH, UA: 6 (ref 5.0–7.5)

## 2016-01-12 LAB — MICROSCOPIC EXAMINATION
Epithelial Cells (non renal): >10 /hpf — AB (ref 0–10)
WBC, UA: >30 /hpf — AB (ref 0–?)

## 2016-01-12 MED ORDER — URIBEL 118 MG PO CAPS: 1.0000 | ORAL_CAPSULE | Freq: Four times a day (QID) | ORAL | Status: DC

## 2016-01-12 NOTE — Progress Notes (Signed)
11:16 AM   Samantha Welch [birth year] [id number]  Referring provider: [facility] [address] [place], [place] [address]  Chief Complaint  Patient presents with  . Routine Post Op    4 week follow up ureteroscopy with Holmium laser lithotripsy    HPI: Patient is a 65 year old Caucasian female who presents today to discuss the results of her renal ultrasound at her postoperative course after undergoing URS/LL/ureteral stent exchange and removal.  Patient underwent left ureteroscopic the with laser lithotripsy with basket extraction of stone fragments and left ureteral stent exchange on 12/15/2015.   She then removed her own stent 3 days later.  She underwent a renal ultrasound on 01/09/2016 which noted resolution of the left hydronephrosis.  Ultrasound images were reviewed with the patient.     Her postoperative course was fairly unremarkable.  Today, she feels that she may have a urinary tract infection.  She is having some dysuria and suprapubic pain. She denied any gross hematuria. She is not had any fevers, chills, nausea or vomiting.  Her UA today was nitrate positive with greater than 30 WBCs and 3-10 rbc's per high-power field.  Stone analysis notes a stone composition of 3% calcium oxalate dihydrate, 77% calcium oxalate monohydrate and 10% calcium phosphate stone.  PMH: Past Medical History  Diagnosis Date  . Diabetes mellitus without complication ([place])   . GERD (gastroesophageal reflux disease)   . Anxiety   . Hyperlipemia   . COPD (chronic obstructive pulmonary disease) ([place])   . Tobacco abuse disorder   . Depression   . Stomach ulcer   . Chronic kidney disease     Left kidney stone  . Arthritis     Surgical History: Past Surgical History  Procedure Laterality Date  . Tubal ligation    . Cystoscopy with stent placement Left 11/16/2015    Procedure: CYSTOSCOPY WITH LEFT STENT PLACEMENT, LEFT RETROGRADE;  Surgeon:  Dereck Leep, MD;  Location: [facility];  Service: Urology;  Laterality: Left;  . Foot surgery Bilateral   . Ureteroscopy with holmium laser lithotripsy Left 12/15/2015    Procedure: URETEROSCOPY WITH HOLMIUM LASER LITHOTRIPSY;  Surgeon: Hollice Espy, MD;  Location: [facility];  Service: Urology;  Laterality: Left;  . Cystoscopy w/ ureteral stent placement Left 12/15/2015    Procedure: CYSTOSCOPY WITH STENT REPLACEMENT;  Surgeon: Hollice Espy, MD;  Location: [facility];  Service: Urology;  Laterality: Left;    Home Medications:    Medication List       This list is accurate as of: 01/12/16 11:16 AM.  Always use your most recent med list.               aspirin EC 81 MG tablet  Take 81 mg by mouth at bedtime.     cephALEXin 500 MG capsule  Commonly known as:  KEFLEX  Take 1 capsule (500 mg total) by mouth every 12 (twelve) hours.     clonazePAM 1 MG tablet  Commonly known as:  KLONOPIN  Take 1 mg by mouth 2 (two) times daily.     docusate sodium 100 MG capsule  Commonly known as:  COLACE  Take 1 capsule (100 mg total) by mouth 2 (two) times daily.     fluconazole 100 MG tablet  Commonly known as:  DIFLUCAN  Take 1 tablet (100 mg total) by mouth daily. X 7 days     HYDROcodone-acetaminophen 5-325 MG tablet  Commonly known as:  NORCO/VICODIN  Take 1-2 tablets by mouth every 6 (six) hours as needed for moderate pain.     hydrOXYzine 25 MG tablet  Commonly known as:  ATARAX/VISTARIL  Take 25 mg by mouth at bedtime.     metFORMIN 1000 MG tablet  Commonly known as:  GLUCOPHAGE  Take 1,000 mg by mouth every morning.     omega-3 acid ethyl esters 1 g capsule  Commonly known as:  LOVAZA  Take 1-2 g by mouth daily as needed (for cholesterol.).     omeprazole 20 MG tablet  Commonly known as:  PRILOSEC OTC  Take 20 mg by mouth daily. Reported on 01/12/2016     ondansetron 4 MG disintegrating tablet  Commonly known as:  ZOFRAN ODT  Take 1 tablet (4 mg total) by mouth  every 8 (eight) hours as needed for nausea or vomiting.     oxybutynin 5 MG tablet  Commonly known as:  DITROPAN  Take 1 tablet (5 mg total) by mouth every 8 (eight) hours as needed for bladder spasms.     oxyCODONE 5 MG immediate release tablet  Commonly known as:  Oxy IR/ROXICODONE  Take 1-2 tablets (5-10 mg total) by mouth every 4 (four) hours as needed (1 tablet for pain 4-7/10, 2 tablets for pain 8-10/10).     pravastatin 20 MG tablet  Commonly known as:  PRAVACHOL  Take 20 mg by mouth at bedtime.     tamsulosin 0.4 MG Caps capsule  Commonly known as:  FLOMAX  Take 1 capsule (0.4 mg total) by mouth daily.     tiotropium 18 MCG inhalation capsule  Commonly known as:  SPIRIVA  Place 18 mcg into inhaler and inhale daily.     URIBEL 118 MG Caps  Take 1 capsule (118 mg total) by mouth 4 (four) times daily.     venlafaxine XR 75 MG 24 hr capsule  Commonly known as:  EFFEXOR-XR  Take 75 mg by mouth daily with breakfast.        Allergies: No Known Allergies  Family History: Family History  Problem Relation Age of Onset  . Bladder Cancer Neg Hx   . Kidney cancer Neg Hx   . Prostate cancer Neg Hx   . Colon cancer Brother     Social History:  reports that she has quit smoking. Her smoking use included Cigarettes. She smoked 0.50 packs per day. She has never used smokeless tobacco. She reports that she does not drink alcohol or use illicit drugs.  ROS: UROLOGY Frequent Urination?: No Hard to postpone urination?: No Burning/pain with urination?: No Get up at night to urinate?: No Leakage of urine?: No Urine stream starts and stops?: No Trouble starting stream?: No Do you have to strain to urinate?: No Blood in urine?: No Urinary tract infection?: No Sexually transmitted disease?: No Injury to kidneys or bladder?: No Painful intercourse?: No Weak stream?: No Currently pregnant?: No Vaginal bleeding?: No Last menstrual period?: n  Gastrointestinal Nausea?:  No Vomiting?: No Indigestion/heartburn?: Yes Diarrhea?: No Constipation?: No  Constitutional Fever: No Night sweats?: No Weight loss?: No Fatigue?: Yes  Skin Skin rash/lesions?: No Itching?: No  Eyes Blurred vision?: Yes Double vision?: No  Ears/Nose/Throat Sore throat?: No Sinus problems?: Yes  Hematologic/Lymphatic Swollen glands?: No Easy bruising?: No  Cardiovascular Leg swelling?: No Chest pain?: No  Respiratory Cough?: No Shortness of breath?: No  Endocrine Excessive thirst?: No  Musculoskeletal Back pain?: No Joint pain?: No  Neurological  Headaches?: No Dizziness?: No  Psychologic Depression?: No Anxiety?: Yes  Physical Exam: BP 135/78 mmHg  Pulse 75  Ht 5\' 2"  (1.575 m)  Wt 168 lb 9.6 oz (76.476 kg)  BMI 30.83 kg/m2  Constitutional: Well nourished. Alert and oriented, No acute distress. HEENT:  AT, moist mucus membranes. Trachea midline, no masses. Cardiovascular: No clubbing, cyanosis, or edema. Respiratory: Normal respiratory effort, no increased work of breathing. GI: Abdomen is soft, non tender, non distended, no abdominal masses.  GU: No CVA tenderness.  No bladder fullness or masses.   Skin: No rashes, bruises or suspicious lesions. Lymph: No cervical or inguinal adenopathy. Neurologic: Grossly intact, no focal deficits, moving all 4 extremities. Psychiatric: Normal mood and affect.  Laboratory Data: Lab Results  Component Value Date   WBC 8.4 11/20/2015   HGB 12.2 11/20/2015   HCT 35.1 11/20/2015   MCV 88.9 11/20/2015   PLT 177 11/20/2015    Lab Results  Component Value Date   CREATININE 0.98 12/02/2015    Lab Results  Component Value Date   AST 23 11/16/2015   Lab Results  Component Value Date   ALT 21 11/16/2015    Urinalysis Results for orders placed or performed in visit on 01/12/16  CULTURE, URINE COMPREHENSIVE  Result Value Ref Range   Urine Culture, Comprehensive Final report (A)    Result 1  Comment (A)    ANTIMICROBIAL SUSCEPTIBILITY Comment   Microscopic Examination  Result Value Ref Range   WBC, UA >30 (A) 0 -  5 /hpf   RBC, UA 3-10 (A) 0 -  2 /hpf   Epithelial Cells (non renal) >10 (A) 0 - 10 /hpf   Bacteria, UA Few None seen/Few  Urinalysis, Complete  Result Value Ref Range   Specific Gravity, UA 1.015 1.005 - 1.030   pH, UA 6.0 5.0 - 7.5   Color, UA Yellow Yellow   Appearance Ur Cloudy (A) Clear   Leukocytes, UA 3+ (A) Negative   Protein, UA Negative Negative/Trace   Glucose, UA Trace (A) Negative   Ketones, UA Negative Negative   RBC, UA Trace (A) Negative   Bilirubin, UA Negative Negative   Urobilinogen, Ur 0.2 0.2 - 1.0 mg/dL   Nitrite, UA Positive (A) Negative   Microscopic Examination See below:     Pertinent Imaging: CLINICAL DATA: Obstructing left ureteral stone status post stent placement, presenting for follow-up.  EXAM: RENAL / URINARY TRACT ULTRASOUND COMPLETE  COMPARISON: 11/16/2015 CT abdomen/pelvis.  FINDINGS: Right Kidney:  Length: 9.9 cm. Echogenicity within normal limits. No mass or hydronephrosis visualized. No stones large enough to cause acoustic shadowing. Incidentally noted is diffusely echogenic liver parenchyma in the visualized right liver lobe with posterior acoustic attenuation, consistent with diffuse hepatic steatosis.  Left Kidney:  Length: 10.3 cm. Echogenicity within normal limits. No mass or residual hydronephrosis visualized. No stones large enough to cause acoustic shadowing.  Bladder:  Appears normal for degree of bladder distention.  IMPRESSION: 1. Normal kidneys. No residual left hydronephrosis. 2. Normal bladder. 3. Incidental diffuse hepatic steatosis.   Electronically Signed  By: Ilona Sorrel M.D.  On: 01/09/2016 11:51   Assessment & Plan:    Patient underwent a left ureteroscopy with laser lithotripsy with left ureteral stent exchange for definitive treatment of her left  proximal ureteral stone on 12/15/2015.   She removed the stent herself 3 days later.    1. Left ureteral stone:   Resolved.  2. Hydronephrosis:   RUS demonstrates resolution of  the hydronephrosis.    3. Microscopic hematuria:   Patient was found to have microscopic hematuria on today's UA with associated positive nitrates and urinary tract symptoms. I will send it for culture and sensitivities.  I will wait to prescribe an antibiotic once sensitivities are available.  - Urinalysis, Complete - CULTURE, URINE COMPREHENSIVE  Return for follow up pending urine culture.  These notes generated with voice recognition software. I apologize for typographical errors.  Zara Council, [facility] [address], [place], [place] [address] [phone]

## 2016-01-13 ENCOUNTER — Telehealth: Payer: Self-pay

## 2016-01-13 DIAGNOSIS — N390 Urinary tract infection, site not specified: Secondary | ICD-10-CM

## 2016-01-13 NOTE — Telephone Encounter (Signed)
Tell the pharmacist to run it as cash.

## 2016-01-13 NOTE — Telephone Encounter (Signed)
Pt called stating she is not able to get the uribel due to insurance coverage and not able to use the coupon due to insurance. Pt would like another medication. Please advise.

## 2016-01-14 ENCOUNTER — Other Ambulatory Visit: Payer: Self-pay

## 2016-01-14 MED ORDER — URIBEL 118 MG PO CAPS
1.0000 | ORAL_CAPSULE | Freq: Four times a day (QID) | ORAL | Status: DC
Start: 2016-01-14 — End: 2016-01-21

## 2016-01-15 ENCOUNTER — Telehealth: Payer: Self-pay

## 2016-01-15 LAB — CULTURE, URINE COMPREHENSIVE

## 2016-01-15 MED ORDER — AMOXICILLIN-POT CLAVULANATE 875-125 MG PO TABS: 1.0000 | ORAL_TABLET | Freq: Two times a day (BID) | ORAL | Status: AC

## 2016-01-15 NOTE — Telephone Encounter (Signed)
-----   Message from Nori Riis, PA-C sent at 01/15/2016 12:24 PM EDT ----- Patient has a +UCx.  They need to start Augmentin 875/125, one tablet twice daily for seven days and then we need to check a specimen in 3 to 5 days after they complete their antibiotics.

## 2016-01-15 NOTE — Telephone Encounter (Signed)
Spoke with pt in reference to abx and uribel. Made aware abx is for a +ucx. Pt voiced understanding of both medications.

## 2016-01-15 NOTE — Telephone Encounter (Signed)
Spoke with Writer at [facility] in reference to [place]. Gave Rich a verbal order as he did not have a escript for pt. Made Rich aware pt has a coupon for Uribel.

## 2016-01-20 ENCOUNTER — Encounter: Payer: Self-pay | Admitting: Urology

## 2016-01-20 ENCOUNTER — Ambulatory Visit (INDEPENDENT_AMBULATORY_CARE_PROVIDER_SITE_OTHER): Payer: Medicare Other | Admitting: Urology

## 2016-01-20 VITALS — BP 136/75 | HR 74 | Ht 62.0 in | Wt 171.5 lb

## 2016-01-20 DIAGNOSIS — N390 Urinary tract infection, site not specified: Secondary | ICD-10-CM | POA: Diagnosis not present

## 2016-01-20 DIAGNOSIS — R3129 Other microscopic hematuria: Secondary | ICD-10-CM

## 2016-01-20 DIAGNOSIS — R350 Frequency of micturition: Secondary | ICD-10-CM | POA: Insufficient documentation

## 2016-01-20 DIAGNOSIS — A499 Bacterial infection, unspecified: Secondary | ICD-10-CM | POA: Diagnosis not present

## 2016-01-20 LAB — URINALYSIS, COMPLETE
Bilirubin, UA: NEGATIVE
GLUCOSE, UA: NEGATIVE
Ketones, UA: NEGATIVE
LEUKOCYTES UA: NEGATIVE
NITRITE UA: NEGATIVE
PH UA: 6 (ref 5.0–7.5)
PROTEIN UA: NEGATIVE
SPEC GRAV UA: 1.02 (ref 1.005–1.030)
Urobilinogen, Ur: 0.2 mg/dL (ref 0.2–1.0)

## 2016-01-20 LAB — MICROSCOPIC EXAMINATION: RBC, UA: NONE SEEN /hpf (ref 0–?)

## 2016-01-20 LAB — BLADDER SCAN AMB NON-IMAGING: SCAN RESULT: 36

## 2016-01-20 NOTE — Progress Notes (Signed)
10:19 AM   Samantha Welch [birth year] [id number]  Referring provider: [facility] [address] [place], [place] [address]  Chief Complaint  Patient presents with  . Follow-up    recheck after UTI    HPI: Patient is a 65 year old Caucasian female who presents today for follow up after an UTI with Raoultella ornithinolytica that was pan sensitive.    When patient presented for her follow up after undergoing a left URS/LL/ureteral stent exchange and removal, she was experiencing dysuria and suprapubic pain.  She was not having gross hematuria or flank pain.  She was not having fevers, chills, nausea or vomiting.  She underwent a renal ultrasound on 01/09/2016 which noted resolution of the left hydronephrosis.  Her urine culture was positive for infection and she was started on Augmentin.    She is currently on the Augmentin and taking Uribel, but she is still experiencing the suprapubic pain, pressure, frequency and urgency.  Her UA was unremarkable today and her PVR was 25mL.  She has not had gross hematuria, fevers, chills, nausea or vomiting. She's not had any flank pain.  PMH: Past Medical History  Diagnosis Date  . Diabetes mellitus without complication ([place])   . GERD (gastroesophageal reflux disease)   . Anxiety   . Hyperlipemia   . COPD (chronic obstructive pulmonary disease) ([place])   . Tobacco abuse disorder   . Depression   . Stomach ulcer   . Chronic kidney disease     Left kidney stone  . Arthritis     Surgical History: Past Surgical History  Procedure Laterality Date  . Tubal ligation    . Cystoscopy with stent placement Left 11/16/2015    Procedure: CYSTOSCOPY WITH LEFT STENT PLACEMENT, LEFT RETROGRADE;  Surgeon: Dereck Leep, MD;  Location: [facility];  Service: Urology;  Laterality: Left;  . Foot surgery Bilateral   . Ureteroscopy with holmium laser lithotripsy Left 12/15/2015    Procedure: URETEROSCOPY WITH  HOLMIUM LASER LITHOTRIPSY;  Surgeon: Hollice Espy, MD;  Location: [facility];  Service: Urology;  Laterality: Left;  . Cystoscopy w/ ureteral stent placement Left 12/15/2015    Procedure: CYSTOSCOPY WITH STENT REPLACEMENT;  Surgeon: Hollice Espy, MD;  Location: [facility];  Service: Urology;  Laterality: Left;    Home Medications:    Medication List       This list is accurate as of: 01/20/16 10:19 AM.  Always use your most recent med list.               amoxicillin-clavulanate 875-125 MG tablet  Commonly known as:  AUGMENTIN  Take 1 tablet by mouth 2 (two) times daily.     aspirin EC 81 MG tablet  Take 81 mg by mouth at bedtime.     cephALEXin 500 MG capsule  Commonly known as:  KEFLEX  Take 1 capsule (500 mg total) by mouth every 12 (twelve) hours.     clonazePAM 1 MG tablet  Commonly known as:  KLONOPIN  Take 1 mg by mouth 2 (two) times daily.     docusate sodium 100 MG capsule  Commonly known as:  COLACE  Take 1 capsule (100 mg total) by mouth 2 (two) times daily.     fluconazole 100 MG tablet  Commonly known as:  DIFLUCAN  Take 1 tablet (100 mg total) by mouth daily. X 7 days     HYDROcodone-acetaminophen 5-325 MG tablet  Commonly known as:  NORCO/VICODIN  Take 1-2 tablets by mouth every 6 (six) hours as needed for moderate pain.     hydrOXYzine 25 MG tablet  Commonly known as:  ATARAX/VISTARIL  Take 25 mg by mouth at bedtime.     metFORMIN 1000 MG tablet  Commonly known as:  GLUCOPHAGE  Take 1,000 mg by mouth every morning.     omega-3 acid ethyl esters 1 g capsule  Commonly known as:  LOVAZA  Take 1-2 g by mouth daily as needed (for cholesterol.). Reported on 01/20/2016     omeprazole 20 MG tablet  Commonly known as:  PRILOSEC OTC  Take 20 mg by mouth daily. Reported on 01/12/2016     ondansetron 4 MG disintegrating tablet  Commonly known as:  ZOFRAN ODT  Take 1 tablet (4 mg total) by mouth every 8 (eight) hours as needed for nausea or vomiting.      oxybutynin 5 MG tablet  Commonly known as:  DITROPAN  Take 1 tablet (5 mg total) by mouth every 8 (eight) hours as needed for bladder spasms.     oxyCODONE 5 MG immediate release tablet  Commonly known as:  Oxy IR/ROXICODONE  Take 1-2 tablets (5-10 mg total) by mouth every 4 (four) hours as needed (1 tablet for pain 4-7/10, 2 tablets for pain 8-10/10).     pravastatin 20 MG tablet  Commonly known as:  PRAVACHOL  Take 20 mg by mouth at bedtime.     tamsulosin 0.4 MG Caps capsule  Commonly known as:  FLOMAX  Take 1 capsule (0.4 mg total) by mouth daily.     tiotropium 18 MCG inhalation capsule  Commonly known as:  SPIRIVA  Place 18 mcg into inhaler and inhale daily.     URIBEL 118 MG Caps  Take 1 capsule (118 mg total) by mouth 4 (four) times daily.     venlafaxine XR 75 MG 24 hr capsule  Commonly known as:  EFFEXOR-XR  Take 75 mg by mouth daily with breakfast.        Allergies: No Known Allergies  Family History: Family History  Problem Relation Age of Onset  . Bladder Cancer Neg Hx   . Kidney cancer Neg Hx   . Prostate cancer Neg Hx   . Colon cancer Brother     Social History:  reports that she has quit smoking. Her smoking use included Cigarettes. She smoked 0.50 packs per day. She has never used smokeless tobacco. She reports that she does not drink alcohol or use illicit drugs.  ROS: UROLOGY Frequent Urination?: No Hard to postpone urination?: No Burning/pain with urination?: No Get up at night to urinate?: Yes Leakage of urine?: Yes Urine stream starts and stops?: No Trouble starting stream?: Yes Do you have to strain to urinate?: No Blood in urine?: No Urinary tract infection?: No Sexually transmitted disease?: No Injury to kidneys or bladder?: No Painful intercourse?: No Weak stream?: No Currently pregnant?: No Vaginal bleeding?: No Last menstrual period?: n  Gastrointestinal Nausea?: No Vomiting?: No Indigestion/heartburn?: No Diarrhea?:  No Constipation?: No  Constitutional Fever: No Night sweats?: No Weight loss?: No Fatigue?: No  Skin Skin rash/lesions?: No Itching?: No  Eyes Blurred vision?: No Double vision?: No  Ears/Nose/Throat Sore throat?: No Sinus problems?: Yes  Hematologic/Lymphatic Swollen glands?: No Easy bruising?: No  Cardiovascular Leg swelling?: No Chest pain?: No  Respiratory Cough?: No Shortness of breath?: No  Endocrine Excessive thirst?: No  Musculoskeletal Back pain?: No Joint pain?: No  Neurological Headaches?:  No Dizziness?: No  Psychologic Depression?: No Anxiety?: No  Physical Exam: BP 136/75 mmHg  Pulse 74  Ht 5\' 2"  (1.575 m)  Wt 171 lb 8 oz (77.792 kg)  BMI 31.36 kg/m2  Constitutional: Well nourished. Alert and oriented, No acute distress. HEENT: [place] AT, moist mucus membranes. Trachea midline, no masses. Cardiovascular: No clubbing, cyanosis, or edema. Respiratory: Normal respiratory effort, no increased work of breathing. GI: Abdomen is soft, non tender, non distended, no abdominal masses.  GU: No CVA tenderness.  No bladder fullness or masses.   Skin: No rashes, bruises or suspicious lesions. Lymph: No cervical or inguinal adenopathy. Neurologic: Grossly intact, no focal deficits, moving all 4 extremities. Psychiatric: Normal mood and affect.  Laboratory Data: Lab Results  Component Value Date   WBC 8.4 11/20/2015   HGB 12.2 11/20/2015   HCT 35.1 11/20/2015   MCV 88.9 11/20/2015   PLT 177 11/20/2015    Lab Results  Component Value Date   CREATININE 0.98 12/02/2015    Lab Results  Component Value Date   AST 23 11/16/2015   Lab Results  Component Value Date   ALT 21 11/16/2015    Urinalysis Results for orders placed or performed in visit on 01/20/16  CULTURE, URINE COMPREHENSIVE  Result Value Ref Range   Urine Culture, Comprehensive Final report    Result 1 Comment   Microscopic Examination  Result Value Ref Range   WBC, UA  0-5 0 -  5 /hpf   RBC, UA None seen 0 -  2 /hpf   Epithelial Cells (non renal) 0-10 0 - 10 /hpf   Bacteria, UA Few (A) None seen/Few  Urinalysis, Complete  Result Value Ref Range   Specific Gravity, UA 1.020 1.005 - 1.030   pH, UA 6.0 5.0 - 7.5   Color, UA Green (A) Yellow   Appearance Ur Clear Clear   Leukocytes, UA Negative Negative   Protein, UA Negative Negative/Trace   Glucose, UA Negative Negative   Ketones, UA Negative Negative   RBC, UA Trace (A) Negative   Bilirubin, UA Negative Negative   Urobilinogen, Ur 0.2 0.2 - 1.0 mg/dL   Nitrite, UA Negative Negative   Microscopic Examination See below:   BLADDER SCAN AMB NON-IMAGING  Result Value Ref Range   Scan Result 36     Pertinent Imaging: Results for MIAJA, NORCOTT (MRN [id number]) as of 01/20/2016 15:29  Ref. Range 01/20/2016 10:07  Scan Result Unknown 36    Assessment & Plan:    Patient underwent a left ureteroscopy with laser lithotripsy with left ureteral stent exchange for definitive treatment of her left proximal ureteral stone on 12/15/2015.   She removed the stent herself 3 days later.    1. Left ureteral stone:   Resolved.  2. Hydronephrosis:   RUS demonstrates resolution of the hydronephrosis.    3. Microscopic hematuria:   Not seen on today's UA.  Patient does not report gross hematuria.  We will continue to monitor.    4. UTI:   Patient is on appropriated antibiotic, Augmentin 875/125, but still with symptoms.  UA is unremarkable today.  PVR was 36 mL.  I will send the urine for culture to ensure resolution of the infection.  She will finish the Augmentin.  I have given her Myrbetriq 25 mg samples in the interim to help with the frequency and urgency.     - Urinalysis, Complete - CULTURE, URINE COMPREHENSIVE  5. Frequency:   PVR is 36 mL.  Given Myrbetriq samples.    Return for pending urine culture results.  These notes generated with voice recognition software. I apologize for typographical  errors.  Samantha Welch, [facility] [address], [place],  [address] [phone]

## 2016-01-21 ENCOUNTER — Other Ambulatory Visit: Payer: Self-pay

## 2016-01-21 DIAGNOSIS — R300 Dysuria: Secondary | ICD-10-CM

## 2016-01-21 MED ORDER — URIBEL 118 MG PO CAPS: 1.0000 | ORAL_CAPSULE | Freq: Four times a day (QID) | ORAL | Status: DC

## 2016-01-21 NOTE — Progress Notes (Signed)
Pt requested refills of Uribel for dysuria. Pt stated she still has a coupon for medication. Refill was given.

## 2016-01-22 LAB — CULTURE, URINE COMPREHENSIVE

## 2016-01-23 ENCOUNTER — Telehealth: Payer: Self-pay

## 2016-01-23 NOTE — Telephone Encounter (Signed)
-----   Message from Nori Riis, PA-C sent at 01/22/2016  6:53 PM EDT ----- Please let patient know that her urine culture was negative.  I would like to see her back in two weeks.

## 2016-01-23 NOTE — Telephone Encounter (Signed)
LMOM- recent labs are negative. See back in 2 weeks.

## 2016-02-10 ENCOUNTER — Ambulatory Visit (INDEPENDENT_AMBULATORY_CARE_PROVIDER_SITE_OTHER): Payer: Medicare Other | Admitting: Urology

## 2016-02-10 ENCOUNTER — Encounter: Payer: Self-pay | Admitting: Urology

## 2016-02-10 VITALS — BP 153/78 | HR 71 | Ht 62.0 in | Wt 173.0 lb

## 2016-02-10 DIAGNOSIS — IMO0001 Reserved for inherently not codable concepts without codable children: Secondary | ICD-10-CM | POA: Insufficient documentation

## 2016-02-10 DIAGNOSIS — M7989 Other specified soft tissue disorders: Secondary | ICD-10-CM

## 2016-02-10 DIAGNOSIS — IMO0002 Reserved for concepts with insufficient information to code with codable children: Secondary | ICD-10-CM

## 2016-02-10 DIAGNOSIS — N8110 Cystocele, unspecified: Secondary | ICD-10-CM

## 2016-02-10 DIAGNOSIS — R3129 Other microscopic hematuria: Secondary | ICD-10-CM

## 2016-02-10 DIAGNOSIS — N390 Urinary tract infection, site not specified: Secondary | ICD-10-CM | POA: Diagnosis not present

## 2016-02-10 DIAGNOSIS — R350 Frequency of micturition: Secondary | ICD-10-CM | POA: Diagnosis not present

## 2016-02-10 LAB — BLADDER SCAN AMB NON-IMAGING: SCAN RESULT: 0

## 2016-02-10 NOTE — Progress Notes (Signed)
5:03 PM   Samantha Welch 03/25/1951 [id number]  Referring provider: [facility] [address] [place], [place] [address]  Chief Complaint  Patient presents with  . Hematuria    2 week follow up  . Urinary Frequency    HPI: Patient is a 65 year old Caucasian female who presents today for follow-up after giving Myrbetriq 25 mg samples for urinary frequency.  Patient states the samples were somewhat effective, but now she is experiencing an urge to urinate which results in the feeling that something is being sucked back up and then she can urinate.  She states this has been going on for the last 2 weeks.  She has not had any gross hematuria, suprapubic pain or back pain.  She denies any fevers, chills, nausea or vomiting.  Her UA is unremarkable on today's exam. Her PVR is 0 mL.  She did mention today that her right leg has been swollen for over the last month.  She is also a finding it more difficult to walk on the leg.  She states it hurts in the calf when she is walking.  She actually feels a lump in the back of her leg.  She states that resting the legs sometimes will provide relief.  Pain can last for several hours.  PMH: Past Medical History  Diagnosis Date  . Diabetes mellitus without complication ([place])   . GERD (gastroesophageal reflux disease)   . Anxiety   . Hyperlipemia   . COPD (chronic obstructive pulmonary disease) ([place])   . Tobacco abuse disorder   . Depression   . Stomach ulcer   . Chronic kidney disease     Left kidney stone  . Arthritis     Surgical History: Past Surgical History  Procedure Laterality Date  . Tubal ligation    . Cystoscopy with stent placement Left 11/16/2015    Procedure: CYSTOSCOPY WITH LEFT STENT PLACEMENT, LEFT RETROGRADE;  Surgeon: Dereck Leep, MD;  Location: [facility];  Service: Urology;  Laterality: Left;  . Foot surgery Bilateral   . Ureteroscopy with holmium laser  lithotripsy Left 12/15/2015    Procedure: URETEROSCOPY WITH HOLMIUM LASER LITHOTRIPSY;  Surgeon: Hollice Espy, MD;  Location: [facility];  Service: Urology;  Laterality: Left;  . Cystoscopy w/ ureteral stent placement Left 12/15/2015    Procedure: CYSTOSCOPY WITH STENT REPLACEMENT;  Surgeon: Hollice Espy, MD;  Location: [facility];  Service: Urology;  Laterality: Left;    Home Medications:    Medication List       This list is accurate as of: 02/10/16  5:03 PM.  Always use your most recent med list.               aspirin EC 81 MG tablet  Take 81 mg by mouth at bedtime.     cephALEXin 500 MG capsule  Commonly known as:  KEFLEX  Take 1 capsule (500 mg total) by mouth every 12 (twelve) hours.     clonazePAM 1 MG tablet  Commonly known as:  KLONOPIN  Take 1 mg by mouth 2 (two) times daily.     docusate sodium 100 MG capsule  Commonly known as:  COLACE  Take 1 capsule (100 mg total) by mouth 2 (two) times daily.     fluconazole 100 MG tablet  Commonly known as:  DIFLUCAN  Take 1 tablet (100 mg total) by mouth daily. X 7 days     HYDROcodone-acetaminophen 5-325 MG  tablet  Commonly known as:  NORCO/VICODIN  Take 1-2 tablets by mouth every 6 (six) hours as needed for moderate pain.     hydrOXYzine 25 MG tablet  Commonly known as:  ATARAX/VISTARIL  Take 25 mg by mouth at bedtime.     ipratropium 0.02 % nebulizer solution  Commonly known as:  ATROVENT  Inhale into the lungs.     metFORMIN 1000 MG tablet  Commonly known as:  GLUCOPHAGE  Take 1,000 mg by mouth every morning.     omega-3 acid ethyl esters 1 g capsule  Commonly known as:  LOVAZA  Take 1-2 g by mouth daily as needed (for cholesterol.). Reported on 01/20/2016     omeprazole 20 MG tablet  Commonly known as:  PRILOSEC OTC  Take 20 mg by mouth daily. Reported on 01/12/2016     ondansetron 4 MG disintegrating tablet  Commonly known as:  ZOFRAN ODT  Take 1 tablet (4 mg total) by mouth every 8 (eight) hours as needed  for nausea or vomiting.     oxybutynin 5 MG tablet  Commonly known as:  DITROPAN  Take 1 tablet (5 mg total) by mouth every 8 (eight) hours as needed for bladder spasms.     oxyCODONE 5 MG immediate release tablet  Commonly known as:  Oxy IR/ROXICODONE  Take 1-2 tablets (5-10 mg total) by mouth every 4 (four) hours as needed (1 tablet for pain 4-7/10, 2 tablets for pain 8-10/10).     pravastatin 20 MG tablet  Commonly known as:  PRAVACHOL  Take 20 mg by mouth at bedtime.     tamsulosin 0.4 MG Caps capsule  Commonly known as:  FLOMAX  Take 1 capsule (0.4 mg total) by mouth daily.     tiotropium 18 MCG inhalation capsule  Commonly known as:  SPIRIVA  Place 18 mcg into inhaler and inhale daily.     URIBEL 118 MG Caps  Take 1 capsule (118 mg total) by mouth 4 (four) times daily.     venlafaxine XR 75 MG 24 hr capsule  Commonly known as:  EFFEXOR-XR  Take 75 mg by mouth daily with breakfast.        Allergies: No Known Allergies  Family History: Family History  Problem Relation Age of Onset  . Bladder Cancer Neg Hx   . Kidney cancer Neg Hx   . Prostate cancer Neg Hx   . Colon cancer Brother     Social History:  reports that she has quit smoking. Her smoking use included Cigarettes. She smoked 0.50 packs per day. She has never used smokeless tobacco. She reports that she does not drink alcohol or use illicit drugs.  ROS: UROLOGY Frequent Urination?: No Hard to postpone urination?: No Burning/pain with urination?: Yes Get up at night to urinate?: Yes Leakage of urine?: No Urine stream starts and stops?: Yes Trouble starting stream?: Yes Do you have to strain to urinate?: No Blood in urine?: No Urinary tract infection?: No Sexually transmitted disease?: No Injury to kidneys or bladder?: No Painful intercourse?: No Weak stream?: No Currently pregnant?: No Vaginal bleeding?: No Last menstrual period?: n  Gastrointestinal Nausea?: No Vomiting?:  No Indigestion/heartburn?: Yes Diarrhea?: No Constipation?: Yes  Constitutional Fever: No Night sweats?: Yes Weight loss?: No Fatigue?: No  Skin Skin rash/lesions?: No Itching?: No  Eyes Blurred vision?: No Double vision?: No  Ears/Nose/Throat Sore throat?: No Sinus problems?: No  Hematologic/Lymphatic Swollen glands?: No Easy bruising?: No  Cardiovascular Leg swelling?: Yes Chest  pain?: No  Respiratory Cough?: No Shortness of breath?: No  Endocrine Excessive thirst?: No  Musculoskeletal Back pain?: No Joint pain?: Yes  Neurological Headaches?: No Dizziness?: No  Psychologic Depression?: No Anxiety?: Yes  Physical Exam: BP 153/78 mmHg  Pulse 71  Ht 5\' 2"  (1.575 m)  Wt 173 lb (78.472 kg)  BMI 31.63 kg/m2  Constitutional: Well nourished. Alert and oriented, No acute distress. HEENT: [place] AT, moist mucus membranes. Trachea midline, no masses. Cardiovascular: No clubbing, cyanosis, or edema.  Right leg is larger than the left.  Right calf is tender.   Respiratory: Normal respiratory effort, no increased work of breathing. GI: Abdomen is soft, non tender, non distended, no abdominal masses. Liver and spleen not palpable.  No hernias appreciated.  Stool sample for occult testing is not indicated.   GU: No CVA tenderness.  No bladder fullness or masses.  Atrophic external genitalia, normal pubic hair distribution, no lesions.  Normal urethral meatus, no lesions, no prolapse, no discharge.   No urethral masses, tenderness and/or tenderness. No bladder fullness, tenderness or masses. Normal vagina mucosa, good estrogen effect, no discharge, no lesions, good pelvic support, Grade II cystocele is noted.  No rectocele is noted.  No cervical motion tenderness.  Uterus is freely mobile and non-fixed.  No adnexal/parametria masses or tenderness noted.  Anus and perineum are without rashes or lesions.   Skin: No rashes, bruises or suspicious lesions. Lymph: No cervical  or inguinal adenopathy. Neurologic: Grossly intact, no focal deficits, moving all 4 extremities. Psychiatric: Normal mood and affect.  Laboratory Data: Lab Results  Component Value Date   WBC 8.4 11/20/2015   HGB 12.2 11/20/2015   HCT 35.1 11/20/2015   MCV 88.9 11/20/2015   PLT 177 11/20/2015    Lab Results  Component Value Date   CREATININE 0.98 12/02/2015    Lab Results  Component Value Date   AST 23 11/16/2015   Lab Results  Component Value Date   ALT 21 11/16/2015    Pertinent Imaging: Results for DELICIA, LAMP (MRN [id number]) as of 02/10/2016 16:19  Ref. Range 02/10/2016 15:59  Scan Result Unknown 0     Assessment & Plan:    Patient underwent a left ureteroscopy with laser lithotripsy with left ureteral stent exchange for definitive treatment of her left proximal ureteral stone on 12/15/2015.   She removed the stent herself 3 days later.    1. Left ureteral stone:   Resolved.  2. Hydronephrosis:   RUS demonstrates resolution of the hydronephrosis.    3. Microscopic hematuria:   Not seen on today's UA.  Patient does not report gross hematuria.  We will continue to monitor.    4. UTI:  UA is unremarkable today.  PVR was 0 mL.      - Urinalysis, Complete  5. Frequency:   PVR is 0 mL.  Myrbetriq was somewhat effective.  6. Right leg swelling:   Patient is advised to go to the ED to be evaluated for a possible DVT.    7. Cystocele:   Grade II cystocele is noted on exam.  We discussed conservative management, PT and/or pessary fitting.  She would like to try PT.  We will make the referral for the patient.    Return for to ED for evaluation for DVT.  These notes generated with voice recognition software. I apologize for typographical errors.  Zara Council, [facility] [address], [place], [place] [address] [phone]

## 2016-02-11 ENCOUNTER — Encounter: Payer: Self-pay | Admitting: Emergency Medicine

## 2016-02-11 ENCOUNTER — Telehealth: Payer: Self-pay | Admitting: Urology

## 2016-02-11 ENCOUNTER — Emergency Department
Admission: EM | Admit: 2016-02-11 | Discharge: 2016-02-11 | Disposition: A | Discharge status: Home / Self Care | Payer: Medicare Other | Attending: Emergency Medicine | Admitting: Emergency Medicine

## 2016-02-11 ENCOUNTER — Emergency Department: Payer: Medicare Other

## 2016-02-11 DIAGNOSIS — E1122 Type 2 diabetes mellitus with diabetic chronic kidney disease: Secondary | ICD-10-CM | POA: Diagnosis not present

## 2016-02-11 DIAGNOSIS — Z79899 Other long term (current) drug therapy: Secondary | ICD-10-CM | POA: Insufficient documentation

## 2016-02-11 DIAGNOSIS — M7121 Synovial cyst of popliteal space [Baker], right knee: Secondary | ICD-10-CM | POA: Insufficient documentation

## 2016-02-11 DIAGNOSIS — N189 Chronic kidney disease, unspecified: Secondary | ICD-10-CM | POA: Diagnosis not present

## 2016-02-11 DIAGNOSIS — Z7982 Long term (current) use of aspirin: Secondary | ICD-10-CM | POA: Insufficient documentation

## 2016-02-11 DIAGNOSIS — M79604 Pain in right leg: Secondary | ICD-10-CM | POA: Diagnosis present

## 2016-02-11 DIAGNOSIS — Z7984 Long term (current) use of oral hypoglycemic drugs: Secondary | ICD-10-CM | POA: Diagnosis not present

## 2016-02-11 DIAGNOSIS — M1990 Unspecified osteoarthritis, unspecified site: Secondary | ICD-10-CM | POA: Insufficient documentation

## 2016-02-11 DIAGNOSIS — J449 Chronic obstructive pulmonary disease, unspecified: Secondary | ICD-10-CM | POA: Diagnosis not present

## 2016-02-11 DIAGNOSIS — Z87891 Personal history of nicotine dependence: Secondary | ICD-10-CM | POA: Diagnosis not present

## 2016-02-11 DIAGNOSIS — F329 Major depressive disorder, single episode, unspecified: Secondary | ICD-10-CM | POA: Insufficient documentation

## 2016-02-11 DIAGNOSIS — E785 Hyperlipidemia, unspecified: Secondary | ICD-10-CM | POA: Insufficient documentation

## 2016-02-11 LAB — URINALYSIS, COMPLETE
BILIRUBIN UA: NEGATIVE
LEUKOCYTES UA: NEGATIVE
Nitrite, UA: NEGATIVE
PH UA: 5.5 (ref 5.0–7.5)
PROTEIN UA: NEGATIVE
Specific Gravity, UA: 1.025 (ref 1.005–1.030)
Urobilinogen, Ur: 0.2 mg/dL (ref 0.2–1.0)

## 2016-02-11 LAB — MICROSCOPIC EXAMINATION
BACTERIA UA: NONE SEEN
WBC, UA: NONE SEEN /hpf (ref 0–?)

## 2016-02-11 MED ORDER — IBUPROFEN 600 MG PO TABS: 600.0000 mg | ORAL_TABLET | Freq: Four times a day (QID) | ORAL | Status: DC | PRN

## 2016-02-11 MED ORDER — TRAMADOL HCL 50 MG PO TABS: 50.0000 mg | ORAL_TABLET | Freq: Four times a day (QID) | ORAL | Status: DC | PRN

## 2016-02-11 NOTE — ED Provider Notes (Signed)
[facility] Emergency Department Provider Note  Time seen: 12:30 PM  I have reviewed the triage vital signs and the nursing notes.   HISTORY  Chief Complaint Leg Pain    HPI Samantha Welch is a 65 y.o. female with a past medical history of diabetes, gastric reflux, anxiety, hyperlipidemia, COPD, arthritis, CK D, presents the emergency department with right lower extremity pain. According to the patient for the past 2-3 weeks she has been expressing right lower extremity pain which she describes as a dull discomfort behind the back of her knee, moderate in severity worse with ambulation. She also states swelling. Denies chest pain or trouble breathing. Patient went to her doctor this morning, who sent her to the emergency department for evaluation.     Past Medical History  Diagnosis Date  . Diabetes mellitus without complication ([place])   . GERD (gastroesophageal reflux disease)   . Anxiety   . Hyperlipemia   . COPD (chronic obstructive pulmonary disease) ([place])   . Tobacco abuse disorder   . Depression   . Stomach ulcer   . Chronic kidney disease     Left kidney stone  . Arthritis     Patient Active Problem List   Diagnosis Date Noted  . Cystocele, grade 2 02/10/2016  . Recurrent UTI 02/10/2016  . UTI (urinary tract infection), bacterial 01/20/2016  . Urinary frequency 01/20/2016  . Hydronephrosis with urinary obstruction due to ureteral calculus 12/01/2015  . Microscopic hematuria 12/01/2015  . Left flank pain   . UTI (lower urinary tract infection)   . Left ureteral stone 11/16/2015    Past Surgical History  Procedure Laterality Date  . Tubal ligation    . Cystoscopy with stent placement Left 11/16/2015    Procedure: CYSTOSCOPY WITH LEFT STENT PLACEMENT, LEFT RETROGRADE;  Surgeon: Dereck Leep, MD;  Location: [facility];  Service: Urology;  Laterality: Left;  . Foot surgery Bilateral   . Ureteroscopy with holmium laser lithotripsy Left  12/15/2015    Procedure: URETEROSCOPY WITH HOLMIUM LASER LITHOTRIPSY;  Surgeon: Hollice Espy, MD;  Location: [facility];  Service: Urology;  Laterality: Left;  . Cystoscopy w/ ureteral stent placement Left 12/15/2015    Procedure: CYSTOSCOPY WITH STENT REPLACEMENT;  Surgeon: Hollice Espy, MD;  Location: [facility];  Service: Urology;  Laterality: Left;    Current Outpatient Rx  Name  Route  Sig  Dispense  Refill  . aspirin EC 81 MG tablet   Oral   Take 81 mg by mouth at bedtime.         . cephALEXin (KEFLEX) 500 MG capsule   Oral   Take 1 capsule (500 mg total) by mouth every 12 (twelve) hours. Patient not taking: Reported on 01/12/2016   20 capsule   0   . clonazePAM (KLONOPIN) 1 MG tablet   Oral   Take 1 mg by mouth 2 (two) times daily.         Marland Kitchen docusate sodium (COLACE) 100 MG capsule   Oral   Take 1 capsule (100 mg total) by mouth 2 (two) times daily. Patient not taking: Reported on 12/15/2015   10 capsule   0   . fluconazole (DIFLUCAN) 100 MG tablet   Oral   Take 1 tablet (100 mg total) by mouth daily. X 7 days Patient not taking: Reported on 01/12/2016   7 tablet   0   . HYDROcodone-acetaminophen (NORCO/VICODIN) 5-325 MG tablet   Oral   Take 1-2 tablets by  mouth every 6 (six) hours as needed for moderate pain.   15 tablet   0   . hydrOXYzine (ATARAX/VISTARIL) 25 MG tablet   Oral   Take 25 mg by mouth at bedtime.         Marland Kitchen ipratropium (ATROVENT) 0.02 % nebulizer solution   Inhalation   Inhale into the lungs.         . metFORMIN (GLUCOPHAGE) 1000 MG tablet   Oral   Take 1,000 mg by mouth every morning.         . Meth-Hyo-M Bl-Na Phos-Ph Sal (URIBEL) 118 MG CAPS   Oral   Take 1 capsule (118 mg total) by mouth 4 (four) times daily.   40 capsule   3   . omega-3 acid ethyl esters (LOVAZA) 1 g capsule   Oral   Take 1-2 g by mouth daily as needed (for cholesterol.). Reported on 01/20/2016         . omeprazole (PRILOSEC OTC) 20 MG tablet   Oral   Take  20 mg by mouth daily. Reported on 01/12/2016         . ondansetron (ZOFRAN ODT) 4 MG disintegrating tablet   Oral   Take 1 tablet (4 mg total) by mouth every 8 (eight) hours as needed for nausea or vomiting. Patient not taking: Reported on 01/12/2016   20 tablet   0   . oxybutynin (DITROPAN) 5 MG tablet   Oral   Take 1 tablet (5 mg total) by mouth every 8 (eight) hours as needed for bladder spasms. Patient not taking: Reported on 01/20/2016   30 tablet   0   . oxyCODONE (OXY IR/ROXICODONE) 5 MG immediate release tablet   Oral   Take 1-2 tablets (5-10 mg total) by mouth every 4 (four) hours as needed (1 tablet for pain 4-7/10, 2 tablets for pain 8-10/10). Patient not taking: Reported on 01/12/2016   30 tablet   0   . pravastatin (PRAVACHOL) 20 MG tablet   Oral   Take 20 mg by mouth at bedtime.         . tamsulosin (FLOMAX) 0.4 MG CAPS capsule   Oral   Take 1 capsule (0.4 mg total) by mouth daily. Patient not taking: Reported on 01/12/2016   14 capsule   0   . tiotropium (SPIRIVA) 18 MCG inhalation capsule   Inhalation   Place 18 mcg into inhaler and inhale daily.         Marland Kitchen venlafaxine XR (EFFEXOR-XR) 75 MG 24 hr capsule   Oral   Take 75 mg by mouth daily with breakfast.           Allergies Review of patient's allergies indicates no known allergies.  Family History  Problem Relation Age of Onset  . Bladder Cancer Neg Hx   . Kidney cancer Neg Hx   . Prostate cancer Neg Hx   . Colon cancer Brother     Social History Social History  Substance Use Topics  . Smoking status: Former Smoker -- 0.50 packs/day    Types: Cigarettes  . Smokeless tobacco: Never Used     Comment: 10/2015  . Alcohol Use: No    Review of Systems Constitutional: Negative for fever. Cardiovascular: Negative for chest pain. Respiratory: Negative for shortness of breath. Gastrointestinal: Negative for abdominal pain Musculoskeletal: Right leg pain. Neurological: Negative for  headache 10-point ROS otherwise negative.  ____________________________________________   PHYSICAL EXAM:  VITAL SIGNS: ED Triage Vitals  Enc Vitals  Group     BP 02/11/16 1059 133/73 mmHg     Pulse Rate 02/11/16 1059 90     Resp 02/11/16 1059 18     Temp 02/11/16 1059 98.5 F (36.9 C)     Temp Source 02/11/16 1059 Oral     SpO2 02/11/16 1059 96 %     Weight 02/11/16 1059 173 lb (78.472 kg)     Height 02/11/16 1059 5\' 2"  (1.575 m)     Head Cir --      Peak Flow --      Pain Score 02/11/16 1100 9     Pain Loc --      Pain Edu? --      Excl. in [place]? --    Constitutional: Alert and oriented. Well appearing and in no distress. Eyes: Normal exam ENT   Head: Normocephalic and atraumatic   Mouth/Throat: Mucous membranes are moist. Cardiovascular: Normal rate, regular rhythm. Respiratory: Normal respiratory effort without tachypnea nor retractions. Breath sounds are clear  Gastrointestinal: Soft and nontender. No distention.  Musculoskeletal: Mild tenderness to palpation in the popliteal fossa, no calf tenderness, no appreciable swelling. Neurologic:  Normal speech and language. No gross focal neurologic deficits Skin:  Skin is warm, dry and intact.  Psychiatric: Mood and affect are normal.   ____________________________________________     RADIOLOGY  Positive for popliteal cyst, negative for DVT.  ____________________________________________   INITIAL IMPRESSION / ASSESSMENT AND PLAN / ED COURSE  Pertinent labs & imaging results that were available during my care of the patient were reviewed by me and considered in my medical decision making (see chart for details).  The patient presents to the emergency department with right lower extremity pain and subjective swelling, no appreciable swelling on exam. Mild popliteal fossa tenderness to palpation. 2+ DP pulse and sensation intact. No history of DVT in the past. Patient takes a baby aspirin once daily. We will  obtain also the right lower extremity to rule out DVT.  Ultrasound negative for DVT but does show a fairly large popliteal cyst which is likely the cause of the patient's discomfort. I discussed taking anti-inflammatory medications with the patient, will also prescribe tramadol as needed for discomfort. Likely Baker's cyst. We will refer to orthopedics for further treatment.  ____________________________________________   FINAL CLINICAL IMPRESSION(S) / ED DIAGNOSES  Right lower extremity pain  popliteal cyst    Harvest Dark, MD 02/11/16 1358

## 2016-02-11 NOTE — ED Notes (Signed)
Patient transported to Ultrasound 

## 2016-02-11 NOTE — ED Notes (Signed)
Pt complains of knot to back of right knee. Pt states it has been there for several days and is very painful. Pt was seen at PCP this AM and sent here for further evaluation for possible DVT.

## 2016-02-11 NOTE — ED Notes (Signed)
Pt returned from [place], resting in bed in no acute distress

## 2016-02-11 NOTE — ED Notes (Signed)
Pt states right knee pain and swelling, states her urologist sent her for evaluation of possible DVT, states pain for 3 weeks, denies any airplane or long distance car rides, pt is hx of smoker, awake and alert in no cute distress

## 2016-02-11 NOTE — Telephone Encounter (Signed)
Patient called today and said that she wanted an order for an [place] because she didn't want to have to go and wait hours at the walk in clinic and they said that if we gave her an order/referral she could get right in. I explained to her per her visit with Larene Beach on 02-10-16 she was advised to report to the ED for a possible DVT. We could not treat her for that because we did not treat that here and that it was something her PCP handled. She didn't want to go to her PCP or the ED so I told her that she needed to go and that if she didn't she could possibly throw a clot and could possibly die from that. I explained again that she needed to go to the ED and explain to them her symptoms and they will take care of her and that she does not need a referral or an order to be seen at the ED. She said ok and so I am hoping that she will go.   Thanks,  Sharyn Lull

## 2016-02-11 NOTE — Discharge Instructions (Signed)
Samantha Welch °A Samantha Welch is a sac-like structure that forms in the back of the knee. It is filled with the same fluid that is located in your knee. This fluid lubricates the bones and cartilage of the knee and allows them to move over each other more easily. °CAUSES  °When the knee becomes injured or inflamed, increased fluid forms in the knee. When this happens, the joint lining is pushed out behind the knee and forms the Samantha Welch. This Welch may also be caused by inflammation from arthritic conditions and infections. °SIGNS AND SYMPTOMS  °A Samantha Welch usually has no symptoms. When the Welch is substantially enlarged: °· You may feel pressure behind the knee, stiffness in the knee, or a mass in the area behind the knee. °· You may develop pain, redness, and swelling in the calf.  This can suggest a blood clot and requires evaluation by your health care provider. °DIAGNOSIS  °A Samantha Welch is most often found during an ultrasound exam. This exam may have been performed for other reasons, and the Welch was found incidentally. Sometimes an MRI is used. This picks up other problems within a joint that an ultrasound exam may not. If the Samantha Welch developed immediately after an injury, X-ray exams may be used to diagnose the Welch. °TREATMENT  °The treatment depends on the cause of the Welch. Anti-inflammatory medicines and rest often will be prescribed. If the Welch is caused by a bacterial infection, antibiotic medicines may be prescribed.  °HOME CARE INSTRUCTIONS  °· If the Welch was caused by an injury, for the first 24 hours, keep the injured leg elevated on 2 pillows while lying down. °· For the first 24 hours while you are awake, apply ice to the injured area: °¨ Put ice in a plastic bag. °¨ Place a towel between your skin and the bag. °¨ Leave the ice on for 20 minutes, 2-3 times a day. °· Only take over-the-counter or prescription medicines for pain, discomfort, or fever as directed by your health care  provider. °· Only take antibiotic medicine as directed. Make sure to finish it even if you start to feel better. °MAKE SURE YOU:  °· Understand these instructions. °· Will watch your condition. °· Will get help right away if you are not doing well or get worse. °  °This information is not intended to replace advice given to you by your health care provider. Make sure you discuss any questions you have with your health care provider. °  °Document Released: 08/30/2005 Document Revised: 06/20/2013 Document Reviewed: 04/11/2013 °Elsevier Interactive Patient Education ©2016 [employer] ° °

## 2016-02-12 ENCOUNTER — Encounter: Payer: Self-pay | Admitting: *Deleted

## 2016-02-12 ENCOUNTER — Other Ambulatory Visit: Payer: Self-pay | Admitting: *Deleted

## 2016-02-12 NOTE — Progress Notes (Signed)
Pt came to [facility] stating she had an apt with our providers today-ref by ED dept yesterday.  Chart reviewed. Explained that no apt referral was sent to our clinic. It appears that she was referred to orthopedics. Pt redirected to that dept.Marland Kitchen

## 2016-02-13 ENCOUNTER — Other Ambulatory Visit: Payer: Self-pay | Admitting: Specialist

## 2016-02-13 DIAGNOSIS — M7122 Synovial cyst of popliteal space [Baker], left knee: Principal | ICD-10-CM

## 2016-02-13 DIAGNOSIS — M7121 Synovial cyst of popliteal space [Baker], right knee: Secondary | ICD-10-CM

## 2016-03-09 ENCOUNTER — Ambulatory Visit
Admission: RE | Admit: 2016-03-09 | Discharge: 2016-03-09 | Disposition: A | Discharge status: Home / Self Care | Payer: Medicare Other | Source: Ambulatory Visit | Attending: Specialist | Admitting: Specialist

## 2016-03-09 DIAGNOSIS — S83281A Other tear of lateral meniscus, current injury, right knee, initial encounter: Secondary | ICD-10-CM | POA: Insufficient documentation

## 2016-03-09 DIAGNOSIS — M7121 Synovial cyst of popliteal space [Baker], right knee: Secondary | ICD-10-CM | POA: Insufficient documentation

## 2016-03-09 DIAGNOSIS — X58XXXA Exposure to other specified factors, initial encounter: Secondary | ICD-10-CM | POA: Diagnosis not present

## 2016-03-09 DIAGNOSIS — M7122 Synovial cyst of popliteal space [Baker], left knee: Secondary | ICD-10-CM

## 2016-03-09 DIAGNOSIS — M25461 Effusion, right knee: Secondary | ICD-10-CM | POA: Insufficient documentation

## 2016-03-09 DIAGNOSIS — S83241A Other tear of medial meniscus, current injury, right knee, initial encounter: Secondary | ICD-10-CM | POA: Diagnosis not present

## 2016-03-31 ENCOUNTER — Encounter: Payer: Self-pay | Admitting: Physical Therapy

## 2016-03-31 ENCOUNTER — Ambulatory Visit: Payer: Medicare Other | Attending: Urology | Admitting: Physical Therapy

## 2016-03-31 DIAGNOSIS — R2689 Other abnormalities of gait and mobility: Secondary | ICD-10-CM | POA: Diagnosis not present

## 2016-03-31 DIAGNOSIS — M545 Low back pain: Secondary | ICD-10-CM | POA: Insufficient documentation

## 2016-03-31 DIAGNOSIS — M6281 Muscle weakness (generalized): Secondary | ICD-10-CM

## 2016-03-31 DIAGNOSIS — M25561 Pain in right knee: Secondary | ICD-10-CM | POA: Diagnosis present

## 2016-03-31 NOTE — Patient Instructions (Addendum)
   2.   Handout body mechanics with sweeping., wiping counters without bending forward    Avoid holding your breath when Getting out of the chair:  Scoot to front half of thethe chair Heels behind knee, nose over toes  Inhale like you are smelling roses Exhale to stand      3. You are now ready to begin training the deep core muscles system: diaphragm, transverse abdominis, pelvic floor . These muscles must work together as a team.      The key to these exercises to train the brain to coordinate the timing of these muscles and to have them turn on for long periods of time to hold you upright against gravity (especially important if you are on your feet all day).These muscles are postural muscles and play a role stabilizing your spine and bodyweight. By doing these repetitions slowly and correctly instead of doing crunches, you will achieve a flatter belly without a lower pooch. You are also placing your spine in a more neutral position and breathing properly which in turn, decreases your risk for problems related to your pelvic floor, abdominal, and low back such as pelvic organ prolapse, hernias, diastasis recti (separation of superficial muscles), disk herniations, spinal fractures. These exercises set a solid foundation for you to later progress to resistance/ strength training with therabands and weights and return to other typical fitness exercises with a stronger deeper core.   Do level 1 (20 breaths in morning and night)

## 2016-04-02 NOTE — Therapy (Addendum)
[place] MAIN [facility] SERVICES [address] [place], [place], [address] Phone: [phone]   Fax:  [phone]  Physical Therapy Evaluation  Patient Details  Name: Samantha Welch MRN: [id number] Date of Birth: 1951/05/06 Referring Provider: Ernestine Conrad   Encounter Date: 03/31/2016      PT End of Session - 04/01/16 2356    Visit Number 1   Number of Visits 12   Date for PT Re-Evaluation 06/30/16   Authorization Type g code 1/10   PT Start Time 1600   PT Stop Time [id number]   PT Time Calculation (min) 75 min   Activity Tolerance Patient tolerated treatment well;No increased pain   Behavior During Therapy [facility] for tasks assessed/performed      Past Medical History  Diagnosis Date  . Diabetes mellitus without complication ([place])   . GERD (gastroesophageal reflux disease)   . Anxiety   . Hyperlipemia   . COPD (chronic obstructive pulmonary disease) ([place])   . Tobacco abuse disorder   . Depression   . Stomach ulcer   . Chronic kidney disease     Left kidney stone  . Arthritis     Past Surgical History  Procedure Laterality Date  . Tubal ligation    . Cystoscopy with stent placement Left 11/16/2015    Procedure: CYSTOSCOPY WITH LEFT STENT PLACEMENT, LEFT RETROGRADE;  Surgeon: Dereck Leep, MD;  Location: [facility] ORS;  Service: Urology;  Laterality: Left;  . Foot surgery Bilateral   . Ureteroscopy with holmium laser lithotripsy Left 12/15/2015    Procedure: URETEROSCOPY WITH HOLMIUM LASER LITHOTRIPSY;  Surgeon: Hollice Espy, MD;  Location: [facility] ORS;  Service: Urology;  Laterality: Left;  . Cystoscopy w/ ureteral stent placement Left 12/15/2015    Procedure: CYSTOSCOPY WITH STENT REPLACEMENT;  Surgeon: Hollice Espy, MD;  Location: [facility] ORS;  Service: Urology;  Laterality: Left;    There were no vitals filed for this visit.       Subjective Assessment - 04/01/16 2348    Subjective 1) pelvic pain for the past 2 months: Pt reported she  kept getting infections after her kidney stones and her urologist (referring provider) told her that her bladder "dropped" which was causing her pain.  Current pain level 6/10. At worst : 9/10 that interrupts sleep.  Pain has worsened the past three days. Pain is constant and not position dependent.  Pt describes the pain feels "heavy like you have to hold yourself up."   Pain occurs with urination recently after she ran out of pain pills. Pt thinks she has an infection still. Pt had a URETEROSCOPY WITH HOLMIUM LASER LITHOTRIPSY and CYSTOSCOPY WITH STENT REPLACEMENT in 12/2015.  2) constipation: bowel movements occur every other day and straining is required sometimes.  Daily water intake 2-3 (16 fl oz) / day, 1 zero coke (12 fl oz), 1 cup coffee   3) knee pain that started 3 months ago with difficulty walking,  standing, stairclimbing, sit to stand. 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       Pertinent History 2 vaginal deliveries with stitches on both, tibal ligation,  URETEROSCOPY WITH HOLMIUM LASER LITHOTRIPSY and CYSTOSCOPY WITH STENT REPLACEMENT;  12/2015, IMPORTANT NOTES: After surgeries related kidney stone in April, pt noticed swelling in her R knee and diagnostic tests have ruled out DVT. and an MRI  showed hairline fracture (Dr. Sabra Heck). Pt was splinted a month ago, currently  she wears a knee brace that restricts knee flexion 50 deg from extension. Dr. Sabra Heck plans to give her a cortisone shot.      Patient Stated Goals decrease pain, tolerable level 2-3/10             [facility] PT Assessment - 04/01/16 2351    Assessment   Medical Diagnosis urinary frequency   Referring Provider [place]    Precautions   Precautions None   Restrictions   Weight Bearing Restrictions No   Balance Screen   Has the patient fallen in the past 6 months Yes   How many times? 1 when "leg moved behind you, goes backwards"    Home Environment   Additional Comments 4 steps  with rail STE    Prior Function   Level of Independence Independent   Sit to Stand   Comments breathholding with sit to stand and excessive BUE on armrests , poor alignment    Other:   Other/ Comments poor body mechancis with household chores   AROM   Overall AROM Comments R knee flexion 60 deg in sitting . Plan to measure knee flexion in hooklying at next session   Palpation   SI assessment  L PSIS limited mobility, L ASIS higher,   tenderness and tensions decreased post Tx                   [facility] Adult PT Treatment/Exercise - 04/01/16 2351    Ambulation/Gait   Gait Comments knee brace with decreased swing phase on R,      Gait speed 0.58 m/s           PT Long Term Goals - 04/07/16 [id number]      PT LONG TERM GOAL #1   Title Pt will report decreased frequencies of intense pain episiodes of level 9/10 to once a month in order to improve sleep   Time 12   Period Weeks   Status New     PT LONG TERM GOAL #2   Title Pt will demo proper toileting mechanics to decrease straining of pelvic floor mm.    Time 12   Period Weeks   Status New     PT LONG TERM GOAL #3   Title Pt will increase her gait speed from 0.58 m/s to >  1.2 m/s in order to ambulate safely when crossing the street.   Time 12   Period Weeks   Status New     PT LONG TERM GOAL #4   Title Pt will decrease her score on [place] 72% to < 50% in order to improve QOL   Time 12   Period Weeks   Status New     PT LONG TERM GOAL #5   Title Pt will decrease her score PFDI 75% to < 70% in order to demo improved pelvic floor function.   Time 12   Period Weeks   Status New                      Plan - 04/01/16 2357    Clinical Impression Statement Pt is a 65 yo female who c/o pelvic and knee pain in addition to constipation. Pt 's clinical presentations include pelvic obliquities, gait deviations, poor mechanics with toileting and household chores, and dyscoordination of deep core mm. Pt's personal  factors that contribute to her Sx include 3 months s/p urethoscopy and cystoscopy and stent replacement, hairline fracture of R femur for which she  has been ordered to wear a brace to limit knee flexion currently.    Rehab Potential Good   PT Frequency 1x / week   PT Duration 12 weeks   PT Treatment/Interventions ADLs/Self Care Home Management;Aquatic Therapy;Cryotherapy;Electrical Stimulation;Moist Heat;Traction;Gait training;Neuromuscular re-education;Balance training;Therapeutic exercise;Therapeutic activities;Functional mobility training;Stair training;Patient/family education;Orthotic Fit/Training;Manual techniques;Taping;Splinting;Energy conservation;Passive range of motion;Scar mobilization;Manual lymph drainage   Consulted and Agree with Plan of Care Patient      Patient will benefit from skilled therapeutic intervention in order to improve the following deficits and impairments:  Abnormal gait, Pain, Improper body mechanics, Decreased coordination, Increased muscle spasms, Postural dysfunction, Decreased mobility, Decreased activity tolerance, Decreased strength, Decreased range of motion, Decreased endurance, Decreased balance, Decreased safety awareness, Difficulty walking, Impaired flexibility  Visit Diagnosis: Other abnormalities of gait and mobility  Muscle weakness (generalized)  Midline low back pain, with sciatica presence unspecified  Pain in right knee      G-Codes - 2016/04/03 2359    Functional Assessment Tool Used Zung Anxiety , PFDI    , walking speed    Functional Limitation Mobility: Walking and moving around   Mobility: Walking and Moving Around Current Status [id number]) At least 60 percent but less than 80 percent impaired, limited or restricted   Mobility: Walking and Moving Around Goal Status [phone]) At least 40 percent but less than 60 percent impaired, limited or restricted       Problem List Patient Active Problem List   Diagnosis Date Noted  . Cystocele,  grade 2 02/10/2016  . Recurrent UTI 02/10/2016  . UTI (urinary tract infection), bacterial 01/20/2016  . Urinary frequency 01/20/2016  . Hydronephrosis with urinary obstruction due to ureteral calculus 12/01/2015  . Microscopic hematuria 12/01/2015  . Left flank pain   . UTI (lower urinary tract infection)   . Left ureteral stone 11/16/2015    Jerl Mina ,PT, DPT, E-RYT  04/02/2016, 12:01 AM  [place] MAIN [facility] SERVICES [address] [place], [place], [address] Phone: [phone]   Fax:  [phone]  Name: DYMON HAYE MRN: [id number] Date of Birth: 1951-06-27

## 2016-04-06 ENCOUNTER — Ambulatory Visit: Payer: Medicare Other | Admitting: Physical Therapy

## 2016-04-06 DIAGNOSIS — R2689 Other abnormalities of gait and mobility: Secondary | ICD-10-CM | POA: Diagnosis not present

## 2016-04-06 DIAGNOSIS — M25561 Pain in right knee: Secondary | ICD-10-CM

## 2016-04-06 DIAGNOSIS — M545 Low back pain: Secondary | ICD-10-CM

## 2016-04-06 DIAGNOSIS — M6281 Muscle weakness (generalized): Secondary | ICD-10-CM

## 2016-04-06 NOTE — Patient Instructions (Signed)
Log rolling  Breathing with pelvic floor for toileting  (see handout)    Figure-4 stretch,  L leg only for this week  5 breath each       Cross -over stretch  L leg only this week 5 breath each

## 2016-04-07 NOTE — Addendum Note (Signed)
Addended by: Jerl Mina on: 04/07/2016 12:58 PM   Modules accepted: Orders

## 2016-04-13 ENCOUNTER — Ambulatory Visit: Payer: Medicare Other | Attending: Urology | Admitting: Physical Therapy

## 2016-04-13 DIAGNOSIS — M545 Low back pain: Secondary | ICD-10-CM

## 2016-04-13 DIAGNOSIS — M25561 Pain in right knee: Secondary | ICD-10-CM | POA: Diagnosis present

## 2016-04-13 DIAGNOSIS — R2689 Other abnormalities of gait and mobility: Secondary | ICD-10-CM | POA: Diagnosis not present

## 2016-04-13 DIAGNOSIS — M6281 Muscle weakness (generalized): Secondary | ICD-10-CM

## 2016-04-13 NOTE — Patient Instructions (Addendum)
Preventing kidney stones: [email or web address]  Clam Shell 45 Degrees   Lying with hips and knees bent 45, one pillow between knees and ankles. Lift knee with exhale. Be sure pelvis does not roll backward. Do not arch back. Do 15 , each leg, 2 times per day.  http://ss.exer.us/75   Copyright  VHI. All rights reserved.     Continue with the breathing with more expansion of ribcage on inhale, exhale feel  Lifting of pelvic floor naturally    With coughing, squeeze the pelvic floor to minimize worsening of pelvic organ prolapse  15 x morning and evening     Start back to walking 10 min per day every day this week

## 2016-04-13 NOTE — Therapy (Signed)
[place] MAIN [facility] SERVICES [address] [place], [place], [address] Phone: [phone]   Fax:  [phone]  Physical Therapy Treatment  Patient Details  Name: Samantha Welch MRN: [id number] Date of Birth: [birth year] Referring Provider: Ernestine Conrad   Encounter Date: 04/13/2016      PT End of Session - 04/13/16 1503    Visit Number 3   Number of Visits 12   Date for PT Re-Evaluation 06/30/16   Authorization Type 3/10    PT Start Time 1400   PT Stop Time 1450   PT Time Calculation (min) 50 min   Activity Tolerance Patient tolerated treatment well;No increased pain   Behavior During Therapy WFL for tasks assessed/performed      Past Medical History:  Diagnosis Date  . Anxiety   . Arthritis   . Chronic kidney disease    Left kidney stone  . COPD (chronic obstructive pulmonary disease) ([place])   . Depression   . Diabetes mellitus without complication ([place])   . GERD (gastroesophageal reflux disease)   . Hyperlipemia   . Stomach ulcer   . Tobacco abuse disorder     Past Surgical History:  Procedure Laterality Date  . CYSTOSCOPY W/ URETERAL STENT PLACEMENT Left 12/15/2015   Procedure: CYSTOSCOPY WITH STENT REPLACEMENT;  Surgeon: Hollice Espy, MD;  Location: [facility] ORS;  Service: Urology;  Laterality: Left;  . CYSTOSCOPY WITH STENT PLACEMENT Left 11/16/2015   Procedure: CYSTOSCOPY WITH LEFT STENT PLACEMENT, LEFT RETROGRADE;  Surgeon: Dereck Leep, MD;  Location: [facility] ORS;  Service: Urology;  Laterality: Left;  . FOOT SURGERY Bilateral   . TUBAL LIGATION    . URETEROSCOPY WITH HOLMIUM LASER LITHOTRIPSY Left 12/15/2015   Procedure: URETEROSCOPY WITH HOLMIUM LASER LITHOTRIPSY;  Surgeon: Hollice Espy, MD;  Location: [facility] ORS;  Service: Urology;  Laterality: Left;    There were no vitals filed for this visit.      Subjective Assessment - 04/13/16 1401    Subjective Pt had to take pain meds for only 2 out of 7 days. She continues to  to not have any radiating pain down the leg for the past two weeks but experiences a "knot" of tight muscle in her back which her stretches have not been able to alleviate.  Pt has been informed by her MD that she can take the knee brace off and she can return to work but will have to wear the brace when she is at work.    Pertinent History 2 vaginal deliveries with stitches on both, tibal ligation,  URETEROSCOPY WITH HOLMIUM LASER LITHOTRIPSY and CYSTOSCOPY WITH STENT REPLACEMENT;  12/2015, IMPORTANT NOTES: After surgeries related kidney stone in April, pt noticed swelling in her R knee and diagnostic tests have ruled out DVT. and an MRI  showed hairline fracture (Dr. Sabra Heck). Pt was splinted a month ago, currently she wears a knee brace that restricts knee flexion 50 deg from extension. Dr. Sabra Heck plans to give her a cortisone shot.      Patient Stated Goals decrease pain, tolerable level 2-3/10             [facility] PT Assessment - 04/13/16 1426      Strength   Overall Strength Comments L hip abd 3/5 , R 3/5      Palpation   SI assessment  pinpointed mm tensions/ tenderness along S2-3 level by L PSIS, decreased post Tx    Palpation comment increased fascial restrictions over B LQ  Pelvic Floor Special Questions - 04/13/16 1502    Pelvic Floor Internal Exam pt consented verbally and without contradications    Exam Type Vaginal   Palpation minor tenderness at obt int on L, delayed lengthening of pelvic floor mm, cued for contraction showed accessory mm use . deferred pelvic floor strengthening. Bladder behind introitus and in a more cranial position with proper coordination training. No elevation of hips required for repositioning of bladder.            [place] Adult PT Treatment/Exercise - 04/13/16 1427      Manual Therapy   Manual therapy comments STM and MWM hip ext/ flex in R sidelying, fascial release over suprapubic area, internal thiele massage to  facilitate pelvic floor lengthening                  PT Education - 04/13/16 1503    Education provided Yes   Education Details HEP   Person(s) Educated Patient   Methods Explanation;Demonstration;Tactile cues;Verbal cues;Handout   Comprehension Returned demonstration;Verbalized understanding             PT Long Term Goals - 04/13/16 1406      PT LONG TERM GOAL #1   Title Pt will report decreased frequencies of intense pain episiodes of level 9/10 to once a month in order to improve sleep   Time 12   Period Weeks   Status Partially Met     PT LONG TERM GOAL #2   Title Pt will demo proper toileting mechanics to decrease straining of pelvic floor mm.    Time 12   Period Weeks   Status Achieved     PT LONG TERM GOAL #3   Title Pt will increase her gait speed from 0.58 m/s to >  1.2 m/s in order to ambulate safely when crossing the street.   Time 12   Period Weeks   Status On-going     PT LONG TERM GOAL #4   Title Pt will decrease her score on [place] 72% to < 50% in order to improve QOL   Time 12   Period Weeks   Status On-going     PT LONG TERM GOAL #5   Title Pt will decrease her score PFDI 75% to < 70% in order to demo improved pelvic floor function.   Time 12   Period Weeks   Status On-going               Plan - 04/13/16 1504    Clinical Impression Statement Pt reported the knot in her L low back was resolved after manual therapy. Pt showed improved pelvic floor coordination but required Tx for increased lengthening and diaphragmatic excursion. Pt showed more cranial position of bladder with proper pelvic floor coordination training. Educated pt on preventing increased load on pelvic floor by squeezing pelvic floor before coughing. Deferred pelvic floor strengthening due accessory mm use and delayed lengthening of mm. Plan to progress hip strengthening exercises.  Pt's MD has released pt to return to work and thus, plan to address R knee Sx at next  session to prepare pt for return to work.    Rehab Potential Good   Clinical Impairments Affecting Rehab Potential 2 vaginal deliveries with stitches on both, tibal ligation,  URETEROSCOPY WITH HOLMIUM LASER LITHOTRIPSY and CYSTOSCOPY WITH STENT REPLACEMENT;  MRI  showed hairline fracture (Dr. Sabra Heck). Pt was splinted a month ago, currently she wears a knee brace that restricts knee flexion 50 deg  from extension. Dr. Sabra Heck plans to give her a cortisone shot.      PT Frequency 1x / week   PT Duration 12 weeks   PT Treatment/Interventions ADLs/Self Care Home Management;Aquatic Therapy;Cryotherapy;Electrical Stimulation;Moist Heat;Traction;Gait training;Neuromuscular re-education;Balance training;Therapeutic exercise;Therapeutic activities;Functional mobility training;Stair training;Patient/family education;Orthotic Fit/Training;Manual techniques;Taping;Splinting;Energy conservation;Passive range of motion;Scar mobilization;Manual lymph drainage      Patient will benefit from skilled therapeutic intervention in order to improve the following deficits and impairments:  Abnormal gait, Pain, Improper body mechanics, Decreased coordination, Increased muscle spasms, Postural dysfunction, Decreased mobility, Decreased activity tolerance, Decreased strength, Decreased range of motion, Decreased endurance, Decreased balance, Decreased safety awareness, Difficulty walking, Impaired flexibility  Visit Diagnosis: Other abnormalities of gait and mobility  Midline low back pain, with sciatica presence unspecified  Pain in right knee  Muscle weakness (generalized)     Problem List Patient Active Problem List   Diagnosis Date Noted  . Cystocele, grade 2 02/10/2016  . Recurrent UTI 02/10/2016  . UTI (urinary tract infection), bacterial 01/20/2016  . Urinary frequency 01/20/2016  . Hydronephrosis with urinary obstruction due to ureteral calculus 12/01/2015  . Microscopic hematuria 12/01/2015  . Left  flank pain   . UTI (lower urinary tract infection)   . Left ureteral stone 11/16/2015    Jerl Mina ,PT, DPT, E-RYT  04/13/2016, 4:12 PM  [place] MAIN [facility] SERVICES [address] [place], [place], [address] Phone: [phone]   Fax:  [phone]  Name: Samantha Welch MRN: [id number] Date of Birth: 08-31-1951

## 2016-04-13 NOTE — Therapy (Signed)
[place] MAIN [facility] SERVICES [address] [place], [place], [address] Phone: [phone]   Fax:  [phone]  Physical Therapy Treatment  Patient Details  Name: Samantha Welch MRN: [id number] Date of Birth: 01/05/1951 Referring Provider: Ernestine Conrad   Encounter Date: 04/06/2016      PT End of Session - 04/13/16 1410    Visit Number 2   Number of Visits 12   Date for PT Re-Evaluation 06/30/16   Authorization Type g code 2/10   PT Start Time 8841   PT Stop Time 1625   PT Time Calculation (min) 55 min   Activity Tolerance Patient tolerated treatment well;No increased pain   Behavior During Therapy WFL for tasks assessed/performed      Past Medical History:  Diagnosis Date  . Anxiety   . Arthritis   . Chronic kidney disease    Left kidney stone  . COPD (chronic obstructive pulmonary disease) ([place])   . Depression   . Diabetes mellitus without complication ([place])   . GERD (gastroesophageal reflux disease)   . Hyperlipemia   . Stomach ulcer   . Tobacco abuse disorder     Past Surgical History:  Procedure Laterality Date  . CYSTOSCOPY W/ URETERAL STENT PLACEMENT Left 12/15/2015   Procedure: CYSTOSCOPY WITH STENT REPLACEMENT;  Surgeon: Hollice Espy, MD;  Location: [facility] ORS;  Service: Urology;  Laterality: Left;  . CYSTOSCOPY WITH STENT PLACEMENT Left 11/16/2015   Procedure: CYSTOSCOPY WITH LEFT STENT PLACEMENT, LEFT RETROGRADE;  Surgeon: Dereck Leep, MD;  Location: [facility] ORS;  Service: Urology;  Laterality: Left;  . FOOT SURGERY Bilateral   . TUBAL LIGATION    . URETEROSCOPY WITH HOLMIUM LASER LITHOTRIPSY Left 12/15/2015   Procedure: URETEROSCOPY WITH HOLMIUM LASER LITHOTRIPSY;  Surgeon: Hollice Espy, MD;  Location: [facility] ORS;  Service: Urology;  Laterality: Left;    There were no vitals filed for this visit.      Subjective Assessment - 04/13/16 1401    Subjective Pt had only had 2 out of 7 weeks when she had to take pain  meds. Otherwise, she continues to to not have any radiating pain down the leg for the past two weeks. Pt has been informed by her MD that she can take the knee brace off but will have to wear it at work.    Pertinent History 2 vaginal deliveries with stitches on both, tibal ligation,  URETEROSCOPY WITH HOLMIUM LASER LITHOTRIPSY and CYSTOSCOPY WITH STENT REPLACEMENT;  12/2015, IMPORTANT NOTES: After surgeries related kidney stone in April, pt noticed swelling in her R knee and diagnostic tests have ruled out DVT. and an MRI  showed hairline fracture (Dr. Sabra Heck). Pt was splinted a month ago, currently she wears a knee brace that restricts knee flexion 50 deg from extension. Dr. Sabra Heck plans to give her a cortisone shot.      Patient Stated Goals decrease pain, tolerable level 2-3/10             [facility] PT Assessment - 04/13/16 1409      Coordination   Gross Motor Movements are Fluid and Coordinated --  chest breathing initially,    Fine Motor Movements are Fluid and Coordinated --  abdominal straining w/ cue for bowel movement     Palpation   SI assessment  ASIS more symmetrical today, L PSUS with increased mm tensions, (post Tx, decreased )   Palpation comment  [place] Adult PT Treatment/Exercise - 04/13/16 1409      Neuro Re-ed    Neuro Re-ed Details  see pt instructions      Manual Therapy   Manual therapy comments STM release glut muscles, PA mob along L PSIS                        PT Long Term Goals - 04/13/16 1406      PT LONG TERM GOAL #1   Title Pt will report decreased frequencies of intense pain episiodes of level 9/10 to once a month in order to improve sleep   Time 12   Period Weeks   Status Partially Met     PT LONG TERM GOAL #2   Title Pt will demo proper toileting mechanics to decrease straining of pelvic floor mm.    Time 12   Period Weeks   Status Achieved     PT LONG TERM GOAL #3   Title Pt will increase her  gait speed from 0.58 m/s to >  1.2 m/s in order to ambulate safely when crossing the street.   Time 12   Period Weeks   Status On-going     PT LONG TERM GOAL #4   Title Pt will decrease her score on [place] 72% to < 50% in order to improve QOL   Time 12   Period Weeks   Status On-going     PT LONG TERM GOAL #5   Title Pt will decrease her score PFDI 75% to < 70% in order to demo improved pelvic floor function.   Time 12   Period Weeks   Status On-going               Plan - 04/13/16 1411    Clinical Impression Statement Pt showed more pelvic symmetry and decreased pelvic floor mm tensions post-Tx. Suspect pt's knee injury and brace wearing were contributing factors to her pain. Pt is progressing well towards her goals.    Rehab Potential Good   PT Frequency 1x / week   PT Duration 12 weeks   PT Treatment/Interventions ADLs/Self Care Home Management;Aquatic Therapy;Cryotherapy;Electrical Stimulation;Moist Heat;Traction;Gait training;Neuromuscular re-education;Balance training;Therapeutic exercise;Therapeutic activities;Functional mobility training;Stair training;Patient/family education;Orthotic Fit/Training;Manual techniques;Taping;Splinting;Energy conservation;Passive range of motion;Scar mobilization;Manual lymph drainage   Consulted and Agree with Plan of Care Patient      Patient will benefit from skilled therapeutic intervention in order to improve the following deficits and impairments:  Abnormal gait, Pain, Improper body mechanics, Decreased coordination, Increased muscle spasms, Postural dysfunction, Decreased mobility, Decreased activity tolerance, Decreased strength, Decreased range of motion, Decreased endurance, Decreased balance, Decreased safety awareness, Difficulty walking, Impaired flexibility  Visit Diagnosis: Other abnormalities of gait and mobility  Muscle weakness (generalized)  Midline low back pain, with sciatica presence unspecified  Pain in right  knee     Problem List Patient Active Problem List   Diagnosis Date Noted  . Cystocele, grade 2 02/10/2016  . Recurrent UTI 02/10/2016  . UTI (urinary tract infection), bacterial 01/20/2016  . Urinary frequency 01/20/2016  . Hydronephrosis with urinary obstruction due to ureteral calculus 12/01/2015  . Microscopic hematuria 12/01/2015  . Left flank pain   . UTI (lower urinary tract infection)   . Left ureteral stone 11/16/2015    Jerl Mina 04/13/2016, 2:12 PM  [place] MAIN [facility] SERVICES [address] [place], [place], [address] Phone: [phone]   Fax:  [phone]  Name: Lynsey Ange  [place] MRN: [id number] Date of Birth: November 09, 1950

## 2016-04-20 ENCOUNTER — Encounter: Payer: Self-pay | Admitting: Physical Therapy

## 2016-04-20 ENCOUNTER — Ambulatory Visit: Payer: Medicare Other | Admitting: Physical Therapy

## 2016-04-20 VITALS — BP 143/58

## 2016-04-20 DIAGNOSIS — R2689 Other abnormalities of gait and mobility: Secondary | ICD-10-CM | POA: Diagnosis not present

## 2016-04-20 DIAGNOSIS — M6281 Muscle weakness (generalized): Secondary | ICD-10-CM

## 2016-04-20 DIAGNOSIS — M25561 Pain in right knee: Secondary | ICD-10-CM

## 2016-04-20 DIAGNOSIS — M545 Low back pain: Secondary | ICD-10-CM

## 2016-04-20 NOTE — Therapy (Signed)
[place] MAIN [facility] SERVICES [address] [place], [place], [address] Phone: [phone]   Fax:  [phone]  Physical Therapy Treatment  Patient Details  Name: Samantha Welch MRN: [id number] Date of Birth: 13-Mar-1951 Referring Provider: Ernestine Conrad   Encounter Date: 04/20/2016      PT End of Session - 04/20/16 1508    Visit Number 4   Number of Visits 12   Authorization Type 4/10   PT Start Time [id number]   PT Stop Time 1515   PT Time Calculation (min) 50 min   Activity Tolerance Patient tolerated treatment well;No increased pain   Behavior During Therapy WFL for tasks assessed/performed      Past Medical History:  Diagnosis Date  . Anxiety   . Arthritis   . Chronic kidney disease    Left kidney stone  . COPD (chronic obstructive pulmonary disease) ([place])   . Depression   . Diabetes mellitus without complication ([place])   . GERD (gastroesophageal reflux disease)   . Hyperlipemia   . Stomach ulcer   . Tobacco abuse disorder     Past Surgical History:  Procedure Laterality Date  . CYSTOSCOPY W/ URETERAL STENT PLACEMENT Left 12/15/2015   Procedure: CYSTOSCOPY WITH STENT REPLACEMENT;  Surgeon: Hollice Espy, MD;  Location: [facility];  Service: Urology;  Laterality: Left;  . CYSTOSCOPY WITH STENT PLACEMENT Left 11/16/2015   Procedure: CYSTOSCOPY WITH LEFT STENT PLACEMENT, LEFT RETROGRADE;  Surgeon: Dereck Leep, MD;  Location: [facility];  Service: Urology;  Laterality: Left;  . FOOT SURGERY Bilateral   . TUBAL LIGATION    . URETEROSCOPY WITH HOLMIUM LASER LITHOTRIPSY Left 12/15/2015   Procedure: URETEROSCOPY WITH HOLMIUM LASER LITHOTRIPSY;  Surgeon: Hollice Espy, MD;  Location: [facility];  Service: Urology;  Laterality: Left;    Vitals:   04/20/16 1515 04/20/16 1526  BP: (!) 153/63 (!) 143/58        Subjective Assessment - 04/20/16 1430    Subjective Pt went back to work wearing her knee brace. Pt only felt shoulder tensions.   Pt feels her urinary Sx have improved , she no longer has pain with urination and her LBP only been sore.    Pertinent History 2 vaginal deliveries with stitches on both, tibal ligation,  URETEROSCOPY WITH HOLMIUM LASER LITHOTRIPSY and CYSTOSCOPY WITH STENT REPLACEMENT;  12/2015, IMPORTANT NOTES: After surgeries related kidney stone in April, pt noticed swelling in her R knee and diagnostic tests have ruled out DVT. and an MRI  showed hairline fracture (Dr. Sabra Heck). Pt was splinted a month ago, currently she wears a knee brace that restricts knee flexion 50 deg from extension. Dr. Sabra Heck plans to give her a cortisone shot.      Patient Stated Goals decrease pain, tolerable level 2-3/10             [facility] PT Assessment - 04/20/16 1438      Coordination   Fine Motor Movements are Fluid and Coordinated --  improved lengthening of pelvic floor (external palpation)     Palpation   Palpation comment no tenderness over palpation                      [facility] Adult PT Treatment/Exercise - 04/20/16 1444      Therapeutic Activites    Therapeutic Activities --  see pt instructions     Neuro Re-ed    Neuro Re-ed Details  see pt instructions  PT Education - 04/20/16 1451    Education provided Yes   Education Details HEP, reinforced water intake for minimize kidney stones, and regular monitoring of BP   Person(s) Educated Patient   Methods Explanation;Demonstration;Tactile cues;Verbal cues;Handout   Comprehension Returned demonstration;Verbalized understanding             PT Long Term Goals - 04/20/16 1431      PT LONG TERM GOAL #1   Title Pt will report decreased frequencies of intense pain episiodes of level 9/10 to once a month in order to improve sleep   Time 12   Period Weeks   Status Achieved     PT LONG TERM GOAL #2   Title Pt will demo proper toileting mechanics to decrease straining of pelvic floor mm.    Time 12   Period Weeks   Status  Achieved     PT LONG TERM GOAL #3   Title Pt will increase her gait speed from 0.58 m/s to >  1.2 m/s in order to ambulate safely when crossing the street.   Time 12   Period Weeks   Status On-going     PT LONG TERM GOAL #4   Title Pt will decrease her score on [place] 72% to < 50% in order to improve QOL   Time 12   Period Weeks   Status On-going     PT LONG TERM GOAL #5   Title Pt will decrease her score PFDI 75% to < 70% in order to demo improved pelvic floor function.   Time 12   Period Weeks   Status On-going               Plan - 04/20/16 1452    Clinical Impression Statement Pt is progressing well with no more pain with urination, no tenderness with palpation over pubic bone.  Pt is advancing towards her goals. Initated deep core strenghtening exercises to help her build up her endurance with standing on the job. Pt will continue benefit from skilled PT. Pt arrived to appt reporting a headache and sinus issues. Pt reported light headedness following exercises. BP was 153/53 but later dropped to 143/53 after pt drank a cup of water. Pt stated her latter BP was her norm. Plan to continue to monitor her BP and water intake at next session.    Rehab Potential Good   Clinical Impairments Affecting Rehab Potential 2 vaginal deliveries with stitches on both, tibal ligation,  URETEROSCOPY WITH HOLMIUM LASER LITHOTRIPSY and CYSTOSCOPY WITH STENT REPLACEMENT;  MRI  showed hairline fracture (Dr. Sabra Heck). Pt was splinted a month ago, currently she wears a knee brace that restricts knee flexion 50 deg from extension. Dr. Sabra Heck plans to give her a cortisone shot.      PT Frequency 1x / week   PT Duration 12 weeks   PT Treatment/Interventions ADLs/Self Care Home Management;Aquatic Therapy;Cryotherapy;Electrical Stimulation;Moist Heat;Traction;Gait training;Neuromuscular re-education;Balance training;Therapeutic exercise;Therapeutic activities;Functional mobility training;Stair  training;Patient/family education;Orthotic Fit/Training;Manual techniques;Taping;Splinting;Energy conservation;Passive range of motion;Scar mobilization;Manual lymph drainage      Patient will benefit from skilled therapeutic intervention in order to improve the following deficits and impairments:  Abnormal gait, Pain, Improper body mechanics, Decreased coordination, Increased muscle spasms, Postural dysfunction, Decreased mobility, Decreased activity tolerance, Decreased strength, Decreased range of motion, Decreased endurance, Decreased balance, Decreased safety awareness, Difficulty walking, Impaired flexibility  Visit Diagnosis: Other abnormalities of gait and mobility  Midline low back pain, with sciatica presence unspecified  Pain in right knee  Muscle  weakness (generalized)     Problem List Patient Active Problem List   Diagnosis Date Noted  . Cystocele, grade 2 02/10/2016  . Recurrent UTI 02/10/2016  . UTI (urinary tract infection), bacterial 01/20/2016  . Urinary frequency 01/20/2016  . Hydronephrosis with urinary obstruction due to ureteral calculus 12/01/2015  . Microscopic hematuria 12/01/2015  . Left flank pain   . UTI (lower urinary tract infection)   . Left ureteral stone 11/16/2015    Jerl Mina ,PT, DPT, E-RYT  04/20/2016, 3:31 PM  [place] MAIN [facility] SERVICES [address] [place], [place], [address] Phone: [phone]   Fax:  [phone]  Name: Samantha Welch MRN: [id number] Date of Birth: Jan 19, 1951

## 2016-04-20 NOTE — Patient Instructions (Addendum)
1a. After work, elevate your feet onto pillows, couch arm, wall, pump your ankles  10 min  1b. 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   2.  [place] 45 Degrees   Lying with hips and knees bent 45, one pillow between knees and ankles. Lift knee with exhale. Be sure pelvis does not roll backward. Do not arch back. Do 10 times x 2 , each leg, 2 times per day.  http://ss.exer.us/75   Copyright  VHI. All rights reserved.    Figure stretch with leg extended 5 breaths    3. You are now ready to begin training the deep core muscles system: diaphragm, transverse abdominis, pelvic floor . These muscles must work together as a team.       The key to these exercises to train the brain to coordinate the timing of these muscles and to have them turn on for long periods of time to hold you upright against gravity (especially important if you are on your feet all day).These muscles are postural muscles and play a role stabilizing your spine and bodyweight. By doing these repetitions slowly and correctly instead of doing crunches, you will achieve a flatter belly without a lower pooch. You are also placing your spine in a more neutral position and breathing properly which in turn, decreases your risk for problems related to your pelvic floor, abdominal, and low back such as pelvic organ prolapse, hernias, diastasis recti (separation of superficial muscles), disk herniations, spinal fractures. These exercises set a solid foundation for you to later progress to resistance/ strength training with therabands and weights and return to other typical fitness exercises with a stronger deeper core.  Do only level 2 (30 reps x 2 x day)

## 2016-05-11 ENCOUNTER — Ambulatory Visit: Payer: Medicare Other | Admitting: Physical Therapy

## 2016-05-21 ENCOUNTER — Ambulatory Visit: Payer: Medicare Other | Attending: Urology | Admitting: Physical Therapy

## 2016-05-21 DIAGNOSIS — R2689 Other abnormalities of gait and mobility: Secondary | ICD-10-CM | POA: Insufficient documentation

## 2016-05-21 DIAGNOSIS — M545 Low back pain: Secondary | ICD-10-CM | POA: Diagnosis present

## 2016-05-21 DIAGNOSIS — M6281 Muscle weakness (generalized): Secondary | ICD-10-CM | POA: Diagnosis present

## 2016-05-21 DIAGNOSIS — M25561 Pain in right knee: Secondary | ICD-10-CM | POA: Diagnosis present

## 2016-05-21 NOTE — Therapy (Addendum)
[place] MAIN [facility] SERVICES [address] [place], [place], [address] Phone: [phone]   Fax:  [phone]  Physical Therapy Treatment / Progress Note  Patient Details  Name: Samantha Welch MRN: [id number] Date of Birth: 07/14/1951 Referring Provider: Ernestine Conrad   Encounter Date: 05/21/2016      PT End of Session - 05/21/16 2326    Visit Number 5   Number of Visits 12   Date for PT Re-Evaluation 06/30/16   Authorization Type 5/10   PT Start Time [id number]   PT Stop Time 1500   PT Time Calculation (min) 57 min   Activity Tolerance Patient tolerated treatment well;No increased pain   Behavior During Therapy WFL for tasks assessed/performed      Past Medical History:  Diagnosis Date  . Anxiety   . Arthritis   . Chronic kidney disease    Left kidney stone  . COPD (chronic obstructive pulmonary disease) ([place])   . Depression   . Diabetes mellitus without complication ([place])   . GERD (gastroesophageal reflux disease)   . Hyperlipemia   . Stomach ulcer   . Tobacco abuse disorder     Past Surgical History:  Procedure Laterality Date  . CYSTOSCOPY W/ URETERAL STENT PLACEMENT Left 12/15/2015   Procedure: CYSTOSCOPY WITH STENT REPLACEMENT;  Surgeon: Hollice Espy, MD;  Location: [facility];  Service: Urology;  Laterality: Left;  . CYSTOSCOPY WITH STENT PLACEMENT Left 11/16/2015   Procedure: CYSTOSCOPY WITH LEFT STENT PLACEMENT, LEFT RETROGRADE;  Surgeon: Dereck Leep, MD;  Location: [facility];  Service: Urology;  Laterality: Left;  . FOOT SURGERY Bilateral   . TUBAL LIGATION    . URETEROSCOPY WITH HOLMIUM LASER LITHOTRIPSY Left 12/15/2015   Procedure: URETEROSCOPY WITH HOLMIUM LASER LITHOTRIPSY;  Surgeon: Hollice Espy, MD;  Location: [facility];  Service: Urology;  Laterality: Left;    There were no vitals filed for this visit.                    Pelvic Floor Special Questions - 05/21/16 2325    Pelvic Floor Internal Exam  pt consented verbally and without contradications    Exam Type Vaginal   Palpation no tenderness but slight mm tensions upon insertion of finger and required cuing for optimal lengthening of pelvic floor mm           [facility] Adult PT Treatment/Exercise - 05/21/16 2322      Therapeutic Activites    Therapeutic Activities --  reassessed goals and address functional applications of pelv                PT Education - 05/21/16 2325    Education provided Yes   Education Details HEP, goals review   Person(s) Educated Patient   Methods Explanation;Demonstration;Tactile cues;Verbal cues   Comprehension Returned demonstration;Verbalized understanding             PT Long Term Goals - 05/21/16 1409      PT LONG TERM GOAL #1   Title Pt will report decreased frequencies of intense pain episiodes of level 9/10 to once a month in order to improve sleep   Time 12   Period Weeks   Status Achieved     PT LONG TERM GOAL #2   Title Pt will demo proper toileting mechanics to decrease straining of pelvic floor mm.    Time 12   Period Weeks   Status Achieved     PT LONG TERM GOAL #3  Title Pt will increase her gait speed from 0.58 m/s to >  1.2 m/s in order to ambulate safely when crossing the street.   Time 12   Period Weeks   Status On-going     PT LONG TERM GOAL #4   Title Pt will decrease her score on [place] 72% to < 50% in order to improve QOL (05/21/16: 15%)     Time 12   Period Weeks   Status Achieved     PT LONG TERM GOAL #5   Title Pt will decrease her score PFDI 75% to < 70% in order to demo improved pelvic floor function. (05/21/16: 50%)    Time 12   Period Weeks   Status Achieved     Additional Long Term Goals   Additional Long Term Goals Yes     PT LONG TERM GOAL #6   Title Pt will demo proper lengthening of pelvic floor mm with internal assessment without cuing in order to minimize relapse of Sx    Time 12   Period Weeks   Status New                Plan - 05/21/16 1423    Clinical Impression Statement Across the past 5 visits, pt has acheived 4/6 of her goals. She no longer has pelvic pain for the past month and therefore, has had improved sleep and no pain with urination.  Her score on [place] decreased from 75% to 15% and her PFDI score decreased from 75% to 50%. Pt is close to achieving her remaining goals.     Rehab Potential Good   Clinical Impairments Affecting Rehab Potential 2 vaginal deliveries with stitches on both, tibal ligation,  URETEROSCOPY WITH HOLMIUM LASER LITHOTRIPSY and CYSTOSCOPY WITH STENT REPLACEMENT;  MRI  showed hairline fracture (Dr. Sabra Heck). Pt was splinted a month ago, currently she wears a knee brace that restricts knee flexion 50 deg from extension. Dr. Sabra Heck plans to give her a cortisone shot.      PT Frequency 1x / week   PT Duration 12 weeks   PT Treatment/Interventions ADLs/Self Care Home Management;Aquatic Therapy;Cryotherapy;Electrical Stimulation;Moist Heat;Traction;Gait training;Neuromuscular re-education;Balance training;Therapeutic exercise;Therapeutic activities;Functional mobility training;Stair training;Patient/family education;Orthotic Fit/Training;Manual techniques;Taping;Splinting;Energy conservation;Passive range of motion;Scar mobilization;Manual lymph drainage      Patient will benefit from skilled therapeutic intervention in order to improve the following deficits and impairments:  Abnormal gait, Pain, Improper body mechanics, Decreased coordination, Increased muscle spasms, Postural dysfunction, Decreased mobility, Decreased activity tolerance, Decreased strength, Decreased range of motion, Decreased endurance, Decreased balance, Decreased safety awareness, Difficulty walking, Impaired flexibility  Visit Diagnosis: Other abnormalities of gait and mobility  Midline low back pain, with sciatica presence unspecified  Pain in right knee  Muscle weakness (generalized)     Problem  List Patient Active Problem List   Diagnosis Date Noted  . Cystocele, grade 2 02/10/2016  . Recurrent UTI 02/10/2016  . UTI (urinary tract infection), bacterial 01/20/2016  . Urinary frequency 01/20/2016  . Hydronephrosis with urinary obstruction due to ureteral calculus 12/01/2015  . Microscopic hematuria 12/01/2015  . Left flank pain   . UTI (lower urinary tract infection)   . Left ureteral stone 11/16/2015    Jerl Mina ,PT, DPT, E-RYT  05/21/2016, 11:30 PM  [place] MAIN [facility] SERVICES [address] [place], [place], [address] Phone: [phone]   Fax:  [phone]  Name: Samantha Welch MRN: [id number] Date of Birth: July 11, 1951

## 2016-05-27 ENCOUNTER — Ambulatory Visit: Payer: Medicare Other | Admitting: Physical Therapy

## 2016-05-31 ENCOUNTER — Ambulatory Visit: Payer: Medicare Other | Admitting: Physical Therapy

## 2016-06-07 ENCOUNTER — Ambulatory Visit: Payer: Medicare Other | Admitting: Physical Therapy

## 2016-07-07 ENCOUNTER — Other Ambulatory Visit: Payer: Self-pay | Admitting: Gastroenterology

## 2016-07-07 DIAGNOSIS — R1013 Epigastric pain: Secondary | ICD-10-CM

## 2016-07-09 ENCOUNTER — Ambulatory Visit: Payer: Medicare Other

## 2016-07-14 ENCOUNTER — Ambulatory Visit
Admission: RE | Admit: 2016-07-14 | Discharge: 2016-07-14 | Disposition: A | Discharge status: Home / Self Care | Payer: Medicare Other | Source: Ambulatory Visit | Attending: Gastroenterology | Admitting: Gastroenterology

## 2016-07-14 DIAGNOSIS — R1013 Epigastric pain: Secondary | ICD-10-CM | POA: Diagnosis present

## 2016-07-14 DIAGNOSIS — K219 Gastro-esophageal reflux disease without esophagitis: Secondary | ICD-10-CM | POA: Insufficient documentation

## 2016-07-14 DIAGNOSIS — K449 Diaphragmatic hernia without obstruction or gangrene: Secondary | ICD-10-CM | POA: Insufficient documentation

## 2016-07-14 DIAGNOSIS — K5710 Diverticulosis of small intestine without perforation or abscess without bleeding: Secondary | ICD-10-CM | POA: Insufficient documentation

## 2016-07-14 DIAGNOSIS — K228 Other specified diseases of esophagus: Secondary | ICD-10-CM | POA: Diagnosis not present

## 2016-12-22 ENCOUNTER — Other Ambulatory Visit: Payer: Self-pay | Admitting: Family

## 2016-12-22 DIAGNOSIS — Z1239 Encounter for other screening for malignant neoplasm of breast: Secondary | ICD-10-CM

## 2017-03-20 IMAGING — CT CT RENAL STONE PROTOCOL
1 of 2 series · 15 of 32 positions shown, 19 images · non-contrast
Comparison: None.

CLINICAL DATA: Awoke with left flank pain.

EXAM:
CT ABDOMEN AND PELVIS WITHOUT CONTRAST
TECHNIQUE: Multidetector CT imaging of the abdomen and pelvis was performed
following the standard protocol without IV contrast.

[Series 2: stone standard full · axial · 0.65mm/px · z∈[-346,+74]mm · 15 of 92 slices shown, 19 images]
[im 4/92  soft-tissue]
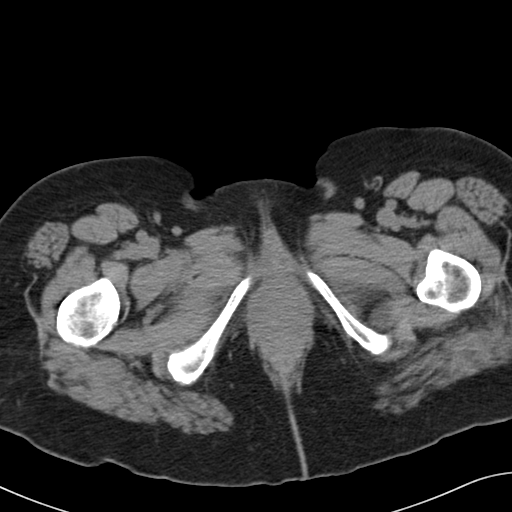
[im 4/92  bone]
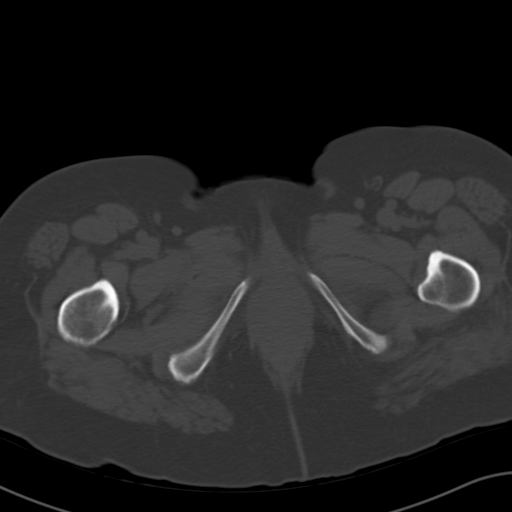
[im 12/92  soft-tissue]
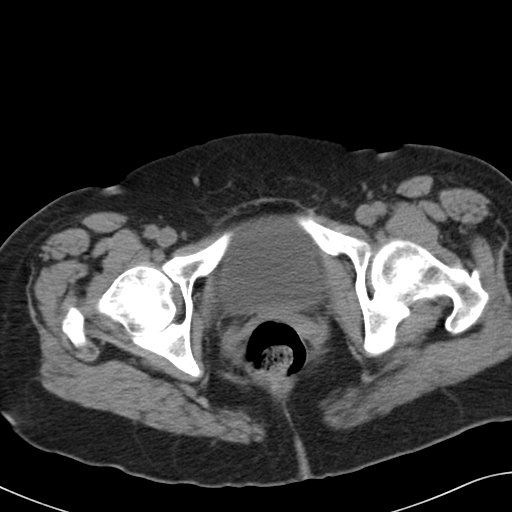
[im 19/92  soft-tissue]
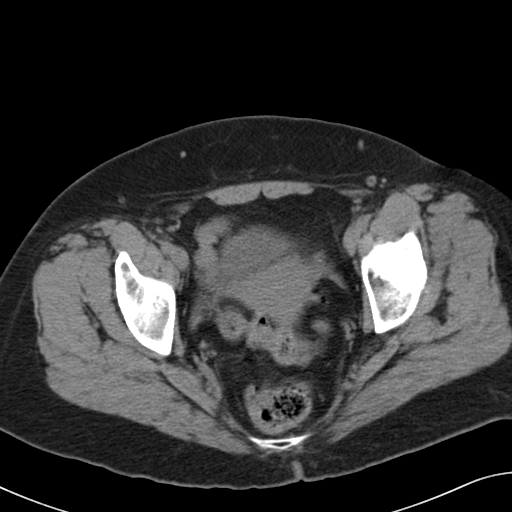
[im 27/92  soft-tissue]
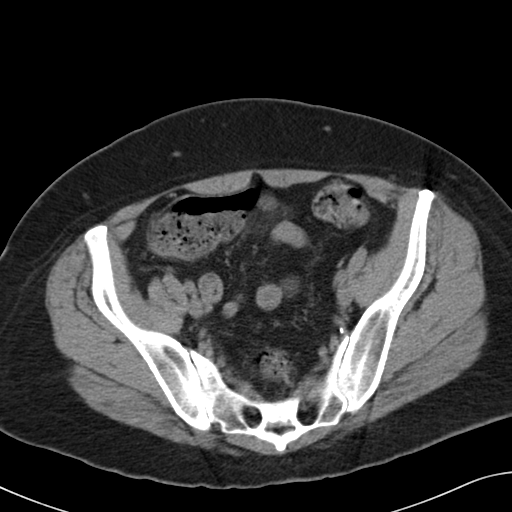
[im 31/92  soft-tissue]
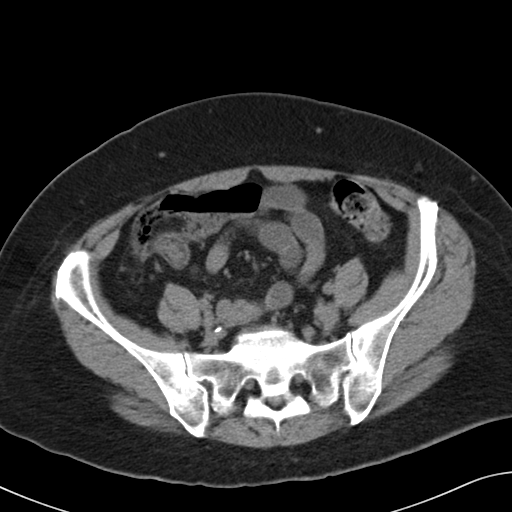
[im 38/92  soft-tissue]
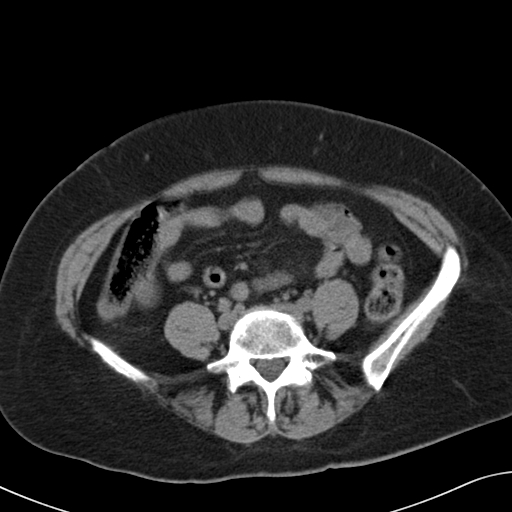
[im 46/92  soft-tissue]
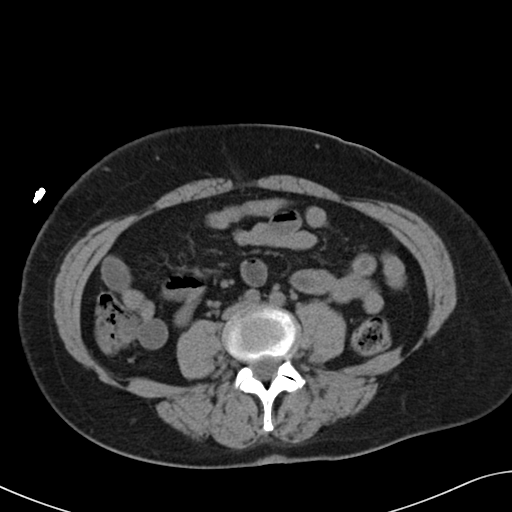
[im 54/92  soft-tissue]
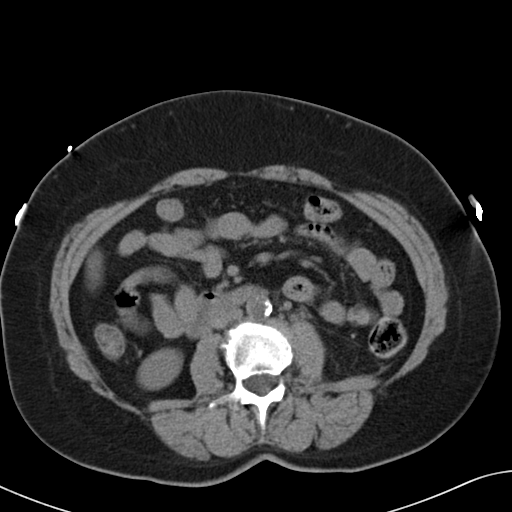
[im 61/92  soft-tissue]
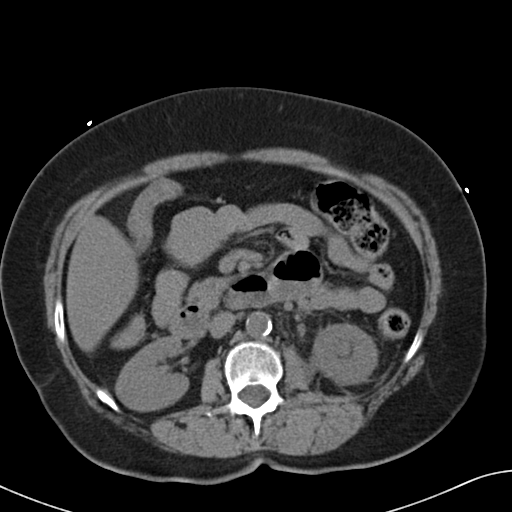
[im 61/92  bone]
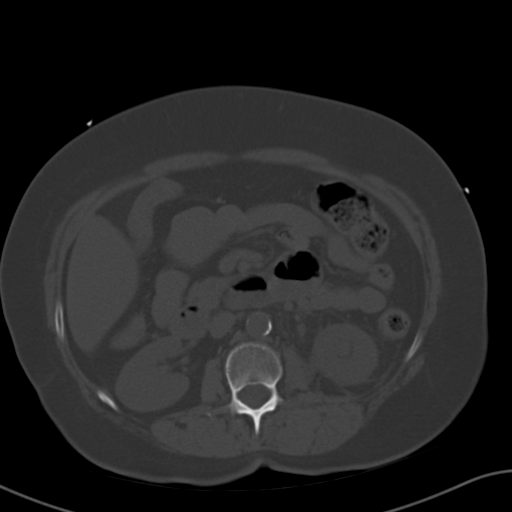
[im 65/92  soft-tissue]
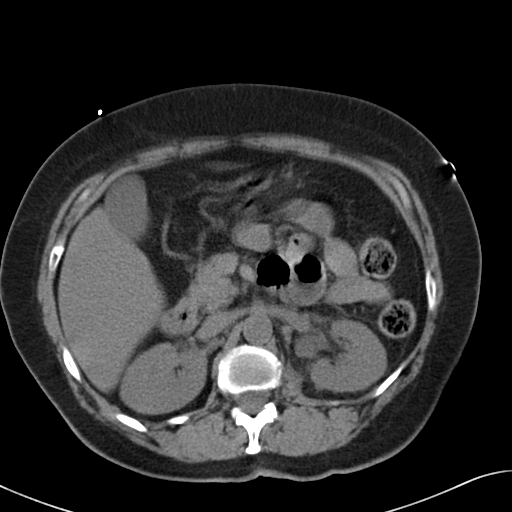
[im 73/92  soft-tissue]
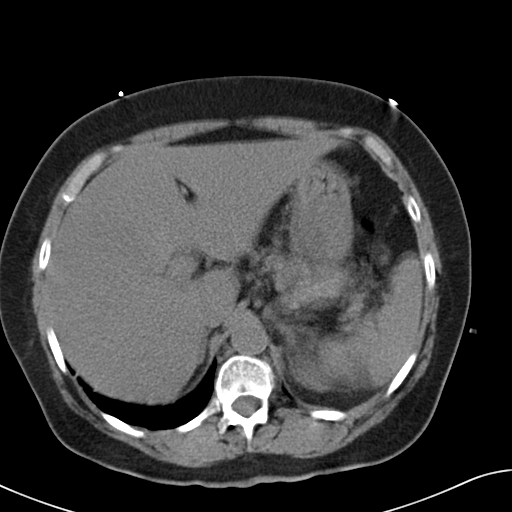
[im 76/92  lung]
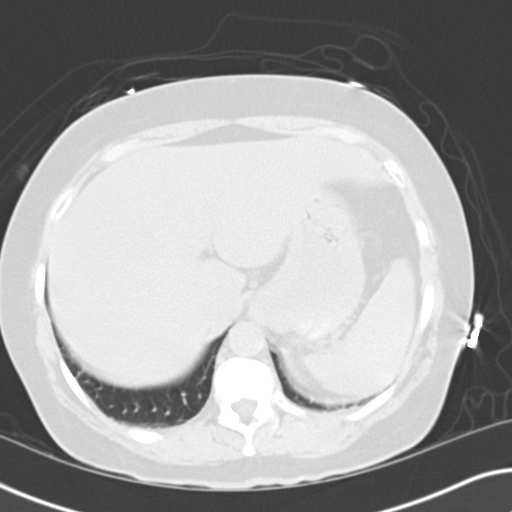
[im 80/92  soft-tissue]
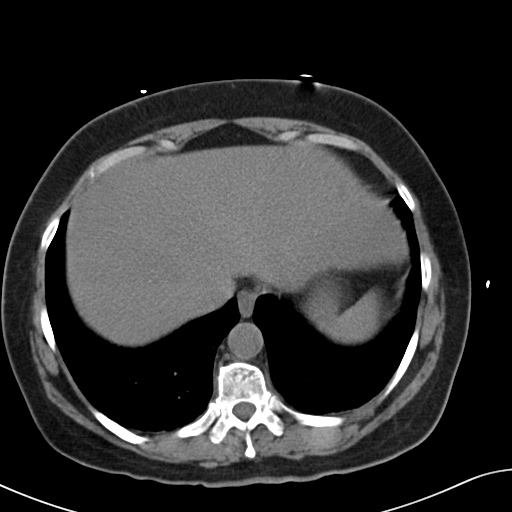
[im 80/92  lung]
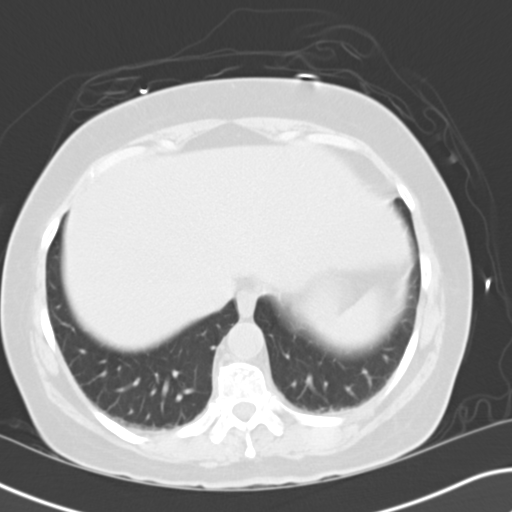
[im 84/92  lung]
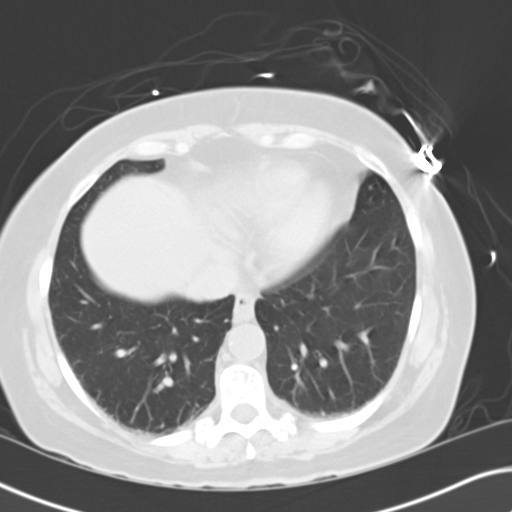
[im 88/92  soft-tissue]
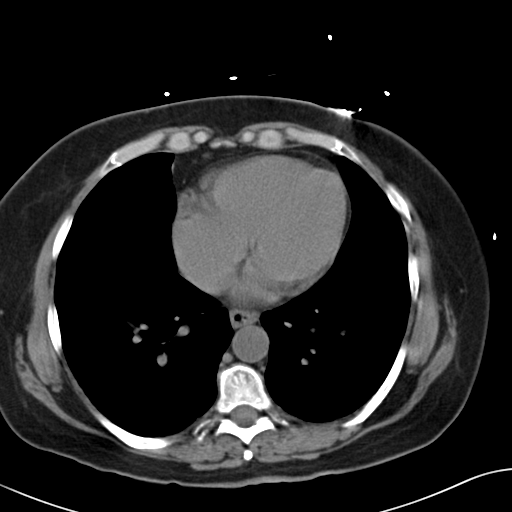
[im 88/92  lung]
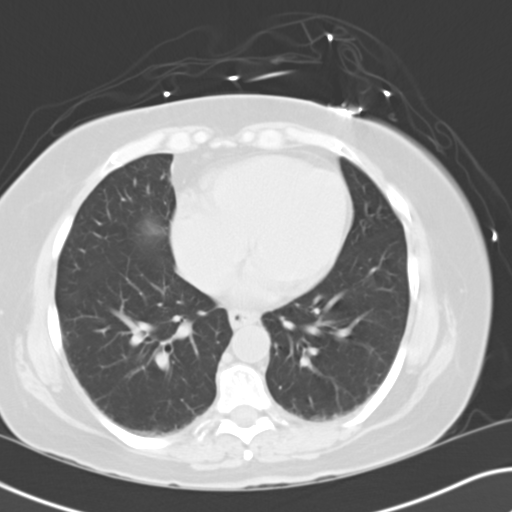

[15 of 32 positions shown; findings below may reference images not displayed]

FINDINGS: Lower chest: Minimal dependent atelectasis at the lung bases. Heart
is normal in size. No pleural effusion.

Liver: Mild diffusely decreased density consistent with steatosis.
No evidence of focal lesion allowing for lack contrast.

Hepatobiliary: Gallbladder physiologically distended, no calcified
stone. No biliary dilatation.

Pancreas: No ductal dilatation or inflammation. Mild motion artifact
at this level.

Spleen: Normal, allowing for mild motion artifact.

Adrenal glands: No nodule.

Kidneys: Obstructing 3 x 6 mm stone in the left proximal ureter (at
the level of L2) with proximal hydroureteronephrosis and perinephric
stranding. Ureter distal to this is decompressed. No additional
nonobstructing stones in either kidney. No right hydronephrosis.

Stomach/Bowel: Limited assessment secondary to lack of enteric
contrast and mild motion artifact. Stomach physiologically distended
with ingested contents. There are no dilated or thickened small
bowel loops. Small volume of stool throughout the colon without
colonic wall thickening. The appendix is tentatively identified, no
periappendiceal or pericecal inflammatory change.

Vascular/Lymphatic: No retroperitoneal adenopathy. Abdominal aorta
is normal in caliber. Mild to moderate atherosclerosis without
aneurysm.

Reproductive: Uterus remains in situ.  No adnexal mass.

Bladder: Physiologically distended, no wall thickening. No bladder
stone.

Other: No free air, free fluid, or intra-abdominal fluid collection.

Musculoskeletal: There are no acute or suspicious osseous
abnormalities.
IMPRESSION: Obstructing 3 x 6 mm stone in the left proximal ureter with proximal
hydroureteronephrosis.

## 2017-03-22 ENCOUNTER — Ambulatory Visit
Admission: RE | Admit: 2017-03-22 | Discharge: 2017-03-22 | Disposition: A | Discharge status: Home / Self Care | Payer: Medicare Other | Source: Ambulatory Visit | Attending: Family | Admitting: Family

## 2017-03-22 ENCOUNTER — Encounter (INDEPENDENT_AMBULATORY_CARE_PROVIDER_SITE_OTHER): Payer: Self-pay

## 2017-03-22 DIAGNOSIS — Z1239 Encounter for other screening for malignant neoplasm of breast: Secondary | ICD-10-CM

## 2017-03-22 DIAGNOSIS — Z1231 Encounter for screening mammogram for malignant neoplasm of breast: Secondary | ICD-10-CM | POA: Diagnosis not present

## 2017-03-22 HISTORY — DX: Malignant (primary) neoplasm, unspecified: C80.1

## 2017-10-26 ENCOUNTER — Ambulatory Visit
Admission: EM | Admit: 2017-10-26 | Discharge: 2017-10-26 | Disposition: A | Discharge status: Home / Self Care | Payer: Medicare Other | Attending: Family Medicine | Admitting: Family Medicine

## 2017-10-26 ENCOUNTER — Encounter: Payer: Self-pay | Admitting: Emergency Medicine

## 2017-10-26 ENCOUNTER — Other Ambulatory Visit: Payer: Self-pay

## 2017-10-26 DIAGNOSIS — Z87440 Personal history of urinary (tract) infections: Secondary | ICD-10-CM | POA: Diagnosis present

## 2017-10-26 DIAGNOSIS — F419 Anxiety disorder, unspecified: Secondary | ICD-10-CM | POA: Insufficient documentation

## 2017-10-26 DIAGNOSIS — R5383 Other fatigue: Secondary | ICD-10-CM | POA: Insufficient documentation

## 2017-10-26 DIAGNOSIS — R1010 Upper abdominal pain, unspecified: Secondary | ICD-10-CM | POA: Diagnosis not present

## 2017-10-26 DIAGNOSIS — K219 Gastro-esophageal reflux disease without esophagitis: Secondary | ICD-10-CM | POA: Diagnosis not present

## 2017-10-26 DIAGNOSIS — Z8719 Personal history of other diseases of the digestive system: Secondary | ICD-10-CM | POA: Diagnosis not present

## 2017-10-26 DIAGNOSIS — N189 Chronic kidney disease, unspecified: Secondary | ICD-10-CM | POA: Diagnosis not present

## 2017-10-26 DIAGNOSIS — E785 Hyperlipidemia, unspecified: Secondary | ICD-10-CM | POA: Insufficient documentation

## 2017-10-26 DIAGNOSIS — Z87442 Personal history of urinary calculi: Secondary | ICD-10-CM | POA: Diagnosis not present

## 2017-10-26 DIAGNOSIS — R300 Dysuria: Secondary | ICD-10-CM | POA: Diagnosis not present

## 2017-10-26 DIAGNOSIS — N390 Urinary tract infection, site not specified: Secondary | ICD-10-CM

## 2017-10-26 DIAGNOSIS — E1122 Type 2 diabetes mellitus with diabetic chronic kidney disease: Secondary | ICD-10-CM | POA: Diagnosis not present

## 2017-10-26 DIAGNOSIS — F329 Major depressive disorder, single episode, unspecified: Secondary | ICD-10-CM | POA: Diagnosis not present

## 2017-10-26 DIAGNOSIS — J449 Chronic obstructive pulmonary disease, unspecified: Secondary | ICD-10-CM | POA: Diagnosis not present

## 2017-10-26 DIAGNOSIS — Z87891 Personal history of nicotine dependence: Secondary | ICD-10-CM | POA: Insufficient documentation

## 2017-10-26 LAB — URINALYSIS, COMPLETE (UACMP) WITH MICROSCOPIC
Bilirubin Urine: NEGATIVE
Hgb urine dipstick: NEGATIVE
Ketones, ur: NEGATIVE mg/dL
Nitrite: POSITIVE — AB
PH: 5.5 (ref 5.0–8.0)
Protein, ur: NEGATIVE mg/dL
RBC / HPF: NONE SEEN RBC/hpf (ref 0–5)
SPECIFIC GRAVITY, URINE: 1.02 (ref 1.005–1.030)
Squamous Epithelial / LPF: NONE SEEN

## 2017-10-26 LAB — GLUCOSE, CAPILLARY: Glucose-Capillary: 161 mg/dL — ABNORMAL HIGH (ref 65–99)

## 2017-10-26 MED ORDER — CEPHALEXIN 500 MG PO CAPS: 500.0000 mg | ORAL_CAPSULE | Freq: Two times a day (BID) | ORAL | 0 refills | Status: AC

## 2017-10-26 NOTE — Discharge Instructions (Signed)
Take medication as prescribed. Rest. Drink plenty of fluids.  ° °Follow up with your primary care physician this week as needed. Return to Urgent care for new or worsening concerns.  ° °

## 2017-10-26 NOTE — ED Triage Notes (Signed)
Patient c/o fatigue and chills that started on Friday.  Patient reports some burning when urinating that started yesterday.

## 2017-10-26 NOTE — ED Provider Notes (Signed)
[facility] ____________________________________________  Time seen: Approximately 11:31 AM  I have reviewed the triage vital signs and the nursing notes.   HISTORY  Chief Complaint Fatigue and Dysuria   HPI Samantha Welch is a 67 y.o. female presented for evaluation of urinary discomfort that has been present for close to 1 week.  States at first she thought symptoms were coming and going, but reports symptoms have been persistent last few days of feeling like she needs to urinate more frequently and some burning discomfort as well as pressure with active urination.  Denies any vaginal bleeding or discharge.States has felt tired over last few days.  States she has chronic left upper abdominal pain, and has recently had some suprapubic pressure with current symptoms.  Denies any change in chronic abdominal pain.  States some intermittent nausea, no vomiting.  No accompanying fevers, atypical back pain, flank pain or decreased urination.  Continues to eat and drink well.  Has continue to remain active.  Denies recent sickness or antibiotic use.  Reports history of UTIs with similar presentation.  Denies other complaints.  No alleviating measures attempted. Denies chest pain, shortness of breath, or rash. Denies recent sickness. Denies recent antibiotic use.   Verita Lamb, NP: PCP   Past Medical History:  Diagnosis Date  . Anxiety   . Arthritis   . Cancer ([place])    skin ca on face  . Chronic kidney disease    Left kidney stone  . COPD (chronic obstructive pulmonary disease) ([place])   . Depression   . Diabetes mellitus without complication ([place])   . GERD (gastroesophageal reflux disease)   . Hyperlipemia   . Stomach ulcer   . Tobacco abuse disorder     Patient Active Problem List   Diagnosis Date Noted  . Cystocele, grade 2 02/10/2016  . Recurrent UTI 02/10/2016  . UTI (urinary tract infection), bacterial 01/20/2016  . Urinary frequency 01/20/2016  .  Hydronephrosis with urinary obstruction due to ureteral calculus 12/01/2015  . Microscopic hematuria 12/01/2015  . Left flank pain   . UTI (lower urinary tract infection)   . Left ureteral stone 11/16/2015    Past Surgical History:  Procedure Laterality Date  . CYSTOSCOPY W/ URETERAL STENT PLACEMENT Left 12/15/2015   Procedure: CYSTOSCOPY WITH STENT REPLACEMENT;  Surgeon: Hollice Espy, MD;  Location: [facility];  Service: Urology;  Laterality: Left;  . CYSTOSCOPY WITH STENT PLACEMENT Left 11/16/2015   Procedure: CYSTOSCOPY WITH LEFT STENT PLACEMENT, LEFT RETROGRADE;  Surgeon: Dereck Leep, MD;  Location: [facility];  Service: Urology;  Laterality: Left;  . FOOT SURGERY Bilateral   . TUBAL LIGATION    . URETEROSCOPY WITH HOLMIUM LASER LITHOTRIPSY Left 12/15/2015   Procedure: URETEROSCOPY WITH HOLMIUM LASER LITHOTRIPSY;  Surgeon: Hollice Espy, MD;  Location: [facility];  Service: Urology;  Laterality: Left;     No current facility-administered medications for this encounter.   Current Outpatient Medications:  .  aspirin EC 81 MG tablet, Take 81 mg by mouth at bedtime., Disp: , Rfl:  .  clonazePAM (KLONOPIN) 1 MG tablet, Take 1 mg by mouth 2 (two) times daily., Disp: , Rfl:  .  HYDROcodone-acetaminophen (NORCO/VICODIN) 5-325 MG tablet, Take 1-2 tablets by mouth every 6 (six) hours as needed for moderate pain., Disp: 15 tablet, Rfl: 0 .  hydrOXYzine (ATARAX/VISTARIL) 25 MG tablet, Take 25 mg by mouth at bedtime., Disp: , Rfl:  .  metFORMIN (GLUCOPHAGE) 1000 MG tablet, Take 1,000 mg by mouth every  morning., Disp: , Rfl:  .  omeprazole (PRILOSEC OTC) 20 MG tablet, Take 20 mg by mouth daily. Reported on 01/12/2016, Disp: , Rfl:  .  oxyCODONE (OXY IR/ROXICODONE) 5 MG immediate release tablet, Take 1-2 tablets (5-10 mg total) by mouth every 4 (four) hours as needed (1 tablet for pain 4-7/10, 2 tablets for pain 8-10/10)., Disp: 30 tablet, Rfl: 0 .  pravastatin (PRAVACHOL) 20 MG tablet, Take 20  mg by mouth at bedtime., Disp: , Rfl:  .  tiotropium (SPIRIVA) 18 MCG inhalation capsule, Place 18 mcg into inhaler and inhale daily., Disp: , Rfl:  .  cephALEXin (KEFLEX) 500 MG capsule, Take 1 capsule (500 mg total) by mouth 2 (two) times daily for 7 days., Disp: 14 capsule, Rfl: 0 .  docusate sodium (COLACE) 100 MG capsule, Take 1 capsule (100 mg total) by mouth 2 (two) times daily. (Patient not taking: Reported on 03/31/2016), Disp: 10 capsule, Rfl: 0 .  fluconazole (DIFLUCAN) 100 MG tablet, Take 1 tablet (100 mg total) by mouth daily. X 7 days, Disp: 7 tablet, Rfl: 0 .  ibuprofen (ADVIL,MOTRIN) 600 MG tablet, Take 1 tablet (600 mg total) by mouth every 6 (six) hours as needed. (Patient not taking: Reported on 03/31/2016), Disp: 30 tablet, Rfl: 0 .  ipratropium (ATROVENT) 0.02 % nebulizer solution, Inhale into the lungs., Disp: , Rfl:  .  Meth-Hyo-M Bl-Na Phos-Ph Sal (URIBEL) 118 MG CAPS, Take 1 capsule (118 mg total) by mouth 4 (four) times daily., Disp: 40 capsule, Rfl: 3 .  oxybutynin (DITROPAN) 5 MG tablet, Take 1 tablet (5 mg total) by mouth every 8 (eight) hours as needed for bladder spasms., Disp: 30 tablet, Rfl: 0 .  tamsulosin (FLOMAX) 0.4 MG CAPS capsule, Take 1 capsule (0.4 mg total) by mouth daily., Disp: 14 capsule, Rfl: 0 .  venlafaxine XR (EFFEXOR-XR) 75 MG 24 hr capsule, Take 75 mg by mouth daily with breakfast., Disp: , Rfl:   Allergies Patient has no known allergies.  Family History  Problem Relation Age of Onset  . Colon cancer Brother   . Bladder Cancer Neg Hx   . Kidney cancer Neg Hx   . Prostate cancer Neg Hx   . Breast cancer Neg Hx     Social History Social History   Tobacco Use  . Smoking status: Former Smoker    Packs/day: 0.50    Types: Cigarettes  . Smokeless tobacco: Never Used  . Tobacco comment: 10/2015  Substance Use Topics  . Alcohol use: No    Alcohol/week: 0.0 oz  . Drug use: No    Review of Systems Constitutional: No  fever/chills Cardiovascular: Denies chest pain. Respiratory: Denies shortness of breath. Gastrointestinal: As above. Genitourinary: Positive for dysuria. Musculoskeletal: Negative for atypical back pain. Skin: Negative for rash.  ____________________________________________   PHYSICAL EXAM:  VITAL SIGNS: ED Triage Vitals  Enc Vitals Group     BP 10/26/17 1048 139/73     Pulse Rate 10/26/17 1048 64     Resp 10/26/17 1048 16     Temp 10/26/17 1048 97.9 F (36.6 C)     Temp Source 10/26/17 1048 Oral     SpO2 10/26/17 1048 97 %     Weight 10/26/17 1044 162 lb (73.5 kg)     Height 10/26/17 1044 5\' 2"  (1.575 m)     Head Circumference --      Peak Flow --      Pain Score 10/26/17 1043 0     Pain  Loc --      Pain Edu? --      Excl. in [place]? --     Constitutional: Alert and oriented. Well appearing and in no acute distress. Cardiovascular: Normal rate, regular rhythm. Grossly normal heart sounds.  Good peripheral circulation. Respiratory: Normal respiratory effort without tachypnea nor retractions. Breath sounds are clear and equal bilaterally. No wheezes, rales, rhonchi. Gastrointestinal: Mild left upper quadrant abdominal tenderness palpation, per patient chronic and unchanged.  Mild suprapubic tenderness.  Abdomen otherwise soft and nontender.  Normal Bowel sounds. No CVA tenderness. Musculoskeletal: Ambulatory with steady gait. Neurologic:  Normal speech and language. Speech is normal. No gait instability.  Skin:  Skin is warm, dry and intact. No rash noted. Psychiatric: Mood and affect are normal. Speech and behavior are normal. Patient exhibits appropriate insight and judgment   ___________________________________________   LABS (all labs ordered are listed, but only abnormal results are displayed)  Labs Reviewed  URINALYSIS, COMPLETE (UACMP) WITH MICROSCOPIC - Abnormal; Notable for the following components:      Result Value   Color, Urine STRAW (*)    Glucose, UA >1000  (*)    Nitrite POSITIVE (*)    Leukocytes, UA TRACE (*)    Bacteria, UA MANY (*)    All other components within normal limits  GLUCOSE, CAPILLARY - Abnormal; Notable for the following components:   Glucose-Capillary 161 (*)    All other components within normal limits  URINE CULTURE  CBG MONITORING, ED    PROCEDURES Procedures   INITIAL IMPRESSION / ASSESSMENT AND PLAN / ED COURSE  Pertinent labs & imaging results that were available during my care of the patient were reviewed by me and considered in my medical decision making (see chart for details).  Well-appearing patient.  No acute distress.  Urinalysis reviewed, UTI.  Will culture urine.  Glucose in urine, rechecked by fingerstick and was 161.  Will treat patient with oral Keflex.  Encouraged rest, fluids, supportive care.  Discussed strict follow-up and return parameters.Discussed indication, risks and benefits of medications with patient.  Discussed follow up with Primary care physician this week. Discussed follow up and return parameters including no resolution or any worsening concerns. Patient verbalized understanding and agreed to plan.   ____________________________________________   FINAL CLINICAL IMPRESSION(S) / ED DIAGNOSES  Final diagnoses:  Urinary tract infection without hematuria, site unspecified     ED Discharge Orders        Ordered    cephALEXin (KEFLEX) 500 MG capsule  2 times daily     10/26/17 1128       Note: This dictation was prepared with Dragon dictation along with smaller phrase technology. Any transcriptional errors that result from this process are unintentional.         Marylene Land, NP 10/26/17 1134

## 2017-10-28 LAB — URINE CULTURE

## 2017-11-20 ENCOUNTER — Ambulatory Visit
Admission: EM | Admit: 2017-11-20 | Discharge: 2017-11-20 | Disposition: A | Discharge status: Home / Self Care | Payer: Medicare Other | Attending: Emergency Medicine | Admitting: Emergency Medicine

## 2017-11-20 ENCOUNTER — Other Ambulatory Visit: Payer: Self-pay

## 2017-11-20 DIAGNOSIS — J101 Influenza due to other identified influenza virus with other respiratory manifestations: Secondary | ICD-10-CM

## 2017-11-20 DIAGNOSIS — J09X2 Influenza due to identified novel influenza A virus with other respiratory manifestations: Secondary | ICD-10-CM

## 2017-11-20 DIAGNOSIS — B372 Candidiasis of skin and nail: Secondary | ICD-10-CM | POA: Diagnosis not present

## 2017-11-20 DIAGNOSIS — M791 Myalgia, unspecified site: Secondary | ICD-10-CM | POA: Diagnosis not present

## 2017-11-20 DIAGNOSIS — R05 Cough: Secondary | ICD-10-CM

## 2017-11-20 DIAGNOSIS — J029 Acute pharyngitis, unspecified: Secondary | ICD-10-CM | POA: Diagnosis not present

## 2017-11-20 DIAGNOSIS — J441 Chronic obstructive pulmonary disease with (acute) exacerbation: Secondary | ICD-10-CM | POA: Diagnosis not present

## 2017-11-20 DIAGNOSIS — N3001 Acute cystitis with hematuria: Secondary | ICD-10-CM | POA: Diagnosis not present

## 2017-11-20 DIAGNOSIS — R5383 Other fatigue: Secondary | ICD-10-CM | POA: Diagnosis not present

## 2017-11-20 DIAGNOSIS — R109 Unspecified abdominal pain: Secondary | ICD-10-CM

## 2017-11-20 LAB — URINALYSIS, COMPLETE (UACMP) WITH MICROSCOPIC
Glucose, UA: NEGATIVE mg/dL
KETONES UR: 40 mg/dL — AB
Nitrite: NEGATIVE
Specific Gravity, Urine: 1.015 (ref 1.005–1.030)
pH: 5.5 (ref 5.0–8.0)

## 2017-11-20 LAB — RAPID INFLUENZA A&B ANTIGENS
Influenza A (ARMC): POSITIVE — AB
Influenza B (ARMC): NEGATIVE

## 2017-11-20 LAB — RAPID STREP SCREEN (MED CTR MEBANE ONLY): STREPTOCOCCUS, GROUP A SCREEN (DIRECT): NEGATIVE

## 2017-11-20 MED ORDER — PREDNISONE 20 MG PO TABS: 40.0000 mg | ORAL_TABLET | Freq: Every day | ORAL | 0 refills | Status: AC

## 2017-11-20 MED ORDER — AEROCHAMBER PLUS MISC: 2 refills | Status: DC

## 2017-11-20 MED ORDER — ALBUTEROL SULFATE HFA 108 (90 BASE) MCG/ACT IN AERS: 1.0000 | INHALATION_SPRAY | Freq: Four times a day (QID) | RESPIRATORY_TRACT | 0 refills | Status: DC | PRN

## 2017-11-20 MED ORDER — PHENAZOPYRIDINE HCL 200 MG PO TABS: 200.0000 mg | ORAL_TABLET | Freq: Three times a day (TID) | ORAL | 0 refills | Status: DC | PRN

## 2017-11-20 MED ORDER — FLUCONAZOLE 150 MG PO TABS: 150.0000 mg | ORAL_TABLET | Freq: Once | ORAL | 1 refills | Status: AC

## 2017-11-20 MED ORDER — OSELTAMIVIR PHOSPHATE 75 MG PO CAPS: 75.0000 mg | ORAL_CAPSULE | Freq: Two times a day (BID) | ORAL | 0 refills | Status: DC

## 2017-11-20 MED ORDER — IPRATROPIUM-ALBUTEROL 0.5-2.5 (3) MG/3ML IN SOLN
3.0000 mL | Freq: Once | RESPIRATORY_TRACT | Status: AC
  Administered 2017-11-20: 3 mL via RESPIRATORY_TRACT

## 2017-11-20 MED ORDER — NITROFURANTOIN MONOHYD MACRO 100 MG PO CAPS: 100.0000 mg | ORAL_CAPSULE | Freq: Two times a day (BID) | ORAL | 0 refills | Status: DC

## 2017-11-20 MED ORDER — IBUPROFEN 600 MG PO TABS: 600.0000 mg | ORAL_TABLET | Freq: Four times a day (QID) | ORAL | 0 refills | Status: DC | PRN

## 2017-11-20 NOTE — Discharge Instructions (Signed)
1.  Influenza with COPD exacerbation.  Finish Tamiflu.  2 puffs from your albuterol inhaler using her spacer every 4-6 hours.  Finish the prednisone.  Ibuprofen 600 mg to take with 1 g of Tylenol for sore throat, body aches, headaches.  2.  UTI.  Finish the [employer].  Pyridium as needed for low midline abdominal pain.  Sending your urine off for culture to make sure that we have you on the right antibiotic.  Give [place] a working phone number so that we can contact you if necessary.  3.  Yeast infection.  Diflucan.  Take 1 pill today, and if still having symptoms in 72 hours, take the other pill.

## 2017-11-20 NOTE — ED Triage Notes (Addendum)
Pt with generalized body aches, otalgia, sore throat, and cough x Thursday. Pt asking for a work note. Pt 1/2 PPD smoker but reports she hasn't smoked as much the past few days

## 2017-11-20 NOTE — ED Provider Notes (Signed)
HPI  SUBJECTIVE:  Samantha Welch is a 67 y.o. female who presents with 2 complaints.  First she reports 3 days of body aches, fatigue, feeling feverish, bilateral ear pain, headache, nasal congestion, postnasal drip, sore throat.  She reports a cough productive of white phlegm, wheezing, shortness of breath, dyspnea on exertion.  She is coughing up more sputum than normal but denies change in color.  No rhinorrhea, sinus pain or pressure, chest pain.  She tried an unknown cold and flu medication with improvement in her symptoms.  Takes Pulmicort, does not have any albuterol at home.  No aggravating factors.  No antipyretic in the past 6-8 hours.  Second, she states that her "bladder hurts".  Reports low midline abdominal pain when she has to urinate.  She denies dysuria, urgency, frequency, cloudy odorous urine. Patient was seen here on 2/13 for UTI.  Found to have a E. coli UTI that was pansensitive.  She was treated with Keflex for 7 days.  States that she got better and then her symptoms return.  No other abdominal pain, vaginal odor, discharge, bleeding, rash, itching.  No back pain.  She has a past medical history of COPD, has been smoking approximately pack a day for the past 10 years.  Also history of recurrent UTIs, diabetes.  No history of hypertension.  TFT:DDUKGURKY, Kristin Bruins, NP   Past Medical History:  Diagnosis Date  . Anxiety   . Arthritis   . Cancer ([place])    skin ca on face  . Chronic kidney disease    Left kidney stone  . COPD (chronic obstructive pulmonary disease) ([place])   . Depression   . Diabetes mellitus without complication ([place])   . GERD (gastroesophageal reflux disease)   . Hyperlipemia   . Stomach ulcer   . Tobacco abuse disorder     Past Surgical History:  Procedure Laterality Date  . CYSTOSCOPY W/ URETERAL STENT PLACEMENT Left 12/15/2015   Procedure: CYSTOSCOPY WITH STENT REPLACEMENT;  Surgeon: Hollice Espy, MD;  Location: [facility];  Service: Urology;  Laterality:  Left;  . CYSTOSCOPY WITH STENT PLACEMENT Left 11/16/2015   Procedure: CYSTOSCOPY WITH LEFT STENT PLACEMENT, LEFT RETROGRADE;  Surgeon: Dereck Leep, MD;  Location: [facility];  Service: Urology;  Laterality: Left;  . FOOT SURGERY Bilateral   . TUBAL LIGATION    . URETEROSCOPY WITH HOLMIUM LASER LITHOTRIPSY Left 12/15/2015   Procedure: URETEROSCOPY WITH HOLMIUM LASER LITHOTRIPSY;  Surgeon: Hollice Espy, MD;  Location: [facility];  Service: Urology;  Laterality: Left;    Family History  Problem Relation Age of Onset  . Colon cancer Brother   . Bladder Cancer Neg Hx   . Kidney cancer Neg Hx   . Prostate cancer Neg Hx   . Breast cancer Neg Hx     Social History   Tobacco Use  . Smoking status: Current Every Day Smoker    Packs/day: 0.50    Types: Cigarettes  . Smokeless tobacco: Never Used  . Tobacco comment: 10/2015  Substance Use Topics  . Alcohol use: No    Alcohol/week: 0.0 oz  . Drug use: No    No current facility-administered medications for this encounter.   Current Outpatient Medications:  .  albuterol (PROVENTIL HFA;VENTOLIN HFA) 108 (90 Base) MCG/ACT inhaler, Inhale 1-2 puffs into the lungs every 6 (six) hours as needed for wheezing or shortness of breath., Disp: 1 Inhaler, Rfl: 0 .  aspirin EC 81 MG tablet, Take 81 mg by mouth at  bedtime., Disp: , Rfl:  .  clonazePAM (KLONOPIN) 1 MG tablet, Take 1 mg by mouth 2 (two) times daily., Disp: , Rfl:  .  ibuprofen (ADVIL,MOTRIN) 600 MG tablet, Take 1 tablet (600 mg total) by mouth every 6 (six) hours as needed., Disp: 30 tablet, Rfl: 0 .  ipratropium (ATROVENT) 0.02 % nebulizer solution, Inhale into the lungs., Disp: , Rfl:  .  metFORMIN (GLUCOPHAGE) 1000 MG tablet, Take 1,000 mg by mouth every morning., Disp: , Rfl:  .  Meth-Hyo-M Bl-Na Phos-Ph Sal (URIBEL) 118 MG CAPS, Take 1 capsule (118 mg total) by mouth 4 (four) times daily., Disp: 40 capsule, Rfl: 3 .  nitrofurantoin, macrocrystal-monohydrate, (MACROBID) 100 MG  capsule, Take 1 capsule (100 mg total) by mouth 2 (two) times daily. X 5 days, Disp: 10 capsule, Rfl: 0 .  omeprazole (PRILOSEC OTC) 20 MG tablet, Take 20 mg by mouth daily. Reported on 01/12/2016, Disp: , Rfl:  .  oseltamivir (TAMIFLU) 75 MG capsule, Take 1 capsule (75 mg total) by mouth 2 (two) times daily. X 5 days, Disp: 10 capsule, Rfl: 0 .  phenazopyridine (PYRIDIUM) 200 MG tablet, Take 1 tablet (200 mg total) by mouth 3 (three) times daily as needed for pain., Disp: 6 tablet, Rfl: 0 .  pravastatin (PRAVACHOL) 20 MG tablet, Take 20 mg by mouth at bedtime., Disp: , Rfl:  .  predniSONE (DELTASONE) 20 MG tablet, Take 2 tablets (40 mg total) by mouth daily with breakfast for 5 days., Disp: 10 tablet, Rfl: 0 .  Spacer/Aero-Holding Chambers (AEROCHAMBER PLUS) inhaler, Use as instructed, Disp: 1 each, Rfl: 2 .  tiotropium (SPIRIVA) 18 MCG inhalation capsule, Place 18 mcg into inhaler and inhale daily., Disp: , Rfl:  .  venlafaxine XR (EFFEXOR-XR) 75 MG 24 hr capsule, Take 75 mg by mouth daily with breakfast., Disp: , Rfl:   No Known Allergies   ROS  As noted in HPI.   Physical Exam  BP 135/66 (BP Location: Left Arm)   Pulse 88   Temp 98.5 F (36.9 C) (Oral)   Resp (!) 24   Ht 5\' 2"  (1.575 m)   Wt 163 lb (73.9 kg)   SpO2 92%   BMI 29.81 kg/m   Constitutional: Well developed, well nourished, no acute distress Eyes: PERRL, EOMI, conjunctiva normal bilaterally HENT: Normocephalic, atraumatic,mucus membranes moist.  TMs normal bilaterally.  Positive mild nasal congestion.  No sinus tenderness.  Erythematous oropharynx, tonsils without exudates.  Uvula midline.  Positive post nasal drip. Neck: Positive tender cervical lymphadenopathy Respiratory: Clear to auscultation bilaterally, no rales, no wheezing, no rhonchi.  Diffuse chest wall tenderness  cardiovascular: Normal rate and rhythm, no murmurs, no gallops, no rubs GI: Soft, nondistended, normal bowel sounds, positive suprapubic  tenderness, no flank tenderness, no rebound, no guarding Back: no CVAT skin: No rash, skin intact Musculoskeletal: No edema, no tenderness, no deformities Neurologic: Alert & oriented x 3, CN II-XII grossly intact, no motor deficits, sensation grossly intact Psychiatric: Speech and behavior appropriate   ED Course   Medications  ipratropium-albuterol (DUONEB) 0.5-2.5 (3) MG/3ML nebulizer solution 3 mL (3 mLs Nebulization Given 11/20/17 0944)    Orders Placed This Encounter  Procedures  . Urine culture    Standing Status:   Standing    Number of Occurrences:   1    Order Specific Question:   Patient immune status    Answer:   Normal  . Rapid strep screen    Standing Status:   Standing  Number of Occurrences:   1    Order Specific Question:   Patient immune status    Answer:   Normal  . Rapid Influenza A&B Antigens ([facility] only)    Standing Status:   Standing    Number of Occurrences:   1  . Culture, group A strep    Standing Status:   Standing    Number of Occurrences:   1  . Urinalysis, Complete w Microscopic    Standing Status:   Standing    Number of Occurrences:   1   Results for orders placed or performed during the hospital encounter of 11/20/17 (from the past 24 hour(s))  Urinalysis, Complete w Microscopic     Status: Abnormal   Collection Time: 11/20/17  9:44 AM  Result Value Ref Range   Color, Urine YELLOW YELLOW   APPearance HAZY (A) CLEAR   Specific Gravity, Urine 1.015 1.005 - 1.030   pH 5.5 5.0 - 8.0   Glucose, UA NEGATIVE NEGATIVE mg/dL   Hgb urine dipstick MODERATE (A) NEGATIVE   Bilirubin Urine SMALL (A) NEGATIVE   Ketones, ur 40 (A) NEGATIVE mg/dL   Protein, ur TRACE (A) NEGATIVE mg/dL   Nitrite NEGATIVE NEGATIVE   Leukocytes, UA TRACE (A) NEGATIVE   Squamous Epithelial / LPF 6-30 (A) NONE SEEN   WBC, UA 6-30 0 - 5 WBC/hpf   RBC / HPF 6-30 0 - 5 RBC/hpf   Bacteria, UA MANY (A) NONE SEEN   Budding Yeast PRESENT    Amorphous Crystal PRESENT    Rapid strep screen     Status: None   Collection Time: 11/20/17  9:44 AM  Result Value Ref Range   Streptococcus, Group A Screen (Direct) NEGATIVE NEGATIVE  Rapid Influenza A&B Antigens ([facility] only)     Status: Abnormal   Collection Time: 11/20/17  9:44 AM  Result Value Ref Range   Influenza A ([facility]) POSITIVE (A) NEGATIVE   Influenza B ([facility]) NEGATIVE NEGATIVE   No results found.  ED Clinical Impression  Influenza A  COPD exacerbation ([place])  Acute cystitis with hematuria  Skin yeast infection   ED Assessment/Plan   [place] [facility] database reviewed for this patient, and feel that the risk/benefit ratio today is favorable for proceeding with a prescription for controlled substance.  Previous records, labs reviewed.  As noted in HPI.  Presentation consistent with influenza.  she does have an erythematous oropharynx so we will check for strep in addition to flu.  Imaging deferred because she has no focal lung findings.  Suspect COPD exacerbation from the flu.  She also may have a urinary tract infection.  Checking UA, and will send off for culture because of the recent antibiotic use.  Plan to send her home with Macrobid because last urine culture was pansensitive.   Will try DuoNeb as well.  Will reevaluate.  Repeat O2 sat post DuoNeb 92%.  Faint expiratory wheezing throughout all lung fields.  Patient states that she feels better.  Labs reviewed.   1.  Influenza a with COPD exacerbation.  She is flu a positive, strep negative.  Starting Tamiflu.  Will send home an albuterol inhaler with a spacer, prednisone 40 mg for 5 days, ibuprofen 600 mg with 1 g of Tylenol for body aches, headaches.  Deferring antibiotics as we have a cause for her symptoms.   2: Possible UTI.  She gave [place] a contaminated urine sample, however given the suprapubic tenderness, we will treat her as a UTI.  Macrobid, Pyridium and urine culture.  Normal kidney function in 11/2015.  3.  Yeast in urine.  Even though  patient denies any vaginal itching, discharge, she would like to be treated for this anyway.  Diflucan 150 mg p.o. times 1, repeat in 72 hours.  Follow-up with PMD as needed.  Discussed labs,  MDM, plan and followup with patient Discussed sn/sx that should prompt return to the ED. patient agrees with plan.   Meds ordered this encounter  Medications  . ipratropium-albuterol (DUONEB) 0.5-2.5 (3) MG/3ML nebulizer solution 3 mL  . nitrofurantoin, macrocrystal-monohydrate, (MACROBID) 100 MG capsule    Sig: Take 1 capsule (100 mg total) by mouth 2 (two) times daily. X 5 days    Dispense:  10 capsule    Refill:  0  . phenazopyridine (PYRIDIUM) 200 MG tablet    Sig: Take 1 tablet (200 mg total) by mouth 3 (three) times daily as needed for pain.    Dispense:  6 tablet    Refill:  0  . fluconazole (DIFLUCAN) 150 MG tablet    Sig: Take 1 tablet (150 mg total) by mouth once for 1 dose. 1 tab po x 1. May repeat in 72 hours if no improvement    Dispense:  2 tablet    Refill:  1  . albuterol (PROVENTIL HFA;VENTOLIN HFA) 108 (90 Base) MCG/ACT inhaler    Sig: Inhale 1-2 puffs into the lungs every 6 (six) hours as needed for wheezing or shortness of breath.    Dispense:  1 Inhaler    Refill:  0  . Spacer/Aero-Holding Chambers (AEROCHAMBER PLUS) inhaler    Sig: Use as instructed    Dispense:  1 each    Refill:  2  . predniSONE (DELTASONE) 20 MG tablet    Sig: Take 2 tablets (40 mg total) by mouth daily with breakfast for 5 days.    Dispense:  10 tablet    Refill:  0  . oseltamivir (TAMIFLU) 75 MG capsule    Sig: Take 1 capsule (75 mg total) by mouth 2 (two) times daily. X 5 days    Dispense:  10 capsule    Refill:  0  . ibuprofen (ADVIL,MOTRIN) 600 MG tablet    Sig: Take 1 tablet (600 mg total) by mouth every 6 (six) hours as needed.    Dispense:  30 tablet    Refill:  0    *This clinic note was created using [employer]. Therefore, there may be occasional mistakes despite careful  proofreading.  ?   Melynda Ripple, MD 11/21/17 [phone]

## 2017-11-22 LAB — URINE CULTURE
Culture: <10000 — AB
Special Requests: NORMAL

## 2017-11-23 LAB — CULTURE, GROUP A STREP (THRC)

## 2018-01-10 ENCOUNTER — Ambulatory Visit
Admission: EM | Admit: 2018-01-10 | Discharge: 2018-01-10 | Disposition: A | Discharge status: Home / Self Care | Payer: Medicare Other | Attending: Family Medicine | Admitting: Family Medicine

## 2018-01-10 ENCOUNTER — Other Ambulatory Visit: Payer: Self-pay

## 2018-01-10 DIAGNOSIS — M62838 Other muscle spasm: Secondary | ICD-10-CM

## 2018-01-10 MED ORDER — CYCLOBENZAPRINE HCL 10 MG PO TABS: 10.0000 mg | ORAL_TABLET | Freq: Three times a day (TID) | ORAL | 0 refills | Status: DC | PRN

## 2018-01-10 NOTE — Discharge Instructions (Signed)
Rest.  Heat, hot shower/bath.  Medication as needed. Do not drive while taking this medication.  Take care  Dr. Lacinda Axon

## 2018-01-10 NOTE — ED Triage Notes (Signed)
Pt states she was assembling a table on Friday, and started having right sided neck pain the next day. It is worsening. Radiating to shoulder. Denies chest pain, SOB, vomiting. States the pain is making her feel nauseous.

## 2018-01-10 NOTE — ED Provider Notes (Signed)
[facility]    CSN: [id number] Arrival date & time: 01/10/18  1714  History   Chief Complaint Chief Complaint  Patient presents with  . Neck Pain   HPI  67 year old Welch presents with neck pain.  Patient reports that the pain started on Saturday.  She states that she had a similar table on Friday that this may have been the cause.  She reports that her pain is severe.  Pain is located in the right trapezius.  She reports that the pain radiates upwards and towards her shoulder.  No chest pain.  No shortness of breath.  No reports of fall or direct trauma.  No known exacerbating factors.   she has tried Aspercreme without improvement.  No other associated symptoms.  No other complaints.  Past Medical History:  Diagnosis Date  . Anxiety   . Arthritis   . Cancer ([place])    skin ca on face  . Chronic kidney disease    Left kidney stone  . COPD (chronic obstructive pulmonary disease) ([place])   . Depression   . Diabetes mellitus without complication ([place])   . GERD (gastroesophageal reflux disease)   . Hyperlipemia   . Stomach ulcer   . Tobacco abuse disorder     Patient Active Problem List   Diagnosis Date Noted  . Cystocele, grade 2 02/10/2016  . Recurrent UTI 02/10/2016  . UTI (urinary tract infection), bacterial 01/20/2016  . Urinary frequency 01/20/2016  . Hydronephrosis with urinary obstruction due to ureteral calculus 12/01/2015  . Microscopic hematuria 12/01/2015  . Left flank pain   . UTI (lower urinary tract infection)   . Left ureteral stone 11/16/2015    Past Surgical History:  Procedure Laterality Date  . CYSTOSCOPY W/ URETERAL STENT PLACEMENT Left 12/15/2015   Procedure: CYSTOSCOPY WITH STENT REPLACEMENT;  Surgeon: Hollice Espy, MD;  Location: [facility];  Service: Urology;  Laterality: Left;  . CYSTOSCOPY WITH STENT PLACEMENT Left 11/16/2015   Procedure: CYSTOSCOPY WITH LEFT STENT PLACEMENT, LEFT RETROGRADE;  Surgeon: Dereck Leep, MD;   Location: [facility];  Service: Urology;  Laterality: Left;  . FOOT SURGERY Bilateral   . TUBAL LIGATION    . URETEROSCOPY WITH HOLMIUM LASER LITHOTRIPSY Left 12/15/2015   Procedure: URETEROSCOPY WITH HOLMIUM LASER LITHOTRIPSY;  Surgeon: Hollice Espy, MD;  Location: [facility];  Service: Urology;  Laterality: Left;    OB History    Gravida  2   Para  2   Term  2   Preterm  0   AB  0   Living  0     SAB  0   TAB  0   Ectopic  0   Multiple  0   Live Births               Home Medications    Prior to Admission medications   Medication Sig Start Date End Date Taking? Authorizing Provider  albuterol (PROVENTIL HFA;VENTOLIN HFA) 108 (90 Base) MCG/ACT inhaler Inhale 1-2 puffs into the lungs every 6 (six) hours as needed for wheezing or shortness of breath. 11/20/17  Yes Melynda Ripple, MD  aspirin EC 81 MG tablet Take 81 mg by mouth at bedtime.   Yes [provider]  clonazePAM (KLONOPIN) 1 MG tablet Take 1 mg by mouth 2 (two) times daily.   Yes [provider]  ipratropium (ATROVENT) 0.02 % nebulizer solution Inhale into the lungs. 07/16/11  Yes [provider]  metFORMIN (GLUCOPHAGE) 1000 MG tablet  Take 1,000 mg by mouth every morning. 10/05/15  Yes [provider]  omeprazole (PRILOSEC OTC) 20 MG tablet Take 20 mg by mouth daily. Reported on 01/12/2016   Yes [provider]  pravastatin (PRAVACHOL) 20 MG tablet Take 20 mg by mouth at bedtime. 11/03/15  Yes [provider]  Spacer/Aero-Holding Chambers (AEROCHAMBER PLUS) inhaler Use as instructed 11/20/17  Yes Melynda Ripple, MD  tiotropium (SPIRIVA) 18 MCG inhalation capsule Place 18 mcg into inhaler and inhale daily.   Yes [provider]  cyclobenzaprine (FLEXERIL) 10 MG tablet Take 1 tablet (10 mg total) by mouth 3 (three) times daily as needed. 01/10/18   Coral Spikes, DO  Meth-Hyo-M Bl-Na Phos-Ph Sal (URIBEL) 118 MG CAPS Take 1 capsule (118 mg total) by mouth  4 (four) times daily. 01/21/16   Zara Council A, PA-C  phenazopyridine (PYRIDIUM) 200 MG tablet Take 1 tablet (200 mg total) by mouth 3 (three) times daily as needed for pain. 11/20/17   Melynda Ripple, MD  venlafaxine XR (EFFEXOR-XR) 75 MG 24 hr capsule Take 75 mg by mouth daily with breakfast.    [provider]    Family History Family History  Problem Relation Age of Onset  . Colon cancer Brother   . Bladder Cancer Neg Hx   . Kidney cancer Neg Hx   . Prostate cancer Neg Hx   . Breast cancer Neg Hx     Social History Social History   Tobacco Use  . Smoking status: Current Every Day Smoker    Packs/day: 0.50    Types: Cigarettes  . Smokeless tobacco: Never Used  Substance Use Topics  . Alcohol use: No    Alcohol/week: 0.0 oz  . Drug use: No     Allergies   Patient has no known allergies.   Review of Systems Review of Systems  Constitutional: Negative.   Respiratory: Negative.   Musculoskeletal:       Pain, right trapezius.   Physical Exam Triage Vital Signs ED Triage Vitals  Enc Vitals Group     BP 01/10/18 1730 129/78     Pulse Rate 01/10/18 1730 72     Resp 01/10/18 1730 16     Temp 01/10/18 1730 98.3 F (36.8 C)     Temp Source 01/10/18 1730 Oral     SpO2 01/10/18 1730 97 %     Weight --      Height --      Head Circumference --      Peak Flow --      Pain Score 01/10/18 1733 10     Pain Loc --      Pain Edu? --      Excl. in [place]? --    Updated Vital Signs BP 129/78 (BP Location: Left Arm)   Pulse 72   Temp 98.3 F (36.8 C) (Oral)   Resp 16   SpO2 97%   Physical Exam  Constitutional: She appears well-developed. No distress.  HENT:  Head: Normocephalic and atraumatic.  Cardiovascular: Normal rate and regular rhythm.  Pulmonary/Chest: Effort normal and breath sounds normal. She has no wheezes. She has no rales.  Musculoskeletal:  Patient with exquisite tenderness palpation of the right trapezius.  Muscle spasm noted.    Neurological: She is alert.  Psychiatric: Her behavior is normal.  Flat affect.  Nursing note and vitals reviewed.  UC Treatments / Results  Labs (all labs ordered are listed, but only abnormal results are displayed) Labs Reviewed -  No data to display  EKG None  Radiology No results found.  Procedures Procedures (including critical care time)  Medications Ordered in UC Medications - No data to display  Initial Impression / Assessment and Plan / UC Course  I have reviewed the triage vital signs and the nursing notes.  Pertinent labs & imaging results that were available during my care of the patient were reviewed by me and considered in my medical decision making (see chart for details).    67 year old Welch presents with a trapezius muscle spasm.  Treating with Flexeril.  Final Clinical Impressions(s) / UC Diagnoses   Final diagnoses:  Trapezius muscle spasm    ED Prescriptions    Medication Sig Dispense Auth. Provider   cyclobenzaprine (FLEXERIL) 10 MG tablet Take 1 tablet (10 mg total) by mouth 3 (three) times daily as needed. 30 tablet Coral Spikes, DO     Controlled Substance Prescriptions [place] Controlled Substance Registry consulted? Not Applicable   Coral Spikes, DO 01/10/18 1827

## 2018-11-08 ENCOUNTER — Other Ambulatory Visit: Payer: Self-pay | Admitting: Family

## 2018-11-08 DIAGNOSIS — Z1231 Encounter for screening mammogram for malignant neoplasm of breast: Secondary | ICD-10-CM

## 2018-12-25 DIAGNOSIS — N390 Urinary tract infection, site not specified: Secondary | ICD-10-CM | POA: Insufficient documentation

## 2018-12-25 DIAGNOSIS — Z87442 Personal history of urinary calculi: Secondary | ICD-10-CM | POA: Insufficient documentation

## 2020-02-01 ENCOUNTER — Other Ambulatory Visit: Payer: Self-pay | Admitting: Orthopedic Surgery

## 2020-02-01 DIAGNOSIS — M25362 Other instability, left knee: Secondary | ICD-10-CM

## 2020-02-17 ENCOUNTER — Other Ambulatory Visit: Payer: Self-pay

## 2020-02-17 ENCOUNTER — Ambulatory Visit
Admission: RE | Admit: 2020-02-17 | Discharge: 2020-02-17 | Disposition: A | Discharge status: Home / Self Care | Payer: Medicare Other | Source: Ambulatory Visit | Attending: Orthopedic Surgery | Admitting: Orthopedic Surgery

## 2020-02-17 DIAGNOSIS — M25362 Other instability, left knee: Secondary | ICD-10-CM | POA: Diagnosis present

## 2020-03-13 ENCOUNTER — Encounter: Payer: Self-pay | Admitting: Orthopedic Surgery

## 2020-03-13 ENCOUNTER — Other Ambulatory Visit: Payer: Self-pay

## 2020-03-14 ENCOUNTER — Other Ambulatory Visit: Payer: Self-pay | Admitting: Orthopedic Surgery

## 2020-03-18 ENCOUNTER — Other Ambulatory Visit: Payer: Self-pay

## 2020-03-18 ENCOUNTER — Other Ambulatory Visit
Admission: RE | Admit: 2020-03-18 | Discharge: 2020-03-18 | Disposition: A | Discharge status: Home / Self Care | Payer: Medicare Other | Source: Ambulatory Visit | Attending: Orthopedic Surgery | Admitting: Orthopedic Surgery

## 2020-03-18 DIAGNOSIS — Z01812 Encounter for preprocedural laboratory examination: Secondary | ICD-10-CM | POA: Diagnosis present

## 2020-03-18 DIAGNOSIS — Z20822 Contact with and (suspected) exposure to covid-19: Secondary | ICD-10-CM | POA: Insufficient documentation

## 2020-03-18 LAB — SARS CORONAVIRUS 2 (TAT 6-24 HRS): SARS Coronavirus 2: NEGATIVE

## 2020-03-19 NOTE — Anesthesia Preprocedure Evaluation (Addendum)
Anesthesia Evaluation  Patient identified by MRN, date of birth, ID band Patient awake    Reviewed: Allergy & Precautions, NPO status , Patient's Chart, lab work & pertinent test results, reviewed documented beta blocker date and time   History of Anesthesia Complications Negative for: history of anesthetic complications  Airway Mallampati: III  TM Distance: >3 FB Neck ROM: Full    Dental   Pulmonary COPD, Current Smoker and Patient abstained from smoking.,    breath sounds clear to auscultation       Cardiovascular (-) angina(-) DOE  Rhythm:Regular Rate:Normal   HLD   Neuro/Psych Anxiety Depression    GI/Hepatic PUD, GERD  Controlled,  Endo/Other  diabetes  Renal/GU CRFRenal disease     Musculoskeletal  (+) Arthritis ,   Abdominal   Peds  Hematology   Anesthesia Other Findings Skin cancer  Reproductive/Obstetrics                            Anesthesia Physical Anesthesia Plan  ASA: II  Anesthesia Plan: General   Post-op Pain Management:    Induction: Intravenous  PONV Risk Score and Plan: 3 and Treatment may vary due to age or medical condition, Ondansetron and Dexamethasone  Airway Management Planned: LMA  Additional Equipment:   Intra-op Plan:   Post-operative Plan: Extubation in OR  Informed Consent: I have reviewed the patients History and Physical, chart, labs and discussed the procedure including the risks, benefits and alternatives for the proposed anesthesia with the patient or authorized representative who has indicated his/her understanding and acceptance.       Plan Discussed with: CRNA and Anesthesiologist  Anesthesia Plan Comments:         Anesthesia Quick Evaluation

## 2020-03-20 ENCOUNTER — Ambulatory Visit
Admission: RE | Admit: 2020-03-20 | Discharge: 2020-03-20 | Disposition: A | Discharge status: Home / Self Care | Payer: Medicare Other | Attending: Orthopedic Surgery | Admitting: Orthopedic Surgery

## 2020-03-20 ENCOUNTER — Encounter: Payer: Self-pay | Admitting: Orthopedic Surgery

## 2020-03-20 ENCOUNTER — Ambulatory Visit: Payer: Medicare Other | Admitting: Anesthesiology

## 2020-03-20 ENCOUNTER — Other Ambulatory Visit: Payer: Self-pay

## 2020-03-20 ENCOUNTER — Encounter
Admission: RE | Disposition: A | Discharge status: Home / Self Care | Payer: Self-pay | Source: Home / Self Care | Attending: Orthopedic Surgery

## 2020-03-20 DIAGNOSIS — Z8711 Personal history of peptic ulcer disease: Secondary | ICD-10-CM | POA: Diagnosis not present

## 2020-03-20 DIAGNOSIS — F419 Anxiety disorder, unspecified: Secondary | ICD-10-CM | POA: Diagnosis not present

## 2020-03-20 DIAGNOSIS — Z85828 Personal history of other malignant neoplasm of skin: Secondary | ICD-10-CM | POA: Insufficient documentation

## 2020-03-20 DIAGNOSIS — E119 Type 2 diabetes mellitus without complications: Secondary | ICD-10-CM | POA: Insufficient documentation

## 2020-03-20 DIAGNOSIS — S83282A Other tear of lateral meniscus, current injury, left knee, initial encounter: Secondary | ICD-10-CM | POA: Insufficient documentation

## 2020-03-20 DIAGNOSIS — X58XXXA Exposure to other specified factors, initial encounter: Secondary | ICD-10-CM | POA: Insufficient documentation

## 2020-03-20 DIAGNOSIS — K219 Gastro-esophageal reflux disease without esophagitis: Secondary | ICD-10-CM | POA: Insufficient documentation

## 2020-03-20 DIAGNOSIS — M1990 Unspecified osteoarthritis, unspecified site: Secondary | ICD-10-CM | POA: Insufficient documentation

## 2020-03-20 DIAGNOSIS — E785 Hyperlipidemia, unspecified: Secondary | ICD-10-CM | POA: Insufficient documentation

## 2020-03-20 DIAGNOSIS — F329 Major depressive disorder, single episode, unspecified: Secondary | ICD-10-CM | POA: Diagnosis not present

## 2020-03-20 DIAGNOSIS — Z79899 Other long term (current) drug therapy: Secondary | ICD-10-CM | POA: Diagnosis not present

## 2020-03-20 DIAGNOSIS — Z791 Long term (current) use of non-steroidal anti-inflammatories (NSAID): Secondary | ICD-10-CM | POA: Insufficient documentation

## 2020-03-20 DIAGNOSIS — S83232A Complex tear of medial meniscus, current injury, left knee, initial encounter: Secondary | ICD-10-CM | POA: Insufficient documentation

## 2020-03-20 DIAGNOSIS — S83242A Other tear of medial meniscus, current injury, left knee, initial encounter: Secondary | ICD-10-CM | POA: Diagnosis present

## 2020-03-20 DIAGNOSIS — J449 Chronic obstructive pulmonary disease, unspecified: Secondary | ICD-10-CM | POA: Insufficient documentation

## 2020-03-20 DIAGNOSIS — F17210 Nicotine dependence, cigarettes, uncomplicated: Secondary | ICD-10-CM | POA: Diagnosis not present

## 2020-03-20 DIAGNOSIS — Z7982 Long term (current) use of aspirin: Secondary | ICD-10-CM | POA: Insufficient documentation

## 2020-03-20 HISTORY — PX: KNEE ARTHROSCOPY WITH MEDIAL MENISECTOMY: SHX5651

## 2020-03-20 LAB — GLUCOSE, CAPILLARY
Glucose-Capillary: 178 mg/dL — ABNORMAL HIGH (ref 70–99)
Glucose-Capillary: 186 mg/dL — ABNORMAL HIGH (ref 70–99)

## 2020-03-20 SURGERY — ARTHROSCOPY, KNEE, WITH MEDIAL MENISCECTOMY
Anesthesia: General | Site: Knee | Laterality: Left

## 2020-03-20 MED ORDER — OXYCODONE HCL 5 MG/5ML PO SOLN
5.0000 mg | Freq: Once | ORAL | Status: AC | PRN
  Administered 2020-03-20: 5 mg via ORAL

## 2020-03-20 MED ORDER — GLYCOPYRROLATE 0.2 MG/ML IJ SOLN
INTRAMUSCULAR | Status: DC | PRN
  Administered 2020-03-20: .2 mg via INTRAVENOUS

## 2020-03-20 MED ORDER — HYDROMORPHONE HCL 1 MG/ML IJ SOLN
0.2500 mg | INTRAMUSCULAR | Status: DC | PRN
  Administered 2020-03-20: 0.3 mg via INTRAVENOUS

## 2020-03-20 MED ORDER — LIDOCAINE HCL (CARDIAC) PF 100 MG/5ML IV SOSY
PREFILLED_SYRINGE | INTRAVENOUS | Status: DC | PRN
  Administered 2020-03-20: 50 mg via INTRATRACHEAL

## 2020-03-20 MED ORDER — ACETAMINOPHEN 500 MG PO TABS: 1000.0000 mg | ORAL_TABLET | Freq: Three times a day (TID) | ORAL | 2 refills | Status: AC

## 2020-03-20 MED ORDER — IBUPROFEN 800 MG PO TABS
800.0000 mg | ORAL_TABLET | Freq: Three times a day (TID) | ORAL | 1 refills | Status: AC
Start: 2020-03-20 — End: 2020-04-03

## 2020-03-20 MED ORDER — LIDOCAINE-EPINEPHRINE 1 %-1:100000 IJ SOLN
INTRAMUSCULAR | Status: DC | PRN
  Administered 2020-03-20: 2 mL via INTRAMUSCULAR
  Administered 2020-03-20: 12 mL via INTRAMUSCULAR

## 2020-03-20 MED ORDER — SODIUM CHLORIDE 0.9 % IV SOLN: 800.0000 mg | Freq: Once | INTRAVENOUS | Status: DC

## 2020-03-20 MED ORDER — FENTANYL CITRATE (PF) 100 MCG/2ML IJ SOLN
INTRAMUSCULAR | Status: DC | PRN
  Administered 2020-03-20 (×2): 25 ug via INTRAVENOUS
  Administered 2020-03-20: 50 ug via INTRAVENOUS

## 2020-03-20 MED ORDER — PROMETHAZINE HCL 25 MG/ML IJ SOLN: 6.2500 mg | INTRAMUSCULAR | Status: DC | PRN

## 2020-03-20 MED ORDER — CEFAZOLIN SODIUM-DEXTROSE 2-4 GM/100ML-% IV SOLN
2.0000 g | INTRAVENOUS | Status: AC
  Administered 2020-03-20: 2 g via INTRAVENOUS

## 2020-03-20 MED ORDER — HYDROCODONE-ACETAMINOPHEN 5-325 MG PO TABS: 1.0000 | ORAL_TABLET | ORAL | 0 refills | Status: DC | PRN

## 2020-03-20 MED ORDER — MIDAZOLAM HCL 5 MG/5ML IJ SOLN
INTRAMUSCULAR | Status: DC | PRN
  Administered 2020-03-20: 2 mg via INTRAVENOUS

## 2020-03-20 MED ORDER — ACETAMINOPHEN 10 MG/ML IV SOLN
INTRAVENOUS | Status: DC | PRN
Start: 2020-03-20 — End: 2020-03-20
  Administered 2020-03-20: 1000 mg via INTRAVENOUS

## 2020-03-20 MED ORDER — DEXMEDETOMIDINE HCL 200 MCG/2ML IV SOLN
INTRAVENOUS | Status: DC | PRN
  Administered 2020-03-20: 5 ug via INTRAVENOUS

## 2020-03-20 MED ORDER — LACTATED RINGERS IV SOLN: INTRAVENOUS | Status: DC

## 2020-03-20 MED ORDER — ONDANSETRON 4 MG PO TBDP
4.0000 mg | ORAL_TABLET | Freq: Three times a day (TID) | ORAL | 0 refills | Status: DC | PRN
Start: 2020-03-20 — End: 2022-09-21

## 2020-03-20 MED ORDER — OXYCODONE HCL 5 MG PO TABS: 5.0000 mg | ORAL_TABLET | Freq: Once | ORAL | Status: AC | PRN

## 2020-03-20 MED ORDER — ASPIRIN EC 325 MG PO TBEC
325.0000 mg | DELAYED_RELEASE_TABLET | Freq: Every day | ORAL | 0 refills | Status: AC
Start: 2020-03-20 — End: 2020-04-03

## 2020-03-20 MED ORDER — PROPOFOL 10 MG/ML IV BOLUS
INTRAVENOUS | Status: DC | PRN
  Administered 2020-03-20: 100 mg via INTRAVENOUS

## 2020-03-20 MED ORDER — KETOROLAC TROMETHAMINE 30 MG/ML IJ SOLN
INTRAMUSCULAR | Status: DC | PRN
  Administered 2020-03-20: 30 mg via INTRAVENOUS

## 2020-03-20 MED ORDER — ONDANSETRON HCL 4 MG/2ML IJ SOLN
INTRAMUSCULAR | Status: DC | PRN
  Administered 2020-03-20: 4 mg via INTRAVENOUS

## 2020-03-20 SURGICAL SUPPLY — 37 items
ADAPTER IRRIG TUBE 2 SPIKE SOL (ADAPTER) ×6 IMPLANT
BLADE SURG SZ11 CARB STEEL (BLADE) ×3 IMPLANT
BNDG COHESIVE 4X5 TAN STRL (GAUZE/BANDAGES/DRESSINGS) ×3 IMPLANT
BNDG ESMARK 6X12 TAN STRL LF (GAUZE/BANDAGES/DRESSINGS) ×3 IMPLANT
BUR RADIUS 3.5 (BURR) ×3 IMPLANT
BUR RADIUS 4.0X18.5 (BURR) IMPLANT
CHLORAPREP W/TINT 26 (MISCELLANEOUS) ×3 IMPLANT
COOLER POLAR GLACIER W/PUMP (MISCELLANEOUS) ×3 IMPLANT
COVER LIGHT HANDLE UNIVERSAL (MISCELLANEOUS) ×6 IMPLANT
CUFF TOURN SGL QUICK 30 (TOURNIQUET CUFF) ×2
CUFF TRNQT CYL 30X4X21-28X (TOURNIQUET CUFF) ×1 IMPLANT
DRAPE IMP U-DRAPE 54X76 (DRAPES) ×3 IMPLANT
GAUZE SPONGE 4X4 12PLY STRL (GAUZE/BANDAGES/DRESSINGS) ×3 IMPLANT
GLOVE BIO SURGEON STRL SZ7.5 (GLOVE) ×9 IMPLANT
GLOVE BIOGEL PI IND STRL 8 (GLOVE) ×2 IMPLANT
GLOVE BIOGEL PI INDICATOR 8 (GLOVE) ×4
GOWN STRL REUS W/ TWL LRG LVL3 (GOWN DISPOSABLE) ×1 IMPLANT
GOWN STRL REUS W/TWL LRG LVL3 (GOWN DISPOSABLE) ×2
GOWN STRL REUS W/TWL XL LVL3 (GOWN DISPOSABLE) ×3 IMPLANT
IV LACTATED RINGER IRRG 3000ML (IV SOLUTION) ×8
IV LR IRRIG 3000ML ARTHROMATIC (IV SOLUTION) ×4 IMPLANT
KIT TURNOVER KIT A (KITS) ×3 IMPLANT
MANIFOLD NEPTUNE II (INSTRUMENTS) ×3 IMPLANT
MAT ABSORB  FLUID 56X50 GRAY (MISCELLANEOUS) ×2
MAT ABSORB FLUID 56X50 GRAY (MISCELLANEOUS) ×1 IMPLANT
PACK ARTHROSCOPY KNEE (MISCELLANEOUS) ×3 IMPLANT
PAD WRAPON POLAR KNEE (MISCELLANEOUS) ×1 IMPLANT
PADDING CAST BLEND 6X4 STRL (MISCELLANEOUS) ×1 IMPLANT
PADDING STRL CAST 6IN (MISCELLANEOUS) ×2
SET TUBE SUCT SHAVER OUTFL 24K (TUBING) ×3 IMPLANT
SET TUBE TIP INTRA-ARTICULAR (MISCELLANEOUS) ×3 IMPLANT
SUT ETHILON 3-0 FS-10 30 BLK (SUTURE) ×3
SUTURE EHLN 3-0 FS-10 30 BLK (SUTURE) ×1 IMPLANT
TOWEL OR 17X26 4PK STRL BLUE (TOWEL DISPOSABLE) ×6 IMPLANT
TUBING ARTHRO INFLOW-ONLY STRL (TUBING) ×3 IMPLANT
WAND WEREWOLF FLOW 90D (MISCELLANEOUS) IMPLANT
WRAPON POLAR PAD KNEE (MISCELLANEOUS) ×3

## 2020-03-20 NOTE — Anesthesia Procedure Notes (Signed)
Procedure Name: LMA Insertion Date/Time: 03/20/2020 10:29 AM Performed by: Jeannene Patella, CRNA Pre-anesthesia Checklist: Emergency Drugs available, Patient identified, Suction available, Patient being monitored and Timeout performed Patient Re-evaluated:Patient Re-evaluated prior to induction Oxygen Delivery Method: Circle system utilized Preoxygenation: Pre-oxygenation with 100% oxygen Induction Type: IV induction Ventilation: Mask ventilation without difficulty LMA: LMA inserted LMA Size: 4.0 Number of attempts: 1 Placement Confirmation: ETT inserted through vocal cords under direct vision,  positive ETCO2,  CO2 detector and breath sounds checked- equal and bilateral Tube secured with: Tape Dental Injury: Teeth and Oropharynx as per pre-operative assessment

## 2020-03-20 NOTE — Transfer of Care (Signed)
Immediate Anesthesia Transfer of Care Note  Patient: Samantha Welch  Procedure(s) Performed: knee arthroscopic partial medial AND lateral meniscectomy (Left Knee)  Patient Location: PACU  Anesthesia Type: General  Level of Consciousness: awake, alert  and patient cooperative  Airway and Oxygen Therapy: Patient Spontanous Breathing and Patient connected to supplemental oxygen  Post-op Assessment: Post-op Vital signs reviewed, Patient's Cardiovascular Status Stable, Respiratory Function Stable, Patent Airway and No signs of Nausea or vomiting  Post-op Vital Signs: Reviewed and stable  Complications: No complications documented.

## 2020-03-20 NOTE — Discharge Instructions (Signed)
Arthroscopic Knee Surgery - Partial Meniscectomy   Post-Op Instructions   1. Bracing or crutches: Crutches will be provided at the time of discharge from the surgery center if you do not already have them.   2. Ice: You may be provided with a device (Polar Care) that allows you to ice the affected area effectively. Otherwise you can ice manually.    3. Driving:  Plan on not driving for at least two weeks. Please note that you are advised NOT to drive while taking narcotic pain medications as you may be impaired and unsafe to drive.   4. Activity: Ankle pumps several times an hour while awake to prevent blood clots. Weight bearing: as tolerated. Use crutches for as needed (usually ~1 week or less) until pain allows you to ambulate without a limp. Bending and straightening the knee is unlimited. Elevate knee above heart level as much as possible for one week. Avoid standing more than 5 minutes (consecutively) for the first week.  Avoid long distance travel for 2 weeks.  5. Medications:  - You have been provided a prescription for narcotic pain medicine. After surgery, take 1-2 narcotic tablets every 4 hours if needed for severe pain.  - You may take up to 3000mg/day of tylenol (acetaminophen). You can take 1000mg 3x/day. Please check your narcotic. If you have acetaminophen in your narcotic (each tablet will be 325mg), be careful not to exceed a total of 3000mg/day of acetaminophen.  - A prescription for anti-nausea medication will be provided in case the narcotic medicine or anesthesia causes nausea - take 1 tablet every 6 hours only if nauseated.  - Take ibuprofen 800 mg every 8 hours WITH food to reduce post-operative knee swelling. DO NOT STOP IBUPROFEN POST-OP UNTIL INSTRUCTED TO DO SO at first post-op office visit (10-14 days after surgery). However, please discontinue if you have any abdominal discomfort after taking this.  - Take enteric coated aspirin 325 mg once daily for 2 weeks to prevent  blood clots.    6. Bandages: The physical therapist should change the bandages at the first post-op appointment. If needed, the dressing supplies have been provided to you.   7. Physical Therapy: 1-2 times per week for 6 weeks. Therapy typically starts on post operative Day 3 or 4. You have been provided an order for physical therapy. The therapist will provide home exercises.   8. Work: May return to full work usually around 2 weeks after 1st post-operative visit. May do light duty/desk job in approximately 1-2 weeks when off of narcotics, pain is well-controlled, and swelling has decreased. Labor intensive jobs may require 4-6 weeks to return.      9. Post-Op Appointments: Your first post-op appointment will be with Dr. Bertine Schlottman in approximately 2 weeks time.    If you find that they have not been scheduled please call the Orthopaedic Appointment front desk at [phone].     General Anesthesia, Adult, Care After This sheet gives you information about how to care for yourself after your procedure. Your health care provider may also give you more specific instructions. If you have problems or questions, contact your health care provider. What can I expect after the procedure? After the procedure, the following side effects are common:  Pain or discomfort at the IV site.  Nausea.  Vomiting.  Sore throat.  Trouble concentrating.  Feeling cold or chills.  Weak or tired.  Sleepiness and fatigue.  Soreness and body aches. These side effects can affect parts   of the body that were not involved in surgery. Follow these instructions at home:  For at least 24 hours after the procedure:  Have a responsible adult stay with you. It is important to have someone help care for you until you are awake and alert.  Rest as needed.  Do not: ? Participate in activities in which you could fall or become injured. ? Drive. ? Use heavy machinery. ? Drink alcohol. ? Take sleeping pills or  medicines that cause drowsiness. ? Make important decisions or sign legal documents. ? Take care of children on your own. Eating and drinking  Follow any instructions from your health care provider about eating or drinking restrictions.  When you feel hungry, start by eating small amounts of foods that are soft and easy to digest (bland), such as toast. Gradually return to your regular diet.  Drink enough fluid to keep your urine pale yellow.  If you vomit, rehydrate by drinking water, juice, or clear broth. General instructions  If you have sleep apnea, surgery and certain medicines can increase your risk for breathing problems. Follow instructions from your health care provider about wearing your sleep device: ? Anytime you are sleeping, including during daytime naps. ? While taking prescription pain medicines, sleeping medicines, or medicines that make you drowsy.  Return to your normal activities as told by your health care provider. Ask your health care provider what activities are safe for you.  Take over-the-counter and prescription medicines only as told by your health care provider.  If you smoke, do not smoke without supervision.  Keep all follow-up visits as told by your health care provider. This is important. Contact a health care provider if:  You have nausea or vomiting that does not get better with medicine.  You cannot eat or drink without vomiting.  You have pain that does not get better with medicine.  You are unable to pass urine.  You develop a skin rash.  You have a fever.  You have redness around your IV site that gets worse. Get help right away if:  You have difficulty breathing.  You have chest pain.  You have blood in your urine or stool, or you vomit blood. Summary  After the procedure, it is common to have a sore throat or nausea. It is also common to feel tired.  Have a responsible adult stay with you for the first 24 hours after general  anesthesia. It is important to have someone help care for you until you are awake and alert.  When you feel hungry, start by eating small amounts of foods that are soft and easy to digest (bland), such as toast. Gradually return to your regular diet.  Drink enough fluid to keep your urine pale yellow.  Return to your normal activities as told by your health care provider. Ask your health care provider what activities are safe for you. This information is not intended to replace advice given to you by your health care provider. Make sure you discuss any questions you have with your health care provider. Document Revised: 09/02/2017 Document Reviewed: 04/15/2017 Elsevier Patient Education  2020 [employer]  

## 2020-03-20 NOTE — Anesthesia Postprocedure Evaluation (Signed)
Anesthesia Post Note  Patient: Samantha Welch  Procedure(s) Performed: knee arthroscopic partial medial AND lateral meniscectomy (Left Knee)     Patient location during evaluation: PACU Anesthesia Type: General Level of consciousness: awake and alert Pain management: pain level controlled Vital Signs Assessment: post-procedure vital signs reviewed and stable Respiratory status: spontaneous breathing, nonlabored ventilation, respiratory function stable and patient connected to nasal cannula oxygen Cardiovascular status: blood pressure returned to baseline and stable Postop Assessment: no apparent nausea or vomiting Anesthetic complications: no   No complications documented.  Yiselle Babich A  Thereasa Iannello

## 2020-03-20 NOTE — H&P (Signed)
Paper H&P to be scanned into permanent record. H&P reviewed. No significant changes noted.  

## 2020-03-20 NOTE — Op Note (Signed)
Operative Note    SURGERY DATE: 03/20/2020   PRE-OP DIAGNOSIS:  1. Left medial meniscus tear 2. Left materal meniscus tear   POST-OP DIAGNOSIS:  1. Left medial meniscus tear 2. Left materal meniscus tear   PROCEDURES:  1.  Left knee arthroscopy, partial medial AND lateral meniscectomy   SURGEON: Cato Mulligan, MD   ANESTHESIA: Gen   ESTIMATED BLOOD LOSS: minimal   TOTAL IV FLUIDS: per anesthesia   INDICATION(S):  Samantha Welch is a 69 y.o. female with signs and symptoms as well as MRI finding of medial meniscus tear.  There were no significant degenerative changes on preoperative imaging.  After discussion of risks, benefits, and alternatives to surgery, the patient elected to proceed.   OPERATIVE FINDINGS:    Examination under anesthesia: A careful examination under anesthesia was performed.  Passive range of motion was: Hyperextension: 1.  Extension: 0.  Flexion: 130.  Lachman: normal. Pivot Shift: normal.  Posterior drawer: normal.  Varus stability in full extension: normal.  Varus stability in 30 degrees of flexion: normal.  Valgus stability in full extension: normal.  Valgus stability in 30 degrees of flexion: normal.   Intra-operative findings: A thorough arthroscopic examination of the knee was performed.  The findings are: 1. Suprapatellar pouch: Normal 2. Undersurface of median ridge: Grade 1 softening 3. Medial patellar facet: Grade 1 softening 4. Lateral patellar facet: Grade 1 softening 5. Trochlea: Focal grade 2-3 degenerative changes centrally 6. Lateral gutter/popliteus tendon: Normal 7. Hoffa's fat pad: Inflamed 8. Medial gutter/plica: Normal 9. ACL: Normal 10. PCL: Normal 11. Medial meniscus: Complex tear with horizontal and radial tear patterns affecting ~80% of meniscus width of the posterior horn and extending to the posterior horn/body junction 12. Medial compartment cartilage: focal areas of Grade 1-2 degenerative changes to the medial femoral condyle and  tibial plateau 13. Lateral meniscus: radial tear at body affecting ~30% meniscus width 14. Lateral compartment cartilage: focal areas of Grade 1-2 degenerative changes to the medial femoral condyle and tibial plateau   OPERATIVE REPORT:     I identified Kris Mouton in the pre-operative holding area. I marked the operative knee with my initials. I reviewed the risks and benefits of the proposed surgical intervention and the patient wished to proceed. The patient was transferred to the operative suite and placed in the supine position with all bony prominences padded.  Anesthesia was administered. Appropriate IV antibiotics were administered prior to incision. The extremity was then prepped and draped in standard fashion. A time out was performed confirming the correct extremity, correct patient, and correct procedure.   Arthroscopy portals were marked. Local anesthetic was injected to the planned portal sites. The anterolateral portal was established with an 11 blade.      The arthroscope was placed in the anterolateral portal and then into the suprapatellar pouch. Next, the medial portal was established under needle localization. A diagnostic knee scope was completed with the above findings. The lateral meniscus tear was identified. The meniscus tear was debrided using an arthroscopic biter and an oscillating shaver until the meniscus had stable borders.     Next, the medial meniscus tear was identified.  The MCL was pie-crusted to improve visualization of the posterior horn. The meniscal tear was debrided using an arthroscopic biter and an oscillating shaver until the meniscus had stable borders.  It was stable to probing. Arthroscopic fluid was removed from the joint.   The portals were closed with 3-0 Nylon suture. Sterile dressings included  Xeroform, 4x4s, Sof-Rol, and Bias wrap. A Polarcare was placed.  The patient was then awakened and taken to the PACU hemodynamically stable without  complication.   POSTOPERATIVE PLAN: The patient will be discharged home today once they meet PACU criteria. Aspirin 325 mg daily was prescribed for 2 weeks for DVT prophylaxis.  Physical therapy will start on POD#3-4. Weight-bearing as tolerated. Follow up in 2 weeks per protocol.

## 2020-03-21 ENCOUNTER — Encounter: Payer: Self-pay | Admitting: Orthopedic Surgery

## 2020-03-21 NOTE — Progress Notes (Signed)
Pt rating postop pain 9/10. She verbalizes she last took hydrodone at 0500, advised her it is prescribed every 4 hrs PRN for severe pain, so she could have taken another dose at 0900; inquired w/ pt whether she has started prescribed tylenol & ibuprofen yet, she denies have started either & verbalizes she has been told previously be another MD not to take ibuprofen. Instructed her to take hydrocodone every 4 hrs PRN as prescribed, start taking tylenol every 8 hrs as prescribed, and to keep knee elevated above heart level as well as use polar care PRN, and if that does not bring her pain to a tolerable to call Dr. Serita Grit office, she verbalizes understanding.

## 2021-02-26 ENCOUNTER — Other Ambulatory Visit: Payer: Self-pay | Admitting: Family

## 2021-02-26 DIAGNOSIS — Z1231 Encounter for screening mammogram for malignant neoplasm of breast: Secondary | ICD-10-CM

## 2021-03-03 ENCOUNTER — Other Ambulatory Visit: Payer: Self-pay

## 2021-03-03 ENCOUNTER — Ambulatory Visit
Admission: RE | Admit: 2021-03-03 | Discharge: 2021-03-03 | Disposition: A | Discharge status: Home / Self Care | Payer: Medicare Other | Source: Ambulatory Visit | Attending: Family | Admitting: Family

## 2021-03-03 DIAGNOSIS — Z1231 Encounter for screening mammogram for malignant neoplasm of breast: Secondary | ICD-10-CM | POA: Diagnosis not present

## 2021-03-04 ENCOUNTER — Other Ambulatory Visit: Payer: Self-pay | Admitting: *Deleted

## 2021-03-04 ENCOUNTER — Inpatient Hospital Stay
Admission: RE | Admit: 2021-03-04 | Discharge: 2021-03-04 | Disposition: A | Discharge status: Home / Self Care | Payer: Self-pay | Source: Ambulatory Visit | Attending: *Deleted | Admitting: *Deleted

## 2021-03-04 DIAGNOSIS — Z1231 Encounter for screening mammogram for malignant neoplasm of breast: Secondary | ICD-10-CM

## 2021-03-17 DIAGNOSIS — Z720 Tobacco use: Secondary | ICD-10-CM | POA: Insufficient documentation

## 2021-07-03 ENCOUNTER — Other Ambulatory Visit: Payer: Self-pay | Admitting: Gastroenterology

## 2021-07-03 DIAGNOSIS — R1032 Left lower quadrant pain: Secondary | ICD-10-CM

## 2021-07-27 ENCOUNTER — Other Ambulatory Visit: Payer: Self-pay

## 2021-07-27 ENCOUNTER — Ambulatory Visit
Admission: RE | Admit: 2021-07-27 | Discharge: 2021-07-27 | Disposition: A | Discharge status: Home / Self Care | Payer: Medicare Other | Source: Ambulatory Visit | Attending: Gastroenterology | Admitting: Gastroenterology

## 2021-07-27 DIAGNOSIS — R1032 Left lower quadrant pain: Secondary | ICD-10-CM | POA: Diagnosis not present

## 2021-07-27 MED ORDER — IOHEXOL 300 MG/ML  SOLN
100.0000 mL | Freq: Once | INTRAMUSCULAR | Status: AC | PRN
  Administered 2021-07-27: 100 mL via INTRAVENOUS

## 2021-10-02 ENCOUNTER — Encounter: Payer: Self-pay | Admitting: Gastroenterology

## 2021-10-04 ENCOUNTER — Encounter: Payer: Self-pay | Admitting: Gastroenterology

## 2021-10-04 NOTE — H&P (Signed)
Pre-Procedure H&P   Patient ID: Samantha Welch is a 71 y.o. female.  Gastroenterology Provider: Annamaria Helling, DO  Referring Provider: Stephens November, NP PCP: Verita Lamb, NP  Date: 10/05/2021  HPI Samantha Welch is a 71 y.o. female who presents today for Colonoscopy for Surveillance-personal history of colon polyps, family history of colon cancer (brother). Patient with left lower quadrant pain and constipation.  No relief in pain with bowel movement. No hematochezia/melena. Underwent CT scan in November 2022 with mild constipation, otherwise unremarkable.  Last reported hemoglobin 14.2 MCV 93 platelets 299,000 creatinine 0.9. Last colonoscopy with Dr. Domenica Fail at [phone] with adenomatous polyp Brother with a history of colon cancer. No other acute GI complaints   Past Medical History:  Diagnosis Date   Anxiety    Arthritis    Cancer ([place])    skin ca on face   Chronic kidney disease    Left kidney stone   COPD (chronic obstructive pulmonary disease) (HCC)    Depression    Diabetes mellitus without complication (HCC)    GERD (gastroesophageal reflux disease)    Hyperlipemia    Stomach ulcer    Tobacco abuse disorder     Past Surgical History:  Procedure Laterality Date   CYSTOSCOPY W/ URETERAL STENT PLACEMENT Left 12/15/2015   Procedure: CYSTOSCOPY WITH STENT REPLACEMENT;  Surgeon: Hollice Espy, MD;  Location: [facility];  Service: Urology;  Laterality: Left;   CYSTOSCOPY WITH STENT PLACEMENT Left 11/16/2015   Procedure: CYSTOSCOPY WITH LEFT STENT PLACEMENT, LEFT RETROGRADE;  Surgeon: Dereck Leep, MD;  Location: [facility];  Service: Urology;  Laterality: Left;   FOOT SURGERY Bilateral    KNEE ARTHROSCOPY WITH MEDIAL MENISECTOMY Left 03/20/2020   Procedure: knee arthroscopic partial medial AND lateral meniscectomy;  Surgeon: Leim Fabry, MD;  Location: [place];  Service: Orthopedics;  Laterality: Left;  Diabetic - oral meds   TUBAL  LIGATION     URETEROSCOPY WITH HOLMIUM LASER LITHOTRIPSY Left 12/15/2015   Procedure: URETEROSCOPY WITH HOLMIUM LASER LITHOTRIPSY;  Surgeon: Hollice Espy, MD;  Location: [facility];  Service: Urology;  Laterality: Left;    Family History Father with colon cancer No other h/o GI disease or malignancy  Review of Systems  Constitutional:  Negative for activity change, appetite change, chills, diaphoresis, fatigue, fever and unexpected weight change.  HENT:  Negative for trouble swallowing and voice change.   Respiratory:  Negative for shortness of breath and wheezing.   Cardiovascular:  Negative for chest pain, palpitations and leg swelling.  Gastrointestinal:  Positive for abdominal pain (llq) and constipation. Negative for abdominal distention, anal bleeding, blood in stool, diarrhea, nausea, rectal pain and vomiting.  Musculoskeletal:  Negative for arthralgias and myalgias.  Skin:  Negative for color change and pallor.  Neurological:  Negative for dizziness, syncope and weakness.  Psychiatric/Behavioral:  Negative for confusion.   All other systems reviewed and are negative.   Medications No current facility-administered medications on file prior to encounter.   Current Outpatient Medications on File Prior to Encounter  Medication Sig Dispense Refill   aspirin 81 MG chewable tablet Chew 81 mg by mouth daily.     buPROPion (WELLBUTRIN XL) 150 MG 24 hr tablet Take 150 mg by mouth daily.     cephALEXin (KEFLEX) 250 MG capsule Take by mouth daily at 8 pm.     clonazePAM (KLONOPIN) 1 MG tablet Take 1 mg by mouth 2 (two) times daily.     dicyclomine (BENTYL)  10 MG capsule Take 10 mg by mouth 4 (four) times daily -  before meals and at bedtime.     HYDROcodone-acetaminophen (NORCO) 5-325 MG tablet Take 1-2 tablets by mouth every 4 (four) hours as needed for moderate pain or severe pain. 10 tablet 0   hydrOXYzine (ATARAX/VISTARIL) 25 MG tablet Take 25 mg by mouth at bedtime as needed.      meloxicam (MOBIC) 15 MG tablet Take 15 mg by mouth daily.     metFORMIN (GLUCOPHAGE) 1000 MG tablet Take 500 mg by mouth every morning.      nitrofurantoin (MACRODANTIN) 100 MG capsule Take 100 mg by mouth daily.     Omega-3 Fatty Acids (FISH OIL OMEGA-3) 1000 MG CAPS Take 2,000 mg by mouth.     ondansetron (ZOFRAN ODT) 4 MG disintegrating tablet Take 1 tablet (4 mg total) by mouth every 8 (eight) hours as needed for nausea or vomiting. 20 tablet 0   pantoprazole (PROTONIX) 40 MG tablet Take 40 mg by mouth at bedtime.     polyethylene glycol (MIRALAX / GLYCOLAX) 17 g packet Take 17 g by mouth daily.     pravastatin (PRAVACHOL) 20 MG tablet Take 20 mg by mouth at bedtime.     promethazine (PHENERGAN) 25 MG tablet Take 25 mg by mouth every 6 (six) hours as needed for nausea or vomiting.     tiotropium (SPIRIVA) 18 MCG inhalation capsule Place 18 mcg into inhaler and inhale daily.     traMADol (ULTRAM) 50 MG tablet Take by mouth every 6 (six) hours as needed.      Pertinent medications related to GI and procedure were reviewed by me with the patient prior to the procedure   Current Facility-Administered Medications:    0.9 %  sodium chloride infusion, , Intravenous, Continuous, Annamaria Helling, DO, Last Rate: 20 mL/hr at 10/05/21 0820, Continued from Pre-op at 10/05/21 0820      No Known Allergies Allergies were reviewed by me prior to the procedure  Objective    Vitals:   10/05/21 0754  BP: (!) 157/68  Pulse: (!) 56  Resp: 20  Temp: (!) 96 F (35.6 C)  TempSrc: Temporal  SpO2: 100%  Weight: 65.8 kg  Height: 5\' 2"  (1.575 m)     Physical Exam Vitals and nursing note reviewed.  Constitutional:      General: She is not in acute distress.    Appearance: Normal appearance. She is not ill-appearing, toxic-appearing or diaphoretic.  HENT:     Head: Normocephalic and atraumatic.     Nose: Nose normal.     Mouth/Throat:     Mouth: Mucous membranes are moist.     Pharynx:  Oropharynx is clear.  Eyes:     General: No scleral icterus.    Extraocular Movements: Extraocular movements intact.  Cardiovascular:     Rate and Rhythm: Regular rhythm.     Heart sounds: Normal heart sounds. No murmur heard.   No friction rub. No gallop.  Pulmonary:     Effort: Pulmonary effort is normal. No respiratory distress.     Breath sounds: Normal breath sounds. No wheezing, rhonchi or rales.  Abdominal:     General: Bowel sounds are normal. There is no distension.     Palpations: Abdomen is soft.     Tenderness: There is abdominal tenderness (llq). There is no guarding or rebound.  Musculoskeletal:     Cervical back: Neck supple.     Right lower leg: No edema.  Left lower leg: No edema.  Skin:    General: Skin is warm and dry.     Coloration: Skin is not jaundiced or pale.  Neurological:     General: No focal deficit present.     Mental Status: She is alert and oriented to person, place, and time. Mental status is at baseline.  Psychiatric:        Mood and Affect: Mood normal.        Behavior: Behavior normal.        Thought Content: Thought content normal.        Judgment: Judgment normal.     Assessment:  Samantha Welch is a 71 y.o. female  who presents today for Colonoscopy for Surveillance-personal history of colon polyps, family history of colon cancer (brother).  Plan:  Colonoscopy with possible intervention today  Colonoscopy with possible biopsy, control of bleeding, polypectomy, and interventions as necessary has been discussed with the patient/patient representative. Informed consent was obtained from the patient/patient representative after explaining the indication, nature, and risks of the procedure including but not limited to death, bleeding, perforation, missed neoplasm/lesions, cardiorespiratory compromise, and reaction to medications. Opportunity for questions was given and appropriate answers were provided. Patient/patient representative has  verbalized understanding is amenable to undergoing the procedure.   Annamaria Helling, DO  [facility]  Portions of the record may have been created with voice recognition software. Occasional wrong-word or 'sound-a-like' substitutions may have occurred due to the inherent limitations of voice recognition software.  Read the chart carefully and recognize, using context, where substitutions may have occurred.

## 2021-10-05 ENCOUNTER — Ambulatory Visit: Payer: Medicare Other | Admitting: Anesthesiology

## 2021-10-05 ENCOUNTER — Encounter: Payer: Self-pay | Admitting: Gastroenterology

## 2021-10-05 ENCOUNTER — Ambulatory Visit
Admission: RE | Admit: 2021-10-05 | Discharge: 2021-10-05 | Disposition: A | Discharge status: Home / Self Care | Payer: Medicare Other | Attending: Gastroenterology | Admitting: Gastroenterology

## 2021-10-05 ENCOUNTER — Encounter
Admission: RE | Disposition: A | Discharge status: Home / Self Care | Payer: Self-pay | Source: Home / Self Care | Attending: Gastroenterology

## 2021-10-05 DIAGNOSIS — Z1211 Encounter for screening for malignant neoplasm of colon: Secondary | ICD-10-CM | POA: Diagnosis present

## 2021-10-05 DIAGNOSIS — Z8711 Personal history of peptic ulcer disease: Secondary | ICD-10-CM | POA: Diagnosis not present

## 2021-10-05 DIAGNOSIS — M1990 Unspecified osteoarthritis, unspecified site: Secondary | ICD-10-CM | POA: Insufficient documentation

## 2021-10-05 DIAGNOSIS — Z800 Family history of malignant neoplasm of digestive organs: Secondary | ICD-10-CM | POA: Diagnosis not present

## 2021-10-05 DIAGNOSIS — K635 Polyp of colon: Secondary | ICD-10-CM | POA: Diagnosis not present

## 2021-10-05 DIAGNOSIS — K219 Gastro-esophageal reflux disease without esophagitis: Secondary | ICD-10-CM | POA: Diagnosis not present

## 2021-10-05 DIAGNOSIS — D120 Benign neoplasm of cecum: Secondary | ICD-10-CM | POA: Diagnosis not present

## 2021-10-05 DIAGNOSIS — J449 Chronic obstructive pulmonary disease, unspecified: Secondary | ICD-10-CM | POA: Diagnosis not present

## 2021-10-05 DIAGNOSIS — E119 Type 2 diabetes mellitus without complications: Secondary | ICD-10-CM | POA: Insufficient documentation

## 2021-10-05 DIAGNOSIS — F32A Depression, unspecified: Secondary | ICD-10-CM | POA: Diagnosis not present

## 2021-10-05 DIAGNOSIS — D122 Benign neoplasm of ascending colon: Secondary | ICD-10-CM | POA: Insufficient documentation

## 2021-10-05 DIAGNOSIS — K644 Residual hemorrhoidal skin tags: Secondary | ICD-10-CM | POA: Diagnosis not present

## 2021-10-05 DIAGNOSIS — K648 Other hemorrhoids: Secondary | ICD-10-CM | POA: Diagnosis not present

## 2021-10-05 DIAGNOSIS — K589 Irritable bowel syndrome without diarrhea: Secondary | ICD-10-CM | POA: Insufficient documentation

## 2021-10-05 DIAGNOSIS — F419 Anxiety disorder, unspecified: Secondary | ICD-10-CM | POA: Insufficient documentation

## 2021-10-05 HISTORY — PX: COLONOSCOPY WITH PROPOFOL: SHX5780

## 2021-10-05 LAB — GLUCOSE, CAPILLARY: Glucose-Capillary: 128 mg/dL — ABNORMAL HIGH (ref 70–99)

## 2021-10-05 SURGERY — COLONOSCOPY WITH PROPOFOL
Anesthesia: General

## 2021-10-05 MED ORDER — LIDOCAINE HCL (CARDIAC) PF 100 MG/5ML IV SOSY
PREFILLED_SYRINGE | INTRAVENOUS | Status: DC | PRN
  Administered 2021-10-05: 50 mg via INTRAVENOUS

## 2021-10-05 MED ORDER — LABETALOL HCL 5 MG/ML IV SOLN
INTRAVENOUS | Status: DC | PRN
  Administered 2021-10-05 (×2): 5 mg via INTRAVENOUS

## 2021-10-05 MED ORDER — SODIUM CHLORIDE 0.9 % IV SOLN
INTRAVENOUS | Status: DC
  Administered 2021-10-05: 20 mL/h via INTRAVENOUS

## 2021-10-05 MED ORDER — PROPOFOL 500 MG/50ML IV EMUL
INTRAVENOUS | Status: DC | PRN
  Administered 2021-10-05: 150 ug/kg/min via INTRAVENOUS

## 2021-10-05 MED ORDER — PROPOFOL 500 MG/50ML IV EMUL
INTRAVENOUS | Status: AC
  Filled 2021-10-05: qty 50

## 2021-10-05 MED ORDER — PROPOFOL 10 MG/ML IV BOLUS
INTRAVENOUS | Status: DC | PRN
  Administered 2021-10-05: 50 mg via INTRAVENOUS
  Administered 2021-10-05: 20 mg via INTRAVENOUS

## 2021-10-05 NOTE — Op Note (Signed)
[facility] Gastroenterology Patient Name: Samantha Welch Procedure Date: 10/05/2021 8:23 AM MRN: [id number] Account #: [id number] Date of Birth: 02-26-1951 Admit Type: Outpatient Age: 71 Room: [facility] ENDO ROOM 2 Gender: Female Note Status: Finalized Instrument Name: Jasper Riling [id number] Procedure:             Colonoscopy Indications:           High risk colon cancer surveillance: Personal history                         of colonic polyps, Family history of colon cancer in a                         first-degree relative before age 79 years Providers:             Rueben Bash, DO Referring MD:          Verita Lamb (Referring MD) Medicines:             Monitored Anesthesia Care Complications:         No immediate complications. Estimated blood loss:                         Minimal. Procedure:             Pre-Anesthesia Assessment:                        - Prior to the procedure, a History and Physical was                         performed, and patient medications and allergies were                         reviewed. The patient is competent. The risks and                         benefits of the procedure and the sedation options and                         risks were discussed with the patient. All questions                         were answered and informed consent was obtained.                         Patient identification and proposed procedure were                         verified by the physician, the nurse, the anesthetist                         and the technician in the endoscopy suite. Mental                         Status Examination: alert and oriented. Airway                         Examination: normal oropharyngeal airway and neck  mobility. Respiratory Examination: clear to                         auscultation. CV Examination: RRR, no murmurs, no S3                         or S4. Prophylactic Antibiotics: The patient does  not                         require prophylactic antibiotics. Prior                         Anticoagulants: The patient has taken no previous                         anticoagulant or antiplatelet agents. ASA Grade                         Assessment: II - A patient with mild systemic disease.                         After reviewing the risks and benefits, the patient                         was deemed in satisfactory condition to undergo the                         procedure. The anesthesia plan was to use monitored                         anesthesia care (MAC). Immediately prior to                         administration of medications, the patient was                         re-assessed for adequacy to receive sedatives. The                         heart rate, respiratory rate, oxygen saturations,                         blood pressure, adequacy of pulmonary ventilation, and                         response to care were monitored throughout the                         procedure. The physical status of the patient was                         re-assessed after the procedure.                        After obtaining informed consent, the colonoscope was                         passed under direct vision. Throughout the procedure,  the patient's blood pressure, pulse, and oxygen                         saturations were monitored continuously. The                         Colonoscope was introduced through the anus and                         advanced to the the cecum, identified by appendiceal                         orifice and ileocecal valve. The colonoscopy was                         performed without difficulty. The patient tolerated                         the procedure well. The quality of the bowel                         preparation was evaluated using the BBPS [facility] Bowel                         Preparation Scale) with scores of: Right Colon = 3,                          Transverse Colon = 3 and Left Colon = 3 (entire mucosa                         seen well with no residual staining, small fragments                         of stool or opaque liquid). The total BBPS score                         equals 9. The ileocecal valve, appendiceal orifice,                         and rectum were photographed. Findings:      Hemorrhoids were found on perianal exam.      The digital rectal exam was normal. Pertinent negatives include normal       sphincter tone.      Four sessile polyps were found in the transverse colon, ascending colon       and cecum (2). The polyps were 1 to 3 mm in size. These polyps were       removed with a cold biopsy forceps. Resection and retrieval were       complete. Estimated blood loss was minimal.      A 4 to 5 mm polyp was found in the ascending colon. The polyp was       sessile. The polyp was removed with a cold snare. Resection and       retrieval were complete. Estimated blood loss was minimal.      External and internal hemorrhoids were found during retroflexion.       Estimated blood loss: none.      There was mild spasm  in the entire colon. Estimated blood loss: none.      The exam was otherwise without abnormality on direct and retroflexion       views. Impression:            - Hemorrhoids found on perianal exam.                        - Four 1 to 3 mm polyps in the transverse colon, in                         the ascending colon and in the cecum(2), removed with                         a cold biopsy forceps. Resected and retrieved.                        - One 4 to 5 mm polyp in the ascending colon, removed                         with a cold snare. Resected and retrieved.                        - External and internal hemorrhoids.                        - Mild colonic spasm consistent with irritable bowel                         syndrome.                        - The examination was otherwise normal on direct and                          retroflexion views. Recommendation:        - Discharge patient to home.                        - Resume previous diet.                        - Continue present medications.                        - Await pathology results.                        - Repeat colonoscopy for surveillance based on                         pathology results.                        - Return to GI office as previously scheduled.                        - The findings and recommendations were discussed with                         the patient. Procedure Code(s):     ---  Professional ---                        [id number], Colonoscopy, flexible; with removal of                         tumor(s), polyp(s), or other lesion(s) by snare                         technique                        45380, 59, Colonoscopy, flexible; with biopsy, single                         or multiple Diagnosis Code(s):     --- Professional ---                        K63.5, Polyp of colon                        Z86.010, Personal history of colonic polyps                        K64.8, Other hemorrhoids                        K58.9, Irritable bowel syndrome without diarrhea                        Z80.0, Family history of malignant neoplasm of                         digestive organs CPT copyright 2019 American Medical Association. All rights reserved. The codes documented in this report are preliminary and upon coder review may  be revised to meet current compliance requirements. Attending Participation:      I personally performed the entire procedure. Volney American, DO Annamaria Helling DO, DO 10/05/2021 9:34:50 AM This report has been signed electronically. Number of Addenda: 0 Note Initiated On: 10/05/2021 8:23 AM Scope Withdrawal Time: 0 hours 26 minutes 34 seconds  Total Procedure Duration: 0 hours 38 minutes 33 seconds  Estimated Blood Loss:  Estimated blood loss was minimal.      [facility]

## 2021-10-05 NOTE — Transfer of Care (Signed)
Immediate Anesthesia Transfer of Care Note  Patient: Samantha Welch  Procedure(s) Performed: COLONOSCOPY WITH PROPOFOL  Patient Location: PACU and Endoscopy Unit  Anesthesia Type:General  Level of Consciousness: drowsy  Airway & Oxygen Therapy: Patient Spontanous Breathing  Post-op Assessment: Report given to RN and Post -op Vital signs reviewed and stable  Post vital signs: Reviewed and stable  Last Vitals:  Vitals Value Taken Time  BP 142/64 10/05/21 0928  Temp    Pulse 60 10/05/21 0928  Resp 17 10/05/21 0928  SpO2 100 % 10/05/21 0928  Vitals shown include unvalidated device data.  Last Pain:  Vitals:   10/05/21 0754  TempSrc: Temporal  PainSc: 9          Complications: No notable events documented.

## 2021-10-05 NOTE — Anesthesia Postprocedure Evaluation (Signed)
Anesthesia Post Note  Patient: Samantha Welch  Procedure(s) Performed: COLONOSCOPY WITH PROPOFOL  Patient location during evaluation: PACU Anesthesia Type: General Level of consciousness: awake and alert, oriented and patient cooperative Pain management: pain level controlled Vital Signs Assessment: post-procedure vital signs reviewed and stable Respiratory status: spontaneous breathing, nonlabored ventilation and respiratory function stable Cardiovascular status: blood pressure returned to baseline and stable Postop Assessment: adequate PO intake Anesthetic complications: no   No notable events documented.   Last Vitals:  Vitals:   10/05/21 0948 10/05/21 0958  BP: (!) 142/79 120/68  Pulse: 60 61  Resp: 19 19  Temp:    SpO2: 100% 100%    Last Pain:  Vitals:   10/05/21 0958  TempSrc:   PainSc: 0-No pain                 Darrin Nipper

## 2021-10-05 NOTE — Anesthesia Preprocedure Evaluation (Addendum)
Anesthesia Evaluation  Patient identified by MRN, date of birth, ID band Patient awake    Reviewed: Allergy & Precautions, NPO status , Patient's Chart, lab work & pertinent test results  History of Anesthesia Complications Negative for: history of anesthetic complications  Airway Mallampati: IV   Neck ROM: Full    Dental  (+)    Pulmonary COPD, Current Smoker (1/2 ppd) and Patient abstained from smoking.,    Pulmonary exam normal breath sounds clear to auscultation       Cardiovascular Exercise Tolerance: Good negative cardio ROS Normal cardiovascular exam Rhythm:Regular Rate:Normal     Neuro/Psych PSYCHIATRIC DISORDERS Anxiety Depression    GI/Hepatic PUD, GERD  ,  Endo/Other  diabetes, Type 2  Renal/GU Renal disease (nephrolithiasis)     Musculoskeletal  (+) Arthritis ,   Abdominal   Peds  Hematology negative hematology ROS (+)   Anesthesia Other Findings   Reproductive/Obstetrics                            Anesthesia Physical Anesthesia Plan  ASA: 2  Anesthesia Plan: General   Post-op Pain Management:    Induction: Intravenous  PONV Risk Score and Plan: 2 and Propofol infusion, TIVA and Treatment may vary due to age or medical condition  Airway Management Planned: Natural Airway  Additional Equipment:   Intra-op Plan:   Post-operative Plan:   Informed Consent: I have reviewed the patients History and Physical, chart, labs and discussed the procedure including the risks, benefits and alternatives for the proposed anesthesia with the patient or authorized representative who has indicated his/her understanding and acceptance.       Plan Discussed with: CRNA  Anesthesia Plan Comments: (LMA/GETA backup discussed.  Patient consented for risks of anesthesia including but not limited to:  - adverse reactions to medications - damage to eyes, teeth, lips or other oral  mucosa - nerve damage due to positioning  - sore throat or hoarseness - damage to heart, brain, nerves, lungs, other parts of body or loss of life  Informed patient about role of CRNA in peri- and intra-operative care.  Patient voiced understanding.)        Anesthesia Quick Evaluation

## 2021-10-05 NOTE — Interval H&P Note (Signed)
History and Physical Interval Note: Preprocedure H&P from 10/05/21  was reviewed and there was no interval change after seeing and examining the patient.  Written consent was obtained from the patient after discussion of risks, benefits, and alternatives. Patient has consented to proceed with Colonoscopy with possible intervention   10/05/2021 8:36 AM  Samantha Welch  has presented today for surgery, with the diagnosis of LLQ abd pain  & PH Adenomatous polyps.  The various methods of treatment have been discussed with the patient and family. After consideration of risks, benefits and other options for treatment, the patient has consented to  Procedure(s): COLONOSCOPY WITH PROPOFOL (N/A) as a surgical intervention.  The patient's history has been reviewed, patient examined, no change in status, stable for surgery.  I have reviewed the patient's chart and labs.  Questions were answered to the patient's satisfaction.     Annamaria Helling

## 2021-10-05 NOTE — Anesthesia Procedure Notes (Signed)
Procedure Name: MAC Date/Time: 10/05/2021 8:42 AM Performed by: Jerrye Noble, CRNA Pre-anesthesia Checklist: Patient identified, Emergency Drugs available, Suction available and Patient being monitored Oxygen Delivery Method: Nasal cannula

## 2021-10-06 ENCOUNTER — Encounter: Payer: Self-pay | Admitting: Gastroenterology

## 2021-10-06 LAB — SURGICAL PATHOLOGY

## 2022-01-01 LAB — MICROALBUMIN / CREATININE URINE RATIO: Microalb Creat Ratio: 4.4

## 2022-01-01 LAB — PROTEIN / CREATININE RATIO, URINE: Creatinine, Urine: 67

## 2022-05-04 LAB — LIPID PANEL
Cholesterol: 170 (ref 0–200)
HDL: 46 (ref 35–70)
LDL Cholesterol: 84
Triglycerides: 198 — AB (ref 40–160)

## 2022-05-04 LAB — HEMOGLOBIN A1C: Hemoglobin A1C: 7

## 2022-05-06 ENCOUNTER — Other Ambulatory Visit: Payer: Self-pay | Admitting: Family

## 2022-05-06 DIAGNOSIS — Z1231 Encounter for screening mammogram for malignant neoplasm of breast: Secondary | ICD-10-CM

## 2022-05-19 ENCOUNTER — Encounter: Payer: Self-pay | Admitting: Emergency Medicine

## 2022-05-19 ENCOUNTER — Ambulatory Visit (INDEPENDENT_AMBULATORY_CARE_PROVIDER_SITE_OTHER): Payer: Medicare Other

## 2022-05-19 ENCOUNTER — Other Ambulatory Visit: Payer: Self-pay

## 2022-05-19 ENCOUNTER — Ambulatory Visit
Admission: EM | Admit: 2022-05-19 | Discharge: 2022-05-19 | Disposition: A | Discharge status: Home / Self Care | Payer: Medicare Other | Attending: Family Medicine | Admitting: Family Medicine

## 2022-05-19 DIAGNOSIS — M25552 Pain in left hip: Secondary | ICD-10-CM | POA: Diagnosis not present

## 2022-05-19 DIAGNOSIS — R1032 Left lower quadrant pain: Secondary | ICD-10-CM

## 2022-05-19 DIAGNOSIS — M545 Low back pain, unspecified: Secondary | ICD-10-CM | POA: Diagnosis not present

## 2022-05-19 DIAGNOSIS — R109 Unspecified abdominal pain: Secondary | ICD-10-CM | POA: Diagnosis present

## 2022-05-19 LAB — COMPREHENSIVE METABOLIC PANEL
ALT: 21 U/L (ref 0–44)
AST: 15 U/L (ref 15–41)
Albumin: 4.7 g/dL (ref 3.5–5.0)
Alkaline Phosphatase: 49 U/L (ref 38–126)
Anion gap: 9 (ref 5–15)
BUN: 19 mg/dL (ref 8–23)
CO2: 28 mmol/L (ref 22–32)
Calcium: 9.5 mg/dL (ref 8.9–10.3)
Chloride: 99 mmol/L (ref 98–111)
Creatinine, Ser: 0.88 mg/dL (ref 0.44–1.00)
GFR, Estimated: >60 mL/min (ref >60)
Glucose, Bld: 226 mg/dL — ABNORMAL HIGH (ref 70–99)
Potassium: 4 mmol/L (ref 3.5–5.1)
Sodium: 136 mmol/L (ref 135–145)
Total Bilirubin: 0.7 mg/dL (ref 0.3–1.2)
Total Protein: 7.9 g/dL (ref 6.5–8.1)

## 2022-05-19 LAB — CBC WITH DIFFERENTIAL/PLATELET
Abs Immature Granulocytes: 0.08 10*3/uL — ABNORMAL HIGH (ref 0.00–0.07)
Basophils Absolute: 0.1 10*3/uL (ref 0.0–0.1)
Basophils Relative: 1 %
Eosinophils Absolute: 0.1 10*3/uL (ref 0.0–0.5)
Eosinophils Relative: 1 %
HCT: 45.7 % (ref 36.0–46.0)
Hemoglobin: 15.8 g/dL — ABNORMAL HIGH (ref 12.0–15.0)
Immature Granulocytes: 1 %
Lymphocytes Relative: 28 %
Lymphs Abs: 3.8 10*3/uL (ref 0.7–4.0)
MCH: 31.5 pg (ref 26.0–34.0)
MCHC: 34.6 g/dL (ref 30.0–36.0)
MCV: 91.2 fL (ref 80.0–100.0)
Monocytes Absolute: 0.9 10*3/uL (ref 0.1–1.0)
Monocytes Relative: 7 %
Neutro Abs: 8.5 10*3/uL — ABNORMAL HIGH (ref 1.7–7.7)
Neutrophils Relative %: 62 %
Platelets: 330 10*3/uL (ref 150–400)
RBC: 5.01 MIL/uL (ref 3.87–5.11)
RDW: 12.6 % (ref 11.5–15.5)
WBC: 13.4 10*3/uL — ABNORMAL HIGH (ref 4.0–10.5)
nRBC: 0 % (ref 0.0–0.2)

## 2022-05-19 LAB — URINALYSIS, ROUTINE W REFLEX MICROSCOPIC
Bilirubin Urine: NEGATIVE
Glucose, UA: 500 mg/dL — AB
Hgb urine dipstick: NEGATIVE
Leukocytes,Ua: NEGATIVE
Nitrite: NEGATIVE
Protein, ur: NEGATIVE mg/dL
Specific Gravity, Urine: 1.02 (ref 1.005–1.030)
pH: 5.5 (ref 5.0–8.0)

## 2022-05-19 MED ORDER — TIZANIDINE HCL 4 MG PO TABS: 4.0000 mg | ORAL_TABLET | Freq: Four times a day (QID) | ORAL | 0 refills | Status: DC | PRN

## 2022-05-19 MED ORDER — IOHEXOL 300 MG/ML  SOLN
100.0000 mL | Freq: Once | INTRAMUSCULAR | Status: AC | PRN
  Administered 2022-05-19: 100 mL via INTRAVENOUS

## 2022-05-19 MED ORDER — DICLOFENAC SODIUM 1 % EX GEL: 4.0000 g | Freq: Four times a day (QID) | CUTANEOUS | 0 refills | Status: DC

## 2022-05-19 MED ORDER — KETOROLAC TROMETHAMINE 60 MG/2ML IM SOLN
30.0000 mg | Freq: Once | INTRAMUSCULAR | Status: AC
  Administered 2022-05-19: 30 mg via INTRAMUSCULAR

## 2022-05-19 NOTE — Discharge Instructions (Signed)
Your imaging did not show cause of your left-sided pain.  I sent some muscle relaxers to your pharmacy.  You can take at 1000 mg of Tylenol 3 times a day as needed for pain.  Consider using topical diclofenac gel and lidocaine patches for additional pain relief.

## 2022-05-19 NOTE — ED Triage Notes (Signed)
Pt c/o left hip pain. Started about a week ago. She states it has been getting worse. No known injuries. She states the pain is worse when she walks.

## 2022-05-19 NOTE — ED Provider Notes (Signed)
[facility]    CSN: [id number] Arrival date & time: 05/19/22  1456      History   Chief Complaint Chief Complaint  Patient presents with   Hip Pain    left    HPI  HPI Samantha Welch is a 71 y.o. female.   Samantha Welch presents for left hip pain that started a couple weeks ago.  States that then last week the pain got worse.  She called her primary care doctor but was not able to get an appointment for about another week.  She was advised to go to the urgent care.  She states that she typically has this kind of pain and she can normally feel a knot.  However this time, pain is increased.  She has a history of frequent UTIs and kidney stones but not had one in a while.  She takes daily antibiotics for to prevent UTIs.  Her last A1c was around her goal.  She is not having any burning when she pees, urinary frequency, blood in her urine or stool.  There has been no vaginal discharge or itching.  No new bowel or bladder incontinence.  No saddle paresthesias.  Pain is moderately severe and is aching and sharp at times.  It radiates from her left lower back down into her groin.  She denies any injury.  Pain is worse when she walks.  She has taken some Tylenol without relief.  She continues to have pain with movement.  It does not feel like her legs are weak.  She cannot sit still.    Fever : no  Sore throat: no   Cough: no Appetite: normal  Hydration: normal  Abdominal pain: yes Nausea: no Vomiting: no Sleep disturbance: yes Back Pain: yes Headache: no     Past Medical History:  Diagnosis Date   Anxiety    Arthritis    Cancer ([place])    skin ca on face   Chronic kidney disease    Left kidney stone   COPD (chronic obstructive pulmonary disease) (HCC)    Depression    Diabetes mellitus without complication (HCC)    GERD (gastroesophageal reflux disease)    Hyperlipemia    Stomach ulcer    Tobacco abuse disorder     Patient Active Problem List   Diagnosis Date  Noted   Cystocele, grade 2 02/10/2016   Recurrent UTI 02/10/2016   UTI (urinary tract infection), bacterial 01/20/2016   Urinary frequency 01/20/2016   Hydronephrosis with urinary obstruction due to ureteral calculus 12/01/2015   Microscopic hematuria 12/01/2015   Left flank pain    UTI (lower urinary tract infection)    Left ureteral stone 11/16/2015    Past Surgical History:  Procedure Laterality Date   COLONOSCOPY WITH PROPOFOL N/A 10/05/2021   Procedure: COLONOSCOPY WITH PROPOFOL;  Surgeon: Annamaria Helling, DO;  Location: [facility]COPY;  Service: Gastroenterology;  Laterality: N/A;   CYSTOSCOPY W/ URETERAL STENT PLACEMENT Left 12/15/2015   Procedure: CYSTOSCOPY WITH STENT REPLACEMENT;  Surgeon: Hollice Espy, MD;  Location: [facility] ORS;  Service: Urology;  Laterality: Left;   CYSTOSCOPY WITH STENT PLACEMENT Left 11/16/2015   Procedure: CYSTOSCOPY WITH LEFT STENT PLACEMENT, LEFT RETROGRADE;  Surgeon: Dereck Leep, MD;  Location: [facility];  Service: Urology;  Laterality: Left;   FOOT SURGERY Bilateral    KNEE ARTHROSCOPY WITH MEDIAL MENISECTOMY Left 03/20/2020   Procedure: knee arthroscopic partial medial AND lateral meniscectomy;  Surgeon: Leim Fabry, MD;  Location: [place]  [facility];  Service: Orthopedics;  Laterality: Left;  Diabetic - oral meds   TUBAL LIGATION     URETEROSCOPY WITH HOLMIUM LASER LITHOTRIPSY Left 12/15/2015   Procedure: URETEROSCOPY WITH HOLMIUM LASER LITHOTRIPSY;  Surgeon: Hollice Espy, MD;  Location: [facility];  Service: Urology;  Laterality: Left;    OB History     Gravida  2   Para  2   Term  2   Preterm  0   AB  0   Living  0      SAB  0   IAB  0   Ectopic  0   Multiple  0   Live Births               Home Medications    Prior to Admission medications   Medication Sig Start Date End Date Taking? Authorizing Provider  aspirin 81 MG chewable tablet Chew 81 mg by mouth daily.   Yes [provider]  buPROPion  (WELLBUTRIN XL) 150 MG 24 hr tablet Take 150 mg by mouth daily.   Yes [provider]  cephALEXin (KEFLEX) 250 MG capsule Take by mouth daily at 8 pm.   Yes [provider]  clonazePAM (KLONOPIN) 1 MG tablet Take 1 mg by mouth 2 (two) times daily.   Yes [provider]  diclofenac Sodium (VOLTAREN) 1 % GEL Apply 4 g topically 4 (four) times daily. 05/19/22  Yes Khyron Garno, DO  dicyclomine (BENTYL) 10 MG capsule Take 10 mg by mouth 4 (four) times daily -  before meals and at bedtime.   Yes [provider]  hydrOXYzine (ATARAX/VISTARIL) 25 MG tablet Take 25 mg by mouth at bedtime as needed.   Yes [provider]  meloxicam (MOBIC) 15 MG tablet Take 15 mg by mouth daily.   Yes [provider]  metFORMIN (GLUCOPHAGE) 1000 MG tablet Take 500 mg by mouth every morning.  10/05/15  Yes [provider]  nitrofurantoin (MACRODANTIN) 100 MG capsule Take 100 mg by mouth daily.   Yes [provider]  Omega-3 Fatty Acids (FISH OIL OMEGA-3) 1000 MG CAPS Take 2,000 mg by mouth.   Yes [provider]  pantoprazole (PROTONIX) 40 MG tablet Take 40 mg by mouth at bedtime.   Yes [provider]  polyethylene glycol (MIRALAX / GLYCOLAX) 17 g packet Take 17 g by mouth daily.   Yes [provider]  pravastatin (PRAVACHOL) 20 MG tablet Take 20 mg by mouth at bedtime. 11/03/15  Yes [provider]  promethazine (PHENERGAN) 25 MG tablet Take 25 mg by mouth every 6 (six) hours as needed for nausea or vomiting.   Yes [provider]  tiotropium (SPIRIVA) 18 MCG inhalation capsule Place 18 mcg into inhaler and inhale daily.   Yes [provider]  tiZANidine (ZANAFLEX) 4 MG tablet Take 1 tablet (4 mg total) by mouth every 6 (six) hours as needed for muscle spasms. 05/19/22  Yes Nickolaos Brallier, DO  traMADol (ULTRAM) 50 MG tablet Take by mouth every 6 (six) hours as needed.   Yes [provider]   HYDROcodone-acetaminophen (NORCO) 5-325 MG tablet Take 1-2 tablets by mouth every 4 (four) hours as needed for moderate pain or severe pain. 03/20/20   Leim Fabry, MD  ondansetron (ZOFRAN ODT) 4 MG disintegrating tablet Take 1 tablet (4 mg total) by mouth every 8 (eight) hours as needed for nausea or vomiting. 03/20/20   Leim Fabry, MD    Family History Family  History  Problem Relation Age of Onset   Colon cancer Brother    Bladder Cancer Neg Hx    Kidney cancer Neg Hx    Prostate cancer Neg Hx    Breast cancer Neg Hx     Social History Social History   Tobacco Use   Smoking status: Every Day    Packs/day: 0.50    Years: 20.00    Total pack years: 10.00    Types: Cigarettes   Smokeless tobacco: Never  Vaping Use   Vaping Use: Never used  Substance Use Topics   Alcohol use: No    Alcohol/week: 0.0 standard drinks of alcohol   Drug use: No     Allergies   Patient has no known allergies.   Review of Systems Review of Systems: :negative unless otherwise stated in HPI.      Physical Exam Triage Vital Signs ED Triage Vitals  Enc Vitals Group     BP 05/19/22 1610 (!) 162/68     Pulse Rate 05/19/22 1610 67     Resp 05/19/22 1610 16     Temp 05/19/22 1610 98.2 F (36.8 C)     Temp Source 05/19/22 1610 Oral     SpO2 05/19/22 1610 99 %     Weight 05/19/22 1607 145 lb 1 oz (65.8 kg)     Height 05/19/22 1607 '5\' 2"'$  (1.575 m)     Head Circumference --      Peak Flow --      Pain Score 05/19/22 1606 10     Pain Loc --      Pain Edu? --      Excl. in [place]? --    No data found.  Updated Vital Signs BP (!) 162/68 (BP Location: Left Arm)   Pulse 67   Temp 98.2 F (36.8 C) (Oral)   Resp 16   Ht '5\' 2"'$  (1.575 m)   Wt 65.8 kg   SpO2 99%   BMI 26.53 kg/m   Visual Acuity Right Eye Distance:   Left Eye Distance:   Bilateral Distance:    Right Eye Near:   Left Eye Near:    Bilateral Near:     Physical Exam GEN: Uncomfortable appearing female  CVS: well  perfused, regular rate, DP pulses palpable RESP: speaking in full sentences without pause, no respiratory distress  MSK:  Lumbar spine: - Inspection: no gross deformity or asymmetry, swelling or ecchymosis. No skin changes - Palpation: No TTP over the spinous processes or SI joints b/l, left lower lumbar paraspinal muscles tender to palpation extending to anterior left hip, left lumbar paraspinal hypertonicity present - ROM: ROM limited due to pain - Neuro: sensation intact in the L4-S1 nerve root distribution b/l,  - Special testing: Negative straight leg raise    UC Treatments / Results  Labs (all labs ordered are listed, but only abnormal results are displayed) Labs Reviewed  COMPREHENSIVE METABOLIC PANEL - Abnormal; Notable for the following components:      Result Value   Glucose, Bld 226 (*)    All other components within normal limits  CBC WITH DIFFERENTIAL/PLATELET - Abnormal; Notable for the following components:   WBC 13.4 (*)    Hemoglobin 15.8 (*)    Neutro Abs 8.5 (*)    Abs Immature Granulocytes 0.08 (*)    All other components within normal limits  URINALYSIS, ROUTINE W REFLEX MICROSCOPIC - Abnormal; Notable for the following components:   APPearance HAZY (*)  Glucose, UA 500 (*)    Ketones, ur TRACE (*)    All other components within normal limits    EKG   Radiology CT ABDOMEN PELVIS W CONTRAST  Result Date: 05/19/2022 CLINICAL DATA:  Left lower quadrant pain EXAM: CT ABDOMEN AND PELVIS WITH CONTRAST TECHNIQUE: Multidetector CT imaging of the abdomen and pelvis was performed using the standard protocol following bolus administration of intravenous contrast. RADIATION DOSE REDUCTION: This exam was performed according to the departmental dose-optimization program which includes automated exposure control, adjustment of the mA and/or kV according to patient size and/or use of iterative reconstruction technique. CONTRAST:  178m OMNIPAQUE IOHEXOL 300 MG/ML  SOLN  COMPARISON:  CT 07/27/2021 FINDINGS: Lower chest: No acute abnormality. Hepatobiliary: Hepatic steatosis. No calcified gallstone or biliary dilatation Pancreas: Unremarkable. No pancreatic ductal dilatation or surrounding inflammatory changes. Spleen: Normal in size without focal abnormality. Adrenals/Urinary Tract: Adrenal glands are unremarkable. Kidneys are without hydronephrosis or mass. Mild cortical scarring mid left kidney. Bladder is slightly thick walled but is nearly empty. Stomach/Bowel: Stomach is within normal limits. Appendix appears normal. No evidence of bowel wall thickening, distention, or inflammatory changes. Mild diverticular disease of left colon without acute wall thickening. Vascular/Lymphatic: Mild aortic atherosclerosis. No aneurysm. No suspicious lymph nodes. Reproductive: Uterus and bilateral adnexa are unremarkable. Other: Negative for pelvic effusion or free air. Musculoskeletal: No acute osseous abnormality IMPRESSION: 1. No CT evidence for acute intra-abdominal or pelvic abnormality. 2. Hepatic steatosis. Electronically Signed   By: KDonavan FoilM.D.   On: 05/19/2022 19:39   DG Hip Unilat With Pelvis 2-3 Views Left  Result Date: 05/19/2022 CLINICAL DATA:  Hip pain EXAM: DG HIP (WITH OR WITHOUT PELVIS) 2-3V LEFT COMPARISON:  None Available. FINDINGS: There is no evidence of hip fracture or dislocation. There is no evidence of arthropathy or other focal bone abnormality. IMPRESSION: Negative. Electronically Signed   By: KDonavan FoilM.D.   On: 05/19/2022 17:43   DG Lumbar Spine Complete  Result Date: 05/19/2022 CLINICAL DATA:  Low back pain EXAM: LUMBAR SPINE - COMPLETE 4+ VIEW COMPARISON:  CT 07/27/2021 FINDINGS: Five non rib-bearing lumbar type vertebra. Vertebral body heights are maintained. Mild disc space narrowing and degenerative change at L2-L3, L3-L4 and L5-S1. Moderate facet degenerative changes of the lower lumbar spine. IMPRESSION: 1. Multilevel degenerative changes.   No acute osseous abnormality Electronically Signed   By: KDonavan FoilM.D.   On: 05/19/2022 17:43    Procedures Procedures (including critical care time)  Medications Ordered in UC Medications  ketorolac (TORADOL) injection 30 mg (30 mg Intramuscular Given 05/19/22 1707)  iohexol (OMNIPAQUE) 300 MG/ML solution 100 mL (100 mLs Intravenous Contrast Given 05/19/22 1922)    Initial Impression / Assessment and Plan / UC Course  I have reviewed the triage vital signs and the nursing notes.  Pertinent labs & imaging results that were available during my care of the patient were reviewed by me and considered in my medical decision making (see chart for details).       Pt is a 71y.o.  female with 1 week of left hip pain that radiates to her groin.  Exam is concerning for muscular strain versus lumbar arthritis however she is very uncomfortable and has history of frequent UTIs and kidney stones.  Obtained lumbar and left hip films, personally reviewed by me.  She has some degenerative disease in her lumbar spine, no fractures or gross mild alignment seen, radiology notes some mild disc space narrowing.  Left hip without fracture or dislocation.  There was no hip arthritis seen, per radiology.  Urinalysis with glucose urea and mild ketonuria but without evidence of acute cystitis.  She has a leukocytosis with left shift and a slight polycythemia.  She may be a little dehydrated.  She is afebrile here but given her elevated white count, WBC 13.4 we obtained a CT abdomen pelvis with contrast.  There was no evidence of appendicitis or pancreatitis.  She has some mild diverticular disease in the left colon but did not show any acute wall thickening.  Her uterus and adnexa were unremarkable.  Overall, no evidence of acute intra-abdominal or pelvic abnormality seen that would be the cause of patient's pain.  Suspect her pain may be musculoskeletal after all.  Toradol injection given did provide some relief.   Advised patient to pick up some diclofenac gel and given a muscle relaxer at her pharmacy.  She is to gradually return to normal activities, as tolerated and continue ordinary activities within the limits permitted by pain.  Tylenol PRN. Counseled patient on red flag symptoms and when to seek immediate care.  No red flags such as progressive major motor weakness.   Patient to follow up with primary care provider, if symptoms do not improve with conservative treatment.  Return and ED precautions given.   Discussed MDM, treatment plan and plan for follow-up with patient/parent who agrees with plan.   Final Clinical Impressions(s) / UC Diagnoses   Final diagnoses:  Left flank pain     Discharge Instructions      Your imaging did not show cause of your left-sided pain.  I sent some muscle relaxers to your pharmacy.  You can take at 1000 mg of Tylenol 3 times a day as needed for pain.  Consider using topical diclofenac gel and lidocaine patches for additional pain relief.     ED Prescriptions     Medication Sig Dispense Auth. Provider   tiZANidine (ZANAFLEX) 4 MG tablet Take 1 tablet (4 mg total) by mouth every 6 (six) hours as needed for muscle spasms. 30 tablet Roxas Clymer, DO   diclofenac Sodium (VOLTAREN) 1 % GEL Apply 4 g topically 4 (four) times daily. 100 g Lyndee Hensen, DO      PDMP not reviewed this encounter.   Lyndee Hensen, DO 05/20/22 [phone]

## 2022-05-31 ENCOUNTER — Ambulatory Visit
Admission: RE | Admit: 2022-05-31 | Discharge: 2022-05-31 | Disposition: A | Discharge status: Home / Self Care | Payer: Medicare Other | Source: Ambulatory Visit | Attending: Family | Admitting: Family

## 2022-05-31 DIAGNOSIS — Z1231 Encounter for screening mammogram for malignant neoplasm of breast: Secondary | ICD-10-CM | POA: Diagnosis present

## 2022-07-15 ENCOUNTER — Ambulatory Visit: Payer: Self-pay | Admitting: Family Medicine

## 2022-09-10 ENCOUNTER — Other Ambulatory Visit: Payer: Self-pay | Admitting: Orthopedic Surgery

## 2022-09-10 DIAGNOSIS — M5136 Other intervertebral disc degeneration, lumbar region: Secondary | ICD-10-CM

## 2022-09-10 DIAGNOSIS — M47817 Spondylosis without myelopathy or radiculopathy, lumbosacral region: Secondary | ICD-10-CM

## 2022-09-10 DIAGNOSIS — G8929 Other chronic pain: Secondary | ICD-10-CM

## 2022-09-16 ENCOUNTER — Ambulatory Visit
Admission: RE | Admit: 2022-09-16 | Discharge: 2022-09-16 | Disposition: A | Discharge status: Home / Self Care | Payer: 59 | Source: Ambulatory Visit | Attending: Orthopedic Surgery | Admitting: Orthopedic Surgery

## 2022-09-16 DIAGNOSIS — M5440 Lumbago with sciatica, unspecified side: Secondary | ICD-10-CM | POA: Diagnosis not present

## 2022-09-16 DIAGNOSIS — M5136 Other intervertebral disc degeneration, lumbar region: Secondary | ICD-10-CM | POA: Diagnosis present

## 2022-09-16 DIAGNOSIS — G8929 Other chronic pain: Secondary | ICD-10-CM | POA: Insufficient documentation

## 2022-09-16 DIAGNOSIS — M47817 Spondylosis without myelopathy or radiculopathy, lumbosacral region: Secondary | ICD-10-CM | POA: Insufficient documentation

## 2022-09-21 ENCOUNTER — Other Ambulatory Visit: Payer: Self-pay | Admitting: Internal Medicine

## 2022-09-21 ENCOUNTER — Encounter: Payer: Self-pay | Admitting: Internal Medicine

## 2022-09-21 ENCOUNTER — Ambulatory Visit (INDEPENDENT_AMBULATORY_CARE_PROVIDER_SITE_OTHER): Payer: 59 | Admitting: Internal Medicine

## 2022-09-21 VITALS — BP 128/58 | HR 74 | Ht 62.0 in | Wt 156.6 lb

## 2022-09-21 DIAGNOSIS — N390 Urinary tract infection, site not specified: Secondary | ICD-10-CM | POA: Diagnosis not present

## 2022-09-21 DIAGNOSIS — F324 Major depressive disorder, single episode, in partial remission: Secondary | ICD-10-CM

## 2022-09-21 DIAGNOSIS — K581 Irritable bowel syndrome with constipation: Secondary | ICD-10-CM | POA: Insufficient documentation

## 2022-09-21 DIAGNOSIS — J441 Chronic obstructive pulmonary disease with (acute) exacerbation: Secondary | ICD-10-CM | POA: Insufficient documentation

## 2022-09-21 DIAGNOSIS — E1169 Type 2 diabetes mellitus with other specified complication: Secondary | ICD-10-CM

## 2022-09-21 DIAGNOSIS — E118 Type 2 diabetes mellitus with unspecified complications: Secondary | ICD-10-CM

## 2022-09-21 DIAGNOSIS — K589 Irritable bowel syndrome without diarrhea: Secondary | ICD-10-CM | POA: Insufficient documentation

## 2022-09-21 DIAGNOSIS — E785 Hyperlipidemia, unspecified: Secondary | ICD-10-CM

## 2022-09-21 LAB — POCT URINALYSIS DIPSTICK
Bilirubin, UA: NEGATIVE
Glucose, UA: POSITIVE — AB
Ketones, UA: NEGATIVE
Nitrite, UA: NEGATIVE
Protein, UA: NEGATIVE
Spec Grav, UA: 1.015 (ref 1.010–1.025)
Urobilinogen, UA: 0.2 E.U./dL
pH, UA: 5 (ref 5.0–8.0)

## 2022-09-21 MED ORDER — SULFAMETHOXAZOLE-TRIMETHOPRIM 800-160 MG PO TABS: 1.0000 | ORAL_TABLET | Freq: Two times a day (BID) | ORAL | 0 refills | Status: AC

## 2022-09-21 NOTE — Assessment & Plan Note (Addendum)
Apparently on Keflex for prevention Having more symptoms today - will get UA and Cx Start treatment now with Bactrim She is encouraged to drink more water to help clear the infection

## 2022-09-21 NOTE — Assessment & Plan Note (Signed)
Long standing intermittent lower abdominal symptoms of cramping and bloating Takes Bentyl tid-qid and Protonix Recent Colonoscopy showed benign polyps

## 2022-09-21 NOTE — Assessment & Plan Note (Signed)
She is followed and treated by Psych - Dr. Kasandra Knudsen Currently on clonazepam 1 mg bid and

## 2022-09-21 NOTE — Progress Notes (Signed)
Date:  09/21/2022   Name:  Samantha Welch   DOB:  1951-09-03   MRN:  [id number]   Chief Complaint: New Patient (Initial Visit) and Dysuria  Dysuria  This is a recurrent (hx of chronic UTI - on daily keflex) problem. The quality of the pain is described as burning. The pain is mild. There has been no fever. Associated symptoms include urgency. Pertinent negatives include no chills or hematuria. Treatments tried: on Keflex for prevention.  Diabetes She has type 2 diabetes mellitus. Her disease course has been stable. Pertinent negatives for hypoglycemia include no dizziness, headaches or tremors. Pertinent negatives for diabetes include no chest pain, no fatigue, no polydipsia, no polyuria and no weakness. Current diabetic treatment includes oral agent (monotherapy) (metformin). She is compliant with treatment all of the time. Her breakfast blood glucose is taken between 6-7 am. Her breakfast blood glucose range is generally 130-140 mg/dl. An ACE inhibitor/angiotensin II receptor blocker is not being taken. Eye exam is not current.  Depression        This is a chronic problem.The problem is unchanged.  Associated symptoms include no fatigue, no appetite change and no headaches.   Lab Results  Component Value Date   NA 136 05/19/2022   K 4.0 05/19/2022   CO2 28 05/19/2022   GLUCOSE 226 (H) 05/19/2022   BUN 19 05/19/2022   CREATININE 0.88 05/19/2022   CALCIUM 9.5 05/19/2022   GFRNONAA >60 05/19/2022   Lab Results  Component Value Date   CHOL 170 05/04/2022   HDL 46 05/04/2022   LDLCALC 84 05/04/2022   TRIG 198 (A) 05/04/2022   No results found for: "TSH" Lab Results  Component Value Date   HGBA1C 7.0 05/04/2022   Lab Results  Component Value Date   WBC 13.4 (H) 05/19/2022   HGB 15.8 (H) 05/19/2022   HCT 45.7 05/19/2022   MCV 91.2 05/19/2022   PLT 330 05/19/2022   Lab Results  Component Value Date   ALT 21 05/19/2022   AST 15 05/19/2022   ALKPHOS 49 05/19/2022   BILITOT  0.7 05/19/2022   No results found for: "25OHVITD2", "25OHVITD3", "VD25OH"   Review of Systems  Constitutional:  Negative for appetite change, chills, fatigue, fever and unexpected weight change.  HENT:  Negative for nosebleeds, tinnitus and trouble swallowing.   Eyes:  Negative for visual disturbance.  Respiratory:  Negative for cough, chest tightness, shortness of breath and wheezing.   Cardiovascular:  Negative for chest pain, palpitations and leg swelling.  Gastrointestinal:  Positive for abdominal pain (intermittent abd symptoms due to IBS). Negative for constipation and diarrhea.  Endocrine: Negative for polydipsia and polyuria.  Genitourinary:  Positive for dysuria and urgency. Negative for hematuria.  Musculoskeletal:  Negative for arthralgias.  Neurological:  Negative for dizziness, tremors, weakness, light-headedness, numbness and headaches.  Psychiatric/Behavioral:  Positive for depression. Negative for dysphoric mood.     Patient Active Problem List   Diagnosis Date Noted   Irritable bowel syndrome 09/21/2022   Tobacco abuse disorder 03/17/2021   History of kidney stones 12/25/2018   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/25/2018   Major depression single episode, in partial remission ([place]) 03/31/2015   Hyperlipidemia associated with type 2 diabetes mellitus ([place]) 07/25/2014   Type II diabetes mellitus with complication ([place]) 44/31/5400   GERD (gastroesophageal reflux disease) 04/23/2011    Allergies  Allergen Reactions   Venlafaxine Nausea Only    Past Surgical History:  Procedure Laterality Date   COLONOSCOPY WITH  PROPOFOL N/A 10/05/2021   Procedure: COLONOSCOPY WITH PROPOFOL;  Surgeon: Annamaria Helling, DO;  Location: [facility] ENDOSCOPY;  Service: Gastroenterology;  Laterality: N/A;   CYSTOSCOPY W/ URETERAL STENT PLACEMENT Left 12/15/2015   Procedure: CYSTOSCOPY WITH STENT REPLACEMENT;  Surgeon: Hollice Espy, MD;  Location: [facility] ORS;  Service: Urology;  Laterality: Left;   CYSTOSCOPY  WITH STENT PLACEMENT Left 11/16/2015   Procedure: CYSTOSCOPY WITH LEFT STENT PLACEMENT, LEFT RETROGRADE;  Surgeon: Dereck Leep, MD;  Location: [facility] ORS;  Service: Urology;  Laterality: Left;   FOOT SURGERY Bilateral    KNEE ARTHROSCOPY WITH MEDIAL MENISECTOMY Left 03/20/2020   Procedure: knee arthroscopic partial medial AND lateral meniscectomy;  Surgeon: Leim Fabry, MD;  Location: [place];  Service: Orthopedics;  Laterality: Left;  Diabetic - oral meds   TUBAL LIGATION     URETEROSCOPY WITH HOLMIUM LASER LITHOTRIPSY Left 12/15/2015   Procedure: URETEROSCOPY WITH HOLMIUM LASER LITHOTRIPSY;  Surgeon: Hollice Espy, MD;  Location: [facility] ORS;  Service: Urology;  Laterality: Left;    Social History   Tobacco Use   Smoking status: Every Day    Packs/day: 0.50    Years: 20.00    Total pack years: 10.00    Types: Cigarettes   Smokeless tobacco: Never  Vaping Use   Vaping Use: Never used  Substance Use Topics   Alcohol use: No    Alcohol/week: 0.0 standard drinks of alcohol   Drug use: No     Medication list has been reviewed and updated.  Current Meds  Medication Sig   aspirin 81 MG chewable tablet Chew 81 mg by mouth daily.   cephALEXin (KEFLEX) 250 MG capsule Take by mouth daily at 8 pm.   clonazePAM (KLONOPIN) 1 MG tablet Take 1 mg by mouth 2 (two) times daily.   dicyclomine (BENTYL) 10 MG capsule Take 10 mg by mouth 4 (four) times daily -  before meals and at bedtime.   gabapentin (NEURONTIN) 100 MG capsule Take 100 mg by mouth 3 (three) times daily.   meloxicam (MOBIC) 15 MG tablet Take 15 mg by mouth daily.   metFORMIN (GLUCOPHAGE) 1000 MG tablet Take 500 mg by mouth 2 (two) times daily with a meal.   pantoprazole (PROTONIX) 40 MG tablet Take 40 mg by mouth at bedtime.   pravastatin (PRAVACHOL) 20 MG tablet Take 20 mg by mouth at bedtime.   sulfamethoxazole-trimethoprim (BACTRIM DS) 800-160 MG tablet Take 1 tablet by mouth 2 (two) times daily for 10 days.    tiZANidine (ZANAFLEX) 4 MG tablet Take 1 tablet (4 mg total) by mouth every 6 (six) hours as needed for muscle spasms.   traMADol (ULTRAM) 50 MG tablet Take by mouth every 6 (six) hours as needed.   traZODone (DESYREL) 50 MG tablet Take 50-100 mg by mouth at bedtime.       09/21/2022    3:02 PM  GAD 7 : Generalized Anxiety Score  Nervous, Anxious, on Edge 3  Control/stop worrying 3  Worry too much - different things 3  Trouble relaxing 2  Restless 2  Easily annoyed or irritable 1  Afraid - awful might happen 1  Total GAD 7 Score 15  Anxiety Difficulty Not difficult at all       09/21/2022    3:02 PM  Depression screen PHQ 2/9  Decreased Interest 2  Down, Depressed, Hopeless 1  PHQ - 2 Score 3  Altered sleeping 0  Tired, decreased energy 1  Change in appetite 1  Feeling bad or failure about yourself  0  Trouble concentrating 1  Moving slowly or fidgety/restless 1  Suicidal thoughts 0  PHQ-9 Score 7  Difficult doing work/chores Somewhat difficult    BP Readings from Last 3 Encounters:  09/21/22 (!) 128/58  05/19/22 (!) 162/68  10/05/21 120/68    Physical Exam Vitals and nursing note reviewed.  Constitutional:      General: She is not in acute distress.    Appearance: Normal appearance. She is well-developed.  HENT:     Head: Normocephalic and atraumatic.  Neck:     Vascular: No carotid bruit.  Cardiovascular:     Rate and Rhythm: Normal rate and regular rhythm.  Pulmonary:     Effort: Pulmonary effort is normal. No respiratory distress.     Breath sounds: No wheezing or rhonchi.  Abdominal:     General: Abdomen is flat.     Palpations: Abdomen is soft.     Tenderness: There is abdominal tenderness.  Musculoskeletal:     Cervical back: Normal range of motion.     Right lower leg: No edema.     Left lower leg: No edema.  Lymphadenopathy:     Cervical: No cervical adenopathy.  Skin:    General: Skin is warm and dry.     Capillary Refill: Capillary  refill takes less than 2 seconds.     Findings: No rash.  Neurological:     General: No focal deficit present.     Mental Status: She is alert and oriented to person, place, and time.  Psychiatric:        Mood and Affect: Mood normal.        Behavior: Behavior normal.    Diabetic Foot Exam - Simple   Simple Foot Form Diabetic Foot exam was performed with the following findings: Yes 09/21/2022  3:22 PM  Visual Inspection No deformities, no ulcerations, no other skin breakdown bilaterally: Yes Sensation Testing Intact to touch and monofilament testing bilaterally: Yes Pulse Check Posterior Tibialis and Dorsalis pulse intact bilaterally: Yes Comments      Wt Readings from Last 3 Encounters:  09/21/22 156 lb 9.6 oz (71 kg)  05/19/22 145 lb 1 oz (65.8 kg)  10/05/21 145 lb (65.8 kg)    BP (!) 128/58   Pulse 74   Ht '5\' 2"'$  (1.575 m)   Wt 156 lb 9.6 oz (71 kg)   SpO2 95%   BMI 28.64 kg/m   Assessment and Plan: Problem List Items Addressed This Visit       Digestive   Irritable bowel syndrome (Chronic)    Long standing intermittent lower abdominal symptoms of cramping and bloating Takes Bentyl tid-qid and Protonix Recent Colonoscopy showed benign polyps      Relevant Orders   CBC with Differential/Platelet     Endocrine   Hyperlipidemia associated with type 2 diabetes mellitus (HCC) (Chronic)    Tolerating statin medication without side effects at this time LDL is almost at goal of < 70 on current dose.  Last LDL 84 Continue same therapy without change at this time.       Type II diabetes mellitus with complication (HCC) (Chronic)    Clinically stable by exam and report without s/s of hypoglycemia. DM complicated by hypertension and dyslipidemia. Tolerating metformin well without side effects or other concerns.       Relevant Orders   Comprehensive metabolic panel   Hemoglobin A1c     Genitourinary   Chronic UTI -  Primary (Chronic)    Apparently on Keflex  for prevention Having more symptoms today - will get UA and Cx Start treatment now with Bactrim She is encouraged to drink more water to help clear the infection      Relevant Medications   sulfamethoxazole-trimethoprim (BACTRIM DS) 800-160 MG tablet   Other Relevant Orders   POCT Urinalysis Dipstick (Completed)   Urine Culture     Other   Major depression single episode, in partial remission ([place]) (Chronic)    She is followed and treated by Psych - Dr. Kasandra Knudsen Currently on clonazepam 1 mg bid and       Relevant Medications   traZODone (DESYREL) 50 MG tablet     Partially dictated using Editor, commissioning. Any errors are unintentional.  Halina Maidens, MD [facility]  09/21/2022

## 2022-09-21 NOTE — Assessment & Plan Note (Signed)
Tolerating statin medication without side effects at this time LDL is almost at goal of < 70 on current dose.  Last LDL 84 Continue same therapy without change at this time.

## 2022-09-21 NOTE — Assessment & Plan Note (Signed)
Clinically stable by exam and report without s/s of hypoglycemia. DM complicated by hypertension and dyslipidemia. Tolerating metformin well without side effects or other concerns.

## 2022-09-22 ENCOUNTER — Other Ambulatory Visit: Payer: Self-pay

## 2022-09-22 DIAGNOSIS — E118 Type 2 diabetes mellitus with unspecified complications: Secondary | ICD-10-CM

## 2022-09-22 LAB — CBC WITH DIFFERENTIAL/PLATELET
Basophils Absolute: 0.1 10*3/uL (ref 0.0–0.2)
Basos: 1 %
EOS (ABSOLUTE): 0.1 10*3/uL (ref 0.0–0.4)
Eos: 1 %
Hematocrit: 46.3 % (ref 34.0–46.6)
Hemoglobin: 15.4 g/dL (ref 11.1–15.9)
Immature Grans (Abs): 0.1 10*3/uL (ref 0.0–0.1)
Immature Granulocytes: 1 %
Lymphocytes Absolute: 3.4 10*3/uL — ABNORMAL HIGH (ref 0.7–3.1)
Lymphs: 31 %
MCH: 31 pg (ref 26.6–33.0)
MCHC: 33.3 g/dL (ref 31.5–35.7)
MCV: 93 fL (ref 79–97)
Monocytes Absolute: 0.8 10*3/uL (ref 0.1–0.9)
Monocytes: 8 %
Neutrophils Absolute: 6.6 10*3/uL (ref 1.4–7.0)
Neutrophils: 58 %
Platelets: 319 10*3/uL (ref 150–450)
RBC: 4.96 x10E6/uL (ref 3.77–5.28)
RDW: 11.6 % — ABNORMAL LOW (ref 11.7–15.4)
WBC: 11.1 10*3/uL — ABNORMAL HIGH (ref 3.4–10.8)

## 2022-09-22 LAB — COMPREHENSIVE METABOLIC PANEL
ALT: 19 IU/L (ref 0–32)
AST: 12 IU/L (ref 0–40)
Albumin/Globulin Ratio: 2.2 (ref 1.2–2.2)
Albumin: 4.9 g/dL — ABNORMAL HIGH (ref 3.8–4.8)
Alkaline Phosphatase: 53 IU/L (ref 44–121)
BUN/Creatinine Ratio: 13 (ref 12–28)
BUN: 13 mg/dL (ref 8–27)
Bilirubin Total: 0.5 mg/dL (ref 0.0–1.2)
CO2: 24 mmol/L (ref 20–29)
Calcium: 10.1 mg/dL (ref 8.7–10.3)
Chloride: 96 mmol/L (ref 96–106)
Creatinine, Ser: 1.02 mg/dL — ABNORMAL HIGH (ref 0.57–1.00)
Globulin, Total: 2.2 g/dL (ref 1.5–4.5)
Glucose: 240 mg/dL — ABNORMAL HIGH (ref 70–99)
Potassium: 4.9 mmol/L (ref 3.5–5.2)
Sodium: 135 mmol/L (ref 134–144)
Total Protein: 7.1 g/dL (ref 6.0–8.5)
eGFR: 58 mL/min/{1.73_m2} — ABNORMAL LOW (ref >59)

## 2022-09-22 LAB — HEMOGLOBIN A1C
Est. average glucose Bld gHb Est-mCnc: 220 mg/dL
Hgb A1c MFr Bld: 9.3 % — ABNORMAL HIGH (ref 4.8–5.6)

## 2022-09-22 MED ORDER — DAPAGLIFLOZIN PROPANEDIOL 5 MG PO TABS: 5.0000 mg | ORAL_TABLET | Freq: Every day | ORAL | 0 refills | Status: DC

## 2022-09-23 LAB — URINE CULTURE

## 2022-09-24 ENCOUNTER — Telehealth: Payer: Self-pay | Admitting: Internal Medicine

## 2022-09-24 NOTE — Telephone Encounter (Signed)
Pt. Calling to verify which antibiotic to take. Verbalizes understanding to take Bactrim DS.

## 2022-10-19 ENCOUNTER — Ambulatory Visit: Payer: Self-pay

## 2022-10-19 ENCOUNTER — Encounter: Payer: Self-pay | Admitting: Family Medicine

## 2022-10-19 ENCOUNTER — Ambulatory Visit (INDEPENDENT_AMBULATORY_CARE_PROVIDER_SITE_OTHER): Payer: 59 | Admitting: Family Medicine

## 2022-10-19 VITALS — BP 138/72 | HR 81 | Ht 62.0 in | Wt 152.0 lb

## 2022-10-19 DIAGNOSIS — N390 Urinary tract infection, site not specified: Secondary | ICD-10-CM

## 2022-10-19 LAB — POCT URINALYSIS DIPSTICK
Bilirubin, UA: NEGATIVE
Glucose, UA: POSITIVE — AB
Ketones, UA: NEGATIVE
Nitrite, UA: POSITIVE
Protein, UA: NEGATIVE
Spec Grav, UA: 1.015 (ref 1.010–1.025)
Urobilinogen, UA: 0.2 E.U./dL
pH, UA: 5 (ref 5.0–8.0)

## 2022-10-19 MED ORDER — ONDANSETRON HCL 4 MG PO TABS: 4.0000 mg | ORAL_TABLET | Freq: Three times a day (TID) | ORAL | 0 refills | Status: DC | PRN

## 2022-10-19 MED ORDER — SULFAMETHOXAZOLE-TRIMETHOPRIM 800-160 MG PO TABS: 1.0000 | ORAL_TABLET | Freq: Two times a day (BID) | ORAL | 0 refills | Status: DC

## 2022-10-19 NOTE — Telephone Encounter (Signed)
    Chief Complaint: Painful urination. Declines OV "I feel too bad to come in." I need medication to be sent in."                                                                                                                                                                                        Symptoms: Painful urination, back and abdominal pain." Frequency: Last week Pertinent Negatives: Patient denies fever Disposition: '[]'$ ED /'[]'$ Urgent Care (no appt availability in office) / '[]'$ Appointment(In office/virtual)/ '[]'$  [facility]/ '[]'$ Home Care/ '[]'$ Refused Recommended Disposition /'[]'$ [facility]/ '[x]'$  Follow-up with PCP Additional Notes:   Answer Assessment - Initial Assessment Questions 1. SYMPTOM: "What's the main symptom you're concerned about?" (e.g., frequency, incontinence)     Pain 2. ONSET: "When did the  -  start?"     Last week 3. PAIN: "Is there any pain?" If Yes, ask: "How bad is it?" (Scale: 1-10; mild, moderate, severe)     Severe 4. CAUSE: "What do you think is causing the symptoms?"     UTI 5. OTHER SYMPTOMS: "Do you have any other symptoms?" (e.g., blood in urine, fever, flank pain, pain with urination)     Pain 6. PREGNANCY: "Is there any chance you are pregnant?" "When was your last menstrual period?"     No  Protocols used: Urinary Symptoms-A-AH

## 2022-10-19 NOTE — Patient Instructions (Signed)
-   Take antibiotics for full course - Can use Zofran as-needed for nausea - Referral coordinator will contact you for scheduling Urology follow-up - Keep follow-up with Dr. Army Melia

## 2022-10-25 LAB — URINE CULTURE

## 2022-10-26 ENCOUNTER — Encounter: Payer: Self-pay | Admitting: Internal Medicine

## 2022-10-26 ENCOUNTER — Ambulatory Visit (INDEPENDENT_AMBULATORY_CARE_PROVIDER_SITE_OTHER): Payer: 59 | Admitting: Internal Medicine

## 2022-10-26 VITALS — BP 112/68 | HR 68 | Ht 62.0 in | Wt 152.0 lb

## 2022-10-26 DIAGNOSIS — N390 Urinary tract infection, site not specified: Secondary | ICD-10-CM | POA: Diagnosis not present

## 2022-10-26 LAB — POCT URINALYSIS DIPSTICK
Bilirubin, UA: NEGATIVE
Blood, UA: NEGATIVE
Glucose, UA: POSITIVE — AB
Ketones, UA: NEGATIVE
Leukocytes, UA: NEGATIVE
Nitrite, UA: NEGATIVE
Protein, UA: NEGATIVE
Spec Grav, UA: 1.015 (ref 1.010–1.025)
Urobilinogen, UA: 0.2 E.U./dL
pH, UA: 6 (ref 5.0–8.0)

## 2022-10-26 NOTE — Assessment & Plan Note (Addendum)
Has been on suppression with Keflex daily for years No local urologist - recently referred with appointment on 2/20 2 infections in the past 2 months with different species

## 2022-10-26 NOTE — Assessment & Plan Note (Signed)
Chronic condition, recurrent symptoms similar to prior episodes.  Denies any fevers or chills.  Updated UA and culture ordered today.  Previous culture reviewed, plan for Bactrim DS, patient requests evaluation by urology, referral placed today.  Advised close follow-up with PCP Dr. Army Melia.

## 2022-10-26 NOTE — Progress Notes (Signed)
Date:  10/26/2022   Name:  Samantha Welch   DOB:  06-24-1951   MRN:  [id number]   Chief Complaint: Urinary Tract Infection  Urinary Tract Infection  This is a recurrent problem. Pertinent negatives include no chills, frequency, hematuria or urgency.  UCx in Jan '24 - enterobacter R to Ceftin/ancef - treated with Bactrim UCx in Feb '24 - E Coli essentially pan-sensitive - treated with Bactrim  Finished Bactrim yesterday.  Feeling much better, drinking plenty of fluids.    Lab Results  Component Value Date   NA 135 09/21/2022   K 4.9 09/21/2022   CO2 24 09/21/2022   GLUCOSE 240 (H) 09/21/2022   BUN 13 09/21/2022   CREATININE 1.02 (H) 09/21/2022   CALCIUM 10.1 09/21/2022   EGFR 58 (L) 09/21/2022   GFRNONAA >60 05/19/2022   Lab Results  Component Value Date   CHOL 170 05/04/2022   HDL 46 05/04/2022   LDLCALC 84 05/04/2022   TRIG 198 (A) 05/04/2022   No results found for: "TSH" Lab Results  Component Value Date   HGBA1C 9.3 (H) 09/21/2022   Lab Results  Component Value Date   WBC 11.1 (H) 09/21/2022   HGB 15.4 09/21/2022   HCT 46.3 09/21/2022   MCV 93 09/21/2022   PLT 319 09/21/2022   Lab Results  Component Value Date   ALT 19 09/21/2022   AST 12 09/21/2022   ALKPHOS 53 09/21/2022   BILITOT 0.5 09/21/2022   No results found for: "25OHVITD2", "25OHVITD3", "VD25OH"   Review of Systems  Constitutional:  Negative for chills, fatigue and fever.  Respiratory:  Negative for cough, chest tightness and shortness of breath.   Genitourinary:  Negative for dysuria, frequency, hematuria and urgency.    Patient Active Problem List   Diagnosis Date Noted   Irritable bowel syndrome 09/21/2022   Tobacco abuse disorder 03/17/2021   History of kidney stones 12/25/2018   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/25/2018   Major depression single episode, in partial remission ([place]) 03/31/2015   Hyperlipidemia associated with type 2 diabetes mellitus ([place]) 07/25/2014   Type II diabetes mellitus with  complication ([place]) 0000000   GERD (gastroesophageal reflux disease) 04/23/2011    Allergies  Allergen Reactions   Venlafaxine Nausea Only    Past Surgical History:  Procedure Laterality Date   COLONOSCOPY WITH PROPOFOL N/A 10/05/2021   Procedure: COLONOSCOPY WITH PROPOFOL;  Surgeon: Annamaria Helling, DO;  Location: [facility] ENDOSCOPY;  Service: Gastroenterology;  Laterality: N/A;   CYSTOSCOPY W/ URETERAL STENT PLACEMENT Left 12/15/2015   Procedure: CYSTOSCOPY WITH STENT REPLACEMENT;  Surgeon: Hollice Espy, MD;  Location: [facility] ORS;  Service: Urology;  Laterality: Left;   CYSTOSCOPY WITH STENT PLACEMENT Left 11/16/2015   Procedure: CYSTOSCOPY WITH LEFT STENT PLACEMENT, LEFT RETROGRADE;  Surgeon: Dereck Leep, MD;  Location: [facility] ORS;  Service: Urology;  Laterality: Left;   FOOT SURGERY Bilateral    KNEE ARTHROSCOPY WITH MEDIAL MENISECTOMY Left 03/20/2020   Procedure: knee arthroscopic partial medial AND lateral meniscectomy;  Surgeon: Leim Fabry, MD;  Location: [place];  Service: Orthopedics;  Laterality: Left;  Diabetic - oral meds   TUBAL LIGATION     URETEROSCOPY WITH HOLMIUM LASER LITHOTRIPSY Left 12/15/2015   Procedure: URETEROSCOPY WITH HOLMIUM LASER LITHOTRIPSY;  Surgeon: Hollice Espy, MD;  Location: [facility] ORS;  Service: Urology;  Laterality: Left;    Social History   Tobacco Use   Smoking status: Every Day    Packs/day: 0.50    Years:  20.00    Total pack years: 10.00    Types: Cigarettes   Smokeless tobacco: Never  Vaping Use   Vaping Use: Never used  Substance Use Topics   Alcohol use: No    Alcohol/week: 0.0 standard drinks of alcohol   Drug use: No     Medication list has been reviewed and updated.  Current Meds  Medication Sig   aspirin 81 MG chewable tablet Chew 81 mg by mouth daily.   cephALEXin (KEFLEX) 250 MG capsule Take by mouth daily at 8 pm.   clonazePAM (KLONOPIN) 1 MG tablet Take 1 mg by mouth 2 (two) times daily.    dapagliflozin propanediol (FARXIGA) 5 MG TABS tablet Take 1 tablet (5 mg total) by mouth daily before breakfast.   dicyclomine (BENTYL) 10 MG capsule Take 10 mg by mouth 4 (four) times daily -  before meals and at bedtime.   gabapentin (NEURONTIN) 100 MG capsule Take 100 mg by mouth 3 (three) times daily.   meloxicam (MOBIC) 15 MG tablet Take 15 mg by mouth daily.   metFORMIN (GLUCOPHAGE) 1000 MG tablet Take 500 mg by mouth 2 (two) times daily with a meal.   ondansetron (ZOFRAN) 4 MG tablet Take 1 tablet (4 mg total) by mouth every 8 (eight) hours as needed for nausea or vomiting.   pantoprazole (PROTONIX) 40 MG tablet Take 40 mg by mouth at bedtime.   pravastatin (PRAVACHOL) 20 MG tablet Take 20 mg by mouth at bedtime.   sulfamethoxazole-trimethoprim (BACTRIM DS) 800-160 MG tablet Take 1 tablet by mouth 2 (two) times daily.   tiZANidine (ZANAFLEX) 4 MG tablet Take 1 tablet (4 mg total) by mouth every 6 (six) hours as needed for muscle spasms.   traMADol (ULTRAM) 50 MG tablet Take by mouth every 6 (six) hours as needed.   traZODone (DESYREL) 50 MG tablet Take 50-100 mg by mouth at bedtime.       10/26/2022   11:16 AM 09/21/2022    3:02 PM  GAD 7 : Generalized Anxiety Score  Nervous, Anxious, on Edge 3 3  Control/stop worrying 3 3  Worry too much - different things 3 3  Trouble relaxing 3 2  Restless 2 2  Easily annoyed or irritable 1 1  Afraid - awful might happen 1 1  Total GAD 7 Score 16 15  Anxiety Difficulty Not difficult at all Not difficult at all       10/26/2022   11:16 AM 09/21/2022    3:02 PM  Depression screen PHQ 2/9  Decreased Interest 1 2  Down, Depressed, Hopeless 1 1  PHQ - 2 Score 2 3  Altered sleeping 0 0  Tired, decreased energy 1 1  Change in appetite 0 1  Feeling bad or failure about yourself  0 0  Trouble concentrating 0 1  Moving slowly or fidgety/restless 1 1  Suicidal thoughts 0 0  PHQ-9 Score 4 7  Difficult doing work/chores Somewhat difficult  Somewhat difficult    BP Readings from Last 3 Encounters:  10/26/22 112/68  10/19/22 138/72  09/21/22 (!) 128/58    Physical Exam Vitals and nursing note reviewed.  Constitutional:      Appearance: She is well-developed.  Cardiovascular:     Rate and Rhythm: Normal rate and regular rhythm.     Heart sounds: Normal heart sounds.  Pulmonary:     Effort: Pulmonary effort is normal. No respiratory distress.     Breath sounds: Normal breath sounds.  Abdominal:  General: Bowel sounds are normal.     Palpations: Abdomen is soft.     Tenderness: There is no abdominal tenderness. There is no right CVA tenderness, left CVA tenderness, guarding or rebound.  Musculoskeletal:        General: Normal range of motion.     Right lower leg: No edema.     Left lower leg: No edema.   Urine dipstick shows positive for glucose.  Micro exam: not done.   Wt Readings from Last 3 Encounters:  10/26/22 152 lb (68.9 kg)  10/19/22 152 lb (68.9 kg)  09/21/22 156 lb 9.6 oz (71 kg)    BP 112/68   Pulse 68   Ht 5' 2"$  (1.575 m)   Wt 152 lb (68.9 kg)   SpO2 98%   BMI 27.80 kg/m   Assessment and Plan: Problem List Items Addressed This Visit       Genitourinary   Chronic UTI - Primary (Chronic)    Has been on suppression with Keflex daily for years No local urologist - recently referred with appointment on 2/20 2 infections in the past 2 months with different species      Relevant Orders   POCT Urinalysis Dipstick (Completed)     Partially dictated using Editor, commissioning. Any errors are unintentional.  Halina Maidens, MD [facility]  10/26/2022

## 2022-10-26 NOTE — Progress Notes (Signed)
     Primary Care / Sports Medicine Office Visit  Patient Information:  Patient ID: Samantha Welch, female DOB: 1951/02/22 Age: 72 y.o. MRN: [id number]   Samantha Welch is a pleasant 72 y.o. female presenting with the following:  Chief Complaint  Patient presents with   Urinary Tract Infection    Vitals:   10/19/22 1611  BP: 138/72  Pulse: 81  SpO2: 94%   Vitals:   10/19/22 1611  Weight: 152 lb (68.9 kg)  Height: '5\' 2"'$  (1.575 m)   Body mass index is 27.8 kg/m.  No results found.   Independent interpretation of notes and tests performed by another provider:   None  Procedures performed:   None  Pertinent History, Exam, Impression, and Recommendations:   Samantha Welch was seen today for urinary tract infection.  Chronic UTI (urinary tract infection) -     POCT urinalysis dipstick -     Urine Culture -     Ambulatory referral to Urology  Chronic UTI Assessment & Plan: Chronic condition, recurrent symptoms similar to prior episodes.  Denies any fevers or chills.  Updated UA and culture ordered today.  Previous culture reviewed, plan for Bactrim DS, patient requests evaluation by urology, referral placed today.  Advised close follow-up with PCP Dr. Army Melia.   Other orders -     Sulfamethoxazole-Trimethoprim; Take 1 tablet by mouth 2 (two) times daily.  Dispense: 14 tablet; Refill: 0 -     Ondansetron HCl; Take 1 tablet (4 mg total) by mouth every 8 (eight) hours as needed for nausea or vomiting.  Dispense: 20 tablet; Refill: 0     Orders & Medications Meds ordered this encounter  Medications   sulfamethoxazole-trimethoprim (BACTRIM DS) 800-160 MG tablet    Sig: Take 1 tablet by mouth 2 (two) times daily.    Dispense:  14 tablet    Refill:  0   ondansetron (ZOFRAN) 4 MG tablet    Sig: Take 1 tablet (4 mg total) by mouth every 8 (eight) hours as needed for nausea or vomiting.    Dispense:  20 tablet    Refill:  0   Orders Placed This Encounter  Procedures   Urine  Culture   Ambulatory referral to Urology   POCT Urinalysis Dipstick     Return in about 1 week (around 10/26/2022) for with Dr. Army Melia, UTI follow-up.     Montel Culver, MD, [facility]   Primary Care Sports Medicine Primary Care and Sports Medicine at [facility]

## 2022-11-02 ENCOUNTER — Other Ambulatory Visit
Admission: RE | Admit: 2022-11-02 | Discharge: 2022-11-02 | Disposition: A | Discharge status: Home / Self Care | Payer: 59 | Attending: Urology | Admitting: Urology

## 2022-11-02 ENCOUNTER — Other Ambulatory Visit: Payer: Self-pay

## 2022-11-02 ENCOUNTER — Encounter: Payer: Self-pay | Admitting: Urology

## 2022-11-02 ENCOUNTER — Ambulatory Visit (INDEPENDENT_AMBULATORY_CARE_PROVIDER_SITE_OTHER): Payer: 59 | Admitting: Urology

## 2022-11-02 VITALS — BP 144/75 | HR 73 | Ht 62.0 in | Wt 153.0 lb

## 2022-11-02 DIAGNOSIS — N200 Calculus of kidney: Secondary | ICD-10-CM

## 2022-11-02 DIAGNOSIS — R102 Pelvic and perineal pain: Secondary | ICD-10-CM

## 2022-11-02 DIAGNOSIS — Z87440 Personal history of urinary (tract) infections: Secondary | ICD-10-CM

## 2022-11-02 DIAGNOSIS — N390 Urinary tract infection, site not specified: Secondary | ICD-10-CM

## 2022-11-02 DIAGNOSIS — R300 Dysuria: Secondary | ICD-10-CM

## 2022-11-02 LAB — URINALYSIS, COMPLETE (UACMP) WITH MICROSCOPIC
Bilirubin Urine: NEGATIVE
Glucose, UA: 500 mg/dL — AB
Ketones, ur: NEGATIVE mg/dL
Leukocytes,Ua: NEGATIVE
Nitrite: NEGATIVE
Protein, ur: NEGATIVE mg/dL
Specific Gravity, Urine: 1.01 (ref 1.005–1.030)
pH: 5.5 (ref 5.0–8.0)

## 2022-11-02 LAB — BLADDER SCAN AMB NON-IMAGING

## 2022-11-02 MED ORDER — NITROFURANTOIN MONOHYD MACRO 100 MG PO CAPS: 100.0000 mg | ORAL_CAPSULE | Freq: Every day | ORAL | 0 refills | Status: DC

## 2022-11-02 MED ORDER — ESTRADIOL 0.1 MG/GM VA CREA: TOPICAL_CREAM | VAGINAL | 12 refills | Status: DC

## 2022-11-02 NOTE — Progress Notes (Signed)
11/02/22 4:07 PM   Samantha Welch 22-Jan-1951 [id number]  CC: Recurrent UTIs, history of nephrolithiasis  HPI: 72 year old female here today for recurrent UTIs.  She apparently had been having these for multiple years and had been on at least 2 years of daily Keflex prophylaxis which had resolved her infections, however since stopping that medication has had problems with recurrent culture documented E. coli UTIs.  Her UTI symptoms are pelvic pain and dysuria.  She is very frustrated with her recurrent infections.  She denies any gross hematuria not associated with UTI.  Urinalysis today benign except for yeast, she denies any UTI type symptoms today, PVR normal at 100 mL  PMH: Past Medical History:  Diagnosis Date   Anxiety    Arthritis    Cancer ([place])    skin ca on face   Chronic kidney disease    Left kidney stone   COPD (chronic obstructive pulmonary disease) (HCC)    Depression    Diabetes mellitus without complication (HCC)    GERD (gastroesophageal reflux disease)    Hyperlipemia    Stomach ulcer    Tobacco abuse disorder     Surgical History: Past Surgical History:  Procedure Laterality Date   COLONOSCOPY WITH PROPOFOL N/A 10/05/2021   Procedure: COLONOSCOPY WITH PROPOFOL;  Surgeon: Annamaria Helling, DO;  Location: [facility] ENDOSCOPY;  Service: Gastroenterology;  Laterality: N/A;   CYSTOSCOPY W/ URETERAL STENT PLACEMENT Left 12/15/2015   Procedure: CYSTOSCOPY WITH STENT REPLACEMENT;  Surgeon: Hollice Espy, MD;  Location: [facility] ORS;  Service: Urology;  Laterality: Left;   CYSTOSCOPY WITH STENT PLACEMENT Left 11/16/2015   Procedure: CYSTOSCOPY WITH LEFT STENT PLACEMENT, LEFT RETROGRADE;  Surgeon: Dereck Leep, MD;  Location: [facility] ORS;  Service: Urology;  Laterality: Left;   FOOT SURGERY Bilateral    KNEE ARTHROSCOPY WITH MEDIAL MENISECTOMY Left 03/20/2020   Procedure: knee arthroscopic partial medial AND lateral meniscectomy;  Surgeon: Leim Fabry, MD;   Location: [place];  Service: Orthopedics;  Laterality: Left;  Diabetic - oral meds   TUBAL LIGATION     URETEROSCOPY WITH HOLMIUM LASER LITHOTRIPSY Left 12/15/2015   Procedure: URETEROSCOPY WITH HOLMIUM LASER LITHOTRIPSY;  Surgeon: Hollice Espy, MD;  Location: [facility] ORS;  Service: Urology;  Laterality: Left;     Family History: Family History  Problem Relation Age of Onset   Colon cancer Brother    Bladder Cancer Neg Hx    Kidney cancer Neg Hx    Prostate cancer Neg Hx    Breast cancer Neg Hx     Social History:  reports that she has been smoking cigarettes. She has a 10.00 pack-year smoking history. She has never used smokeless tobacco. She reports that she does not drink alcohol and does not use drugs.  Physical Exam: BP (!) 144/75 (BP Location: Left Arm, Patient Position: Sitting, Cuff Size: Large)   Pulse 73   Ht 5' 2"$  (1.575 m)   Wt 153 lb (69.4 kg)   SpO2 97%   BMI 27.98 kg/m    Constitutional:  Alert and oriented, No acute distress. Cardiovascular: No clubbing, cyanosis, or edema. Respiratory: Normal respiratory effort, no increased work of breathing. GI: Abdomen is soft, nontender, nondistended, no abdominal masses   Pertinent Imaging: I have personally viewed and interpreted the CT abdomen and pelvis with contrast dated 05/19/2022 showing no urologic abnormalities.  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  We discussed the evaluation and treatment of patients with recurrent UTIs at length.  We specifically discussed the differences between asymptomatic bacteriuria and true urinary tract infection.  We discussed the AUA definition of recurrent UTI of at least 2 culture proven symptomatic acute cystitis episodes in a 32-monthperiod, or 3 within a 1 year period.  We discussed the importance of culture directed antibiotic treatment, and antibiotic stewardship.   First-line therapy includes nitrofurantoin(5 days), Bactrim(3 days), or fosfomycin(3 g single dose).  Possible etiologies of recurrent infection include periurethral tissue atrophy in postmenopausal woman, constipation, sexual activity, incomplete emptying, anatomic abnormalities, and even genetic predisposition.  Finally, we discussed the role of perineal hygiene, timed voiding, adequate hydration, topical vaginal estrogen, cranberry prophylaxis, and low-dose antibiotic prophylaxis.  Trial of topical estrogen cream, 90 days nitrofurantoin prophylaxis, cranberry tablets RTC 3 to 4 months symptom check  BNickolas Madrid MD 11/02/2022  [address] [place] [id number]([phone]

## 2022-11-19 ENCOUNTER — Telehealth: Payer: Self-pay | Admitting: Internal Medicine

## 2022-11-19 NOTE — Telephone Encounter (Signed)
Copied from [place] [id number]. Topic: Medicare AWV >> Nov 19, 2022 12:00 PM Devoria Glassing wrote: Reason for CRM: Called patient to schedule Medicare Annual Wellness Visit (AWV). Left message for patient to call back and schedule Medicare Annual Wellness Visit (AWV).  Last date of AWV: 07/02/20  Please schedule an appointment at any time with Kirke Shaggy, NHA  .  If any questions, please contact me.  Thank you ,  Sherol Dade; [place] Direct Dial: [phone]

## 2022-11-19 NOTE — Telephone Encounter (Signed)
[place] to schedule their annual wellness visit. Patient declined to schedule AWV at this time.AWV done by [employer] at home 3 months ago per patient  Sherol Dade; [place]: [phone]

## 2022-11-24 ENCOUNTER — Telehealth: Payer: Self-pay

## 2022-11-24 NOTE — Telephone Encounter (Signed)
Called pt left VM to call back to schedule AWV. Pt can do it as a telephone call.  KP

## 2022-12-20 NOTE — Telephone Encounter (Signed)
error 

## 2022-12-22 ENCOUNTER — Telehealth: Payer: Self-pay | Admitting: Internal Medicine

## 2022-12-22 ENCOUNTER — Telehealth: Payer: Self-pay

## 2022-12-22 NOTE — Telephone Encounter (Signed)
Called pt left VM to call back. Please transfer to [place] or [place]. AWV

## 2022-12-22 NOTE — Telephone Encounter (Signed)
Copied from CRM [id number]. Topic: General - Other >> Dec 22, 2022  2:30 PM Carrielelia G wrote: Reason patient returning call  [place] or [place]. AWV

## 2022-12-27 ENCOUNTER — Ambulatory Visit: Payer: Self-pay | Admitting: *Deleted

## 2022-12-27 NOTE — Telephone Encounter (Signed)
Called pt left VM to go to the ER. We will still keep pts appointment but she needs to be seen at the ER.  KP

## 2022-12-27 NOTE — Telephone Encounter (Signed)
  Chief Complaint: abdominal pain  Symptoms: moderate to severe at times abdominal pain "all over" shoots across abdomen. Nausea all of the time. Has medication that helps but sx return. Hx IBS. Nausea after eating anything. Can tolerate drinking fluids and drinks a lot of water.  Frequency: 3-4 weeks  Pertinent Negatives: Patient denies fever no chest pain no difficulty breathing reported.  Disposition: [] ED /[] Urgent Care (no appt availability in office) / [x] Appointment(In office/virtual)/ []  [facility]/ [] Home Care/ [] Refused Recommended Disposition /[] [facility]/ []  Follow-up with PCP Additional Notes:   Appt scheduled for 12/28/22. Patient would like appt today if available. On waitlist  Recommended if sx worsen go to ED. Has been seen in ED for same issues. Requesting GI referral    Reason for Disposition  [1] MODERATE pain (e.g., interferes with normal activities) AND [2] pain comes and goes (cramps) AND [3] present > 24 hours  (Exception: Pain with Vomiting or Diarrhea - see that Guideline.)  Answer Assessment - Initial Assessment Questions 1. LOCATION: "Where does it hurt?"      All over sharp pain 2. RADIATION: "Does the pain shoot anywhere else?" (e.g., chest, back)     Across stomach  3. ONSET: "When did the pain begin?" (e.g., minutes, hours or days ago)      3- 4 weeks ago  4. SUDDEN: "Gradual or sudden onset?"     On going 5. PATTERN "Does the pain come and go, or is it constant?"    - If it comes and goes: "How long does it last?" "Do you have pain now?"     (Note: Comes and goes means the pain is intermittent. It goes away completely between bouts.)    - If constant: "Is it getting better, staying the same, or getting worse?"      (Note: Constant means the pain never goes away completely; most serious pain is constant and gets worse.)      Comes and goes  6. SEVERITY: "How bad is the pain?"  (e.g., Scale 1-10; mild, moderate, or severe)    -  MILD (1-3): Doesn't interfere with normal activities, abdomen soft and not tender to touch.     - MODERATE (4-7): Interferes with normal activities or awakens from sleep, abdomen tender to touch.     - SEVERE (8-10): Excruciating pain, doubled over, unable to do any normal activities.       Moderate causes nausea  7. RECURRENT SYMPTOM: "Have you ever had this type of stomach pain before?" If Yes, ask: "When was the last time?" and "What happened that time?"      Yes  8. CAUSE: "What do you think is causing the stomach pain?"     Not sure  9. RELIEVING/AGGRAVATING FACTORS: "What makes it better or worse?" (e.g., antacids, bending or twisting motion, bowel movement)     Antacids stool softeners . 10. OTHER SYMPTOMS: "Do you have any other symptoms?" (e.g., back pain, diarrhea, fever, urination pain, vomiting)       Abdominal pain sharp pains, nausea after eating any food. Hx IBS , sometimes constipation sometimes diarrhea  11. PREGNANCY: "Is there any chance you are pregnant?" "When was your last menstrual period?"       na  Protocols used: Abdominal Pain - [facility]

## 2022-12-28 ENCOUNTER — Encounter: Payer: Self-pay | Admitting: Internal Medicine

## 2022-12-28 ENCOUNTER — Telehealth: Payer: Self-pay | Admitting: Internal Medicine

## 2022-12-28 ENCOUNTER — Ambulatory Visit (INDEPENDENT_AMBULATORY_CARE_PROVIDER_SITE_OTHER): Payer: 59

## 2022-12-28 ENCOUNTER — Ambulatory Visit (INDEPENDENT_AMBULATORY_CARE_PROVIDER_SITE_OTHER): Payer: 59 | Admitting: Internal Medicine

## 2022-12-28 ENCOUNTER — Other Ambulatory Visit: Payer: Self-pay | Admitting: Internal Medicine

## 2022-12-28 VITALS — BP 132/80 | HR 82 | Ht 62.0 in | Wt 151.0 lb

## 2022-12-28 DIAGNOSIS — K581 Irritable bowel syndrome with constipation: Secondary | ICD-10-CM | POA: Diagnosis not present

## 2022-12-28 DIAGNOSIS — E118 Type 2 diabetes mellitus with unspecified complications: Secondary | ICD-10-CM | POA: Diagnosis not present

## 2022-12-28 DIAGNOSIS — K589 Irritable bowel syndrome without diarrhea: Secondary | ICD-10-CM | POA: Diagnosis not present

## 2022-12-28 DIAGNOSIS — Z0000 Encounter for general adult medical examination without abnormal findings: Secondary | ICD-10-CM | POA: Diagnosis not present

## 2022-12-28 MED ORDER — DAPAGLIFLOZIN PROPANEDIOL 5 MG PO TABS
5.0000 mg | ORAL_TABLET | Freq: Every day | ORAL | 1 refills | Status: DC
Start: 2022-12-28 — End: 2023-03-23

## 2022-12-28 MED ORDER — PANTOPRAZOLE SODIUM 40 MG PO TBEC
40.0000 mg | DELAYED_RELEASE_TABLET | Freq: Every day | ORAL | 1 refills | Status: DC
Start: 2022-12-28 — End: 2023-05-11

## 2022-12-28 MED ORDER — ONDANSETRON HCL 4 MG PO TABS: 4.0000 mg | ORAL_TABLET | Freq: Three times a day (TID) | ORAL | 0 refills | Status: DC | PRN

## 2022-12-28 NOTE — Assessment & Plan Note (Signed)
With abdominal pain and constipation Continue Bentyl and Protonix Hold Miralax Samples of Trulance 3 mg given - take every day or every other day to effect Call for Rx if helpful

## 2022-12-28 NOTE — Assessment & Plan Note (Signed)
BS have been improving since adding [place] Will follow up with labs in 3 weeks

## 2022-12-28 NOTE — Telephone Encounter (Signed)
Copied from CRM [id number]. Topic: General - Other >> Dec 28, 2022 12:49 PM Everette C wrote: Reason for CRM: The patient has called to follow up on their previous request for a prescription for nausea medication   Please contact the patient further when possible

## 2022-12-28 NOTE — Progress Notes (Signed)
Subjective:   Samantha Welch is a 72 y.o. female who presents for Medicare Annual (Subsequent) preventive examination.  I connected with  [name] on 12/28/22 by an in person visit and verified that I am speaking with the correct person using two identifiers.  Patient Location: Other:  In office  Provider Location: Office/Clinic  Review of Systems    dEFER TO PCP  Cardiac Risk Factors include: diabetes mellitus;smoking/ tobacco exposure     Objective:    Today's Vitals   12/28/22 0907  PainSc: 6    There is no height or weight on file to calculate BMI.     12/28/2022    9:14 AM 05/19/2022    4:10 PM 10/05/2021    7:52 AM 03/20/2020    9:15 AM 11/20/2017    9:08 AM 10/26/2017   10:47 AM 03/31/2016    3:26 PM  Advanced Directives  Does Patient Have a Medical Advance Directive? No No No No No No No  Does patient want to make changes to medical advance directive?    Yes (MAU/Ambulatory/Procedural Areas - Information given)     Would patient like information on creating a medical advance directive? No - Patient declined      Yes - Educational materials given    Current Medications (verified) Outpatient Encounter Medications as of 12/28/2022  Medication Sig   aspirin 81 MG chewable tablet Chew 81 mg by mouth daily.   clonazePAM (KLONOPIN) 1 MG tablet Take 1 mg by mouth 2 (two) times daily.   dicyclomine (BENTYL) 10 MG capsule Take 10 mg by mouth 4 (four) times daily -  before meals and at bedtime.   estradiol (ESTRACE) 0.1 MG/GM vaginal cream Estrogen Cream Instruction Discard applicator Apply pea sized amount to tip of finger to urethra before bed. Wash hands well after application. Use Monday, Wednesday and Friday   gabapentin (NEURONTIN) 100 MG capsule Take 100 mg by mouth 3 (three) times daily.   meloxicam (MOBIC) 15 MG tablet Take 15 mg by mouth daily.   metFORMIN (GLUCOPHAGE) 1000 MG tablet Take 500 mg by mouth 2 (two) times daily with a meal.   nitrofurantoin,  macrocrystal-monohydrate, (MACROBID) 100 MG capsule Take 1 capsule (100 mg total) by mouth at bedtime.   ondansetron (ZOFRAN) 4 MG tablet Take 1 tablet (4 mg total) by mouth every 8 (eight) hours as needed for nausea or vomiting.   pravastatin (PRAVACHOL) 20 MG tablet Take 20 mg by mouth at bedtime.   tiZANidine (ZANAFLEX) 4 MG tablet Take 1 tablet (4 mg total) by mouth every 6 (six) hours as needed for muscle spasms.   traMADol (ULTRAM) 50 MG tablet Take by mouth every 6 (six) hours as needed.   traZODone (DESYREL) 50 MG tablet Take 50-100 mg by mouth at bedtime.   [DISCONTINUED] dapagliflozin propanediol (FARXIGA) 5 MG TABS tablet Take 1 tablet (5 mg total) by mouth daily before breakfast.   [DISCONTINUED] pantoprazole (PROTONIX) 40 MG tablet Take 40 mg by mouth at bedtime.   No facility-administered encounter medications on file as of 12/28/2022.    Allergies (verified) Venlafaxine   History: Past Medical History:  Diagnosis Date   Anxiety    Arthritis    Cancer    skin ca on face   Chronic kidney disease    Left kidney stone   COPD (chronic obstructive pulmonary disease)    Depression    Diabetes mellitus without complication    GERD (gastroesophageal reflux disease)  Hyperlipemia    Stomach ulcer    Tobacco abuse disorder    Past Surgical History:  Procedure Laterality Date   COLONOSCOPY WITH PROPOFOL N/A 10/05/2021   Procedure: COLONOSCOPY WITH PROPOFOL;  Surgeon: Jaynie Collins, DO;  [facility] ENDOSCOPY;  Service: Gastroenterology;  Laterality: N/A;   CYSTOSCOPY W/ URETERAL STENT PLACEMENT Left 12/15/2015   Procedure: CYSTOSCOPY WITH STENT REPLACEMENT;  Surgeon: Vanna Scotland, MD;  Location: [facility] ORS;  Service: Urology;  Laterality: Left;   CYSTOSCOPY WITH STENT PLACEMENT Left 11/16/2015   Procedure: CYSTOSCOPY WITH LEFT STENT PLACEMENT, LEFT RETROGRADE;  Surgeon: Eliot Ford, MD;  Location: [facility] ORS;  Service: Urology;  Laterality: Left;   FOOT  SURGERY Bilateral    KNEE ARTHROSCOPY WITH MEDIAL MENISECTOMY Left 03/20/2020   Procedure: knee arthroscopic partial medial AND lateral meniscectomy;  Surgeon: Signa Kell, MD;  Location: [facility];  Service: Orthopedics;  Laterality: Left;  Diabetic - oral meds   TUBAL LIGATION     URETEROSCOPY WITH HOLMIUM LASER LITHOTRIPSY Left 12/15/2015   Procedure: URETEROSCOPY WITH HOLMIUM LASER LITHOTRIPSY;  Surgeon: Vanna Scotland, MD;  Location: [facility] ORS;  Service: Urology;  Laterality: Left;   Family History  Problem Relation Age of Onset   Colon cancer Brother    Bladder Cancer Neg Hx    Kidney cancer Neg Hx    Prostate cancer Neg Hx    Breast cancer Neg Hx    Social History   Socioeconomic History   Marital status: Single    Spouse name: Not on file   Number of children: Not on file   Years of education: Not on file   Highest education level: Not on file  Occupational History   Not on file  Tobacco Use   Smoking status: Every Day    Packs/day: 0.50    Years: 20.00    Additional pack years: 0.00    Total pack years: 10.00    Types: Cigarettes   Smokeless tobacco: Never  Vaping Use   Vaping Use: Never used  Substance and Sexual Activity   Alcohol use: No    Alcohol/week: 0.0 standard drinks of alcohol   Drug use: No   Sexual activity: Not Currently  Other Topics Concern   Not on file  Social History Narrative   Not on file   Social Determinants of Health   Financial Resource Strain: Low Risk  (09/21/2022)   Overall Financial Resource Strain (CARDIA)    Difficulty of Paying Living Expenses: Not hard at all  Food Insecurity: No Food Insecurity (12/28/2022)   Hunger Vital Sign    Worried About Running Out of Food in the Last Year: Never true    Ran Out of Food in the Last Year: Never true  Transportation Needs: No Transportation Needs (09/21/2022)   PRAPARE - Administrator, Civil Service (Medical): No    Lack of Transportation (Non-Medical): No  Physical  Activity: Not on file  Stress: Not on file  Social Connections: Not on file    Tobacco Counseling Ready to quit: Not Answered Counseling given: Not Answered   Clinical Intake:  Pre-visit preparation completed: Yes  Pain : No/denies pain Pain Score: 6      BMI - recorded: 27.62 Nutritional Status: BMI 25 -29 Overweight Nutritional Risks: None Diabetes: Yes     Diabetic? Yes.  Interpreter Needed?: No  Information entered by :: Margaretha Sheffield, CMA   Activities of Daily Living    12/28/2022  9:15 AM  In your present state of health, do you have any difficulty performing the following activities:  Hearing? 0  Vision? 0  Difficulty concentrating or making decisions? 0  Walking or climbing stairs? 1  Dressing or bathing? 0  Doing errands, shopping? 0  Preparing Food and eating ? N  Using the Toilet? N  In the past six months, have you accidently leaked urine? N  Do you have problems with loss of bowel control? N  Managing your Medications? N  Managing your Finances? N  Housekeeping or managing your Housekeeping? N    Patient Care Team: Reubin Milan, MD as PCP - General (Internal Medicine) Lynett Fish, MD as Referring Physician (Psychiatry) Dedra Skeens, PA-C (Orthopedic Surgery) Verne Carrow, OD (Optometry)  Indicate any recent Medical Services you may have received from other than [facility] providers in the past year (date may be approximate).     Assessment:   This is a routine wellness examination for [facility].  Hearing/Vision screen Hearing Screening - Comments:: No concerns. Vision Screening - Comments:: No concerns.  Dietary issues and exercise activities discussed: Current Exercise Habits: The patient does not participate in regular exercise at present   Goals Addressed   None   Depression Screen    12/28/2022    8:57 AM 10/26/2022   11:16 AM 09/21/2022    3:02 PM  PHQ 2/9 Scores  PHQ - 2 Score PHQ- 9 Score Fall Risk     12/28/2022    8:57 AM 10/26/2022   11:16 AM 09/21/2022    3:02 PM  Fall Risk   Falls in the past year? 0 0 0  Number falls in past yr: 0 0 0  Injury with Fall? 0 0 0  Risk for fall due to : No Fall Risks History of fall(s) No Fall Risks  Follow up Falls evaluation completed Falls evaluation completed Falls evaluation completed    FALL RISK PREVENTION PERTAINING TO THE HOME:  Any stairs in or around the home? No  If so, are there any without handrails? No  Home free of loose throw rugs in walkways, pet beds, electrical cords, etc? Yes  Adequate lighting in your home to reduce risk of falls? Yes   ASSISTIVE DEVICES UTILIZED TO PREVENT FALLS:  Life alert? No  Use of a cane, walker or w/c? No   TIMED UP AND GO:  Was the test performed? Yes .  Gait slow and steady without use of assistive device  Cognitive Function:        Immunizations Immunization History  Administered Date(s) Administered   Influenza,inj,Quad PF,6+ Mos 07/19/2012, 06/20/2013, 07/25/2014, 08/18/2015, 06/17/2016, 06/14/2017, 07/17/2018, 06/28/2019   PNEUMOCOCCAL CONJUGATE-20 02/24/2022   Pneumococcal Conjugate-13 12/20/2016   Pneumococcal Polysaccharide-23 10/20/2011, 10/15/2013, 01/14/2015   Td (Adult),unspecified 04/29/1995   Tdap 02/24/2022   Zoster Recombinat (Shingrix) 02/24/2022, 04/27/2022    TDAP status: Up to date  Flu Vaccine status: Declined, Education has been provided regarding the importance of this vaccine but patient still declined. Advised may receive this vaccine at local pharmacy or Health Dept. Aware to provide a copy of the vaccination record if obtained from local pharmacy or Health Dept. Verbalized acceptance and understanding.  Pneumococcal vaccine status: Up to date  Covid-19 vaccine status: Declined, Education has been provided regarding the importance of this vaccine but patient still declined. Advised may receive this vaccine at local pharmacy or Health Dept.or vaccine clinic.  Aware to  provide a copy of the vaccination record if obtained from local pharmacy or Health Dept. Verbalized acceptance and understanding.  Qualifies for Shingles Vaccine? Yes   Zostavax completed Yes   Shingrix Completed?: Yes  Screening Tests Health Maintenance  Topic Date Due   COVID-19 Vaccine (1) Never done   OPHTHALMOLOGY EXAM  Never done   Hepatitis C Screening  Never done   Diabetic kidney evaluation - Urine ACR  01/02/2023   HEMOGLOBIN A1C  03/22/2023   INFLUENZA VACCINE  04/14/2023   Diabetic kidney evaluation - eGFR measurement  09/22/2023   FOOT EXAM  09/22/2023   Medicare Annual Wellness (AWV)  12/28/2023   MAMMOGRAM  05/31/2024   COLONOSCOPY (Pts 45-78yrs Insurance coverage will need to be confirmed)  10/06/2031   DTaP/Tdap/Td (2 - Td or Tdap) 02/25/2032   Pneumonia Vaccine 23+ Years old  Completed   DEXA SCAN  Completed   Zoster Vaccines- Shingrix  Completed   HPV VACCINES  Aged Out    Health Maintenance  Health Maintenance Due  Topic Date Due   COVID-19 Vaccine (1) Never done   OPHTHALMOLOGY EXAM  Never done   Hepatitis C Screening  Never done   Diabetic kidney evaluation - Urine ACR  01/02/2023    Colorectal cancer screening: Type of screening: Colonoscopy. Completed 10/05/2021. Repeat every 2-3 years  Mammogram status: Completed 05/31/2022. Repeat every year  Lung Cancer Screening: (Low Dose CT Chest recommended if Age 62-80 years, 30 pack-year currently smoking OR have quit w/in 15years.) does qualify.   Lung Cancer Screening Referral: Pt Declined  Additional Screening:  Hepatitis C Screening: does not qualify;  Vision Screening: Recommended annual ophthalmology exams for early detection of glaucoma and other disorders of the eye. Is the patient up to date with their annual eye exam?  No   Dental Screening: Recommended annual dental exams for proper oral hygiene  Community Resource Referral / Chronic Care Management: CRR required this visit?   No   CCM required this visit?  No      Plan:     I have personally reviewed and noted the following in the patient's chart:   Medical and social history Use of alcohol, tobacco or illicit drugs  Current medications and supplements including opioid prescriptions. Patient is not currently taking opioid prescriptions. Functional ability and status Nutritional status Physical activity Advanced directives List of other physicians Hospitalizations, surgeries, and ER visits in previous 12 months Vitals Screenings to include cognitive, depression, and falls Referrals and appointments  In addition, I have reviewed and discussed with patient certain preventive protocols, quality metrics, and best practice recommendations. A written personalized care plan for preventive services as well as general preventive health recommendations were provided to patient.     Mariel Sleet, CMA   12/28/2022   Nurse Notes: None.

## 2022-12-28 NOTE — Telephone Encounter (Signed)
Visit complete at [facility] today.

## 2022-12-28 NOTE — Progress Notes (Signed)
Date:  12/28/2022   Name:  Samantha Welch   DOB:  Mar 13, 1951   MRN:  [id number]   Chief Complaint: Abdominal Pain (Started 3-4 weeks ago. Bloated.Constipation on and off. )  Abdominal Pain This is a new problem. The current episode started more than 1 month ago. The problem occurs every several days. The problem has been waxing and waning. The pain is located in the generalized abdominal region. Associated symptoms include constipation and nausea. Pertinent negatives include no diarrhea, dysuria, fever or hematuria.  Diabetes She presents for her follow-up diabetic visit. She has type 2 diabetes mellitus. Her disease course has been improving. Pertinent negatives for hypoglycemia include no nervousness/anxiousness. Pertinent negatives for diabetes include no chest pain and no fatigue. Current diabetic treatment includes oral agent (dual therapy) (metformin with farxiga added last visit).   She has IBS and constipation with some nausea.  She is taking [place] and Bentyl regularly. She takes Miralax as needed. She has not tried Amitiza or Linzess.  Lab Results  Component Value Date   NA 135 09/21/2022   K 4.9 09/21/2022   CO2 24 09/21/2022   GLUCOSE 240 (H) 09/21/2022   BUN 13 09/21/2022   CREATININE 1.02 (H) 09/21/2022   CALCIUM 10.1 09/21/2022   EGFR 58 (L) 09/21/2022   GFRNONAA >60 05/19/2022   Lab Results  Component Value Date   CHOL 170 05/04/2022   HDL 46 05/04/2022   LDLCALC 84 05/04/2022   TRIG 198 (A) 05/04/2022   No results found for: "TSH" Lab Results  Component Value Date   HGBA1C 9.3 (H) 09/21/2022   Lab Results  Component Value Date   WBC 11.1 (H) 09/21/2022   HGB 15.4 09/21/2022   HCT 46.3 09/21/2022   MCV 93 09/21/2022   PLT 319 09/21/2022   Lab Results  Component Value Date   ALT 19 09/21/2022   AST 12 09/21/2022   ALKPHOS 53 09/21/2022   BILITOT 0.5 09/21/2022   No results found for: "25OHVITD2", "25OHVITD3", "VD25OH"   Review of Systems   Constitutional:  Positive for appetite change. Negative for diaphoresis, fatigue, fever and unexpected weight change.  HENT:  Negative for trouble swallowing.   Respiratory:  Negative for cough, chest tightness and shortness of breath.   Cardiovascular:  Negative for chest pain.  Gastrointestinal:  Positive for abdominal pain, constipation and nausea. Negative for diarrhea.  Genitourinary:  Negative for dysuria and hematuria.  Psychiatric/Behavioral:  Negative for dysphoric mood and sleep disturbance. The patient is not nervous/anxious.     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/31/2015   Hyperlipidemia associated with type 2 diabetes mellitus 07/25/2014   Type II diabetes mellitus with complication 07/25/2014   GERD (gastroesophageal reflux disease) 04/23/2011    Allergies  Allergen Reactions   Venlafaxine Nausea Only    Past Surgical History:  Procedure Laterality Date   COLONOSCOPY WITH PROPOFOL N/A 10/05/2021   Procedure: COLONOSCOPY WITH PROPOFOL;  Surgeon: Jaynie Collins, DO;  Location: [facility] ENDOSCOPY;  Service: Gastroenterology;  Laterality: N/A;   CYSTOSCOPY W/ URETERAL STENT PLACEMENT Left 12/15/2015   Procedure: CYSTOSCOPY WITH STENT REPLACEMENT;  Surgeon: Vanna Scotland, MD;  Location: [facility] ORS;  Service: Urology;  Laterality: Left;   CYSTOSCOPY WITH STENT PLACEMENT Left 11/16/2015  Procedure: CYSTOSCOPY WITH LEFT STENT PLACEMENT, LEFT RETROGRADE;  Surgeon: Eliot Ford, MD;  Location: [facility] ORS;  Service: Urology;  Laterality: Left;   FOOT SURGERY Bilateral    KNEE ARTHROSCOPY WITH MEDIAL MENISECTOMY Left 03/20/2020   Procedure: knee arthroscopic partial medial AND lateral meniscectomy;  Surgeon: Signa Kell, MD;  Location: [facility]ERY [facility];  Service: Orthopedics;  Laterality: Left;  Diabetic - oral meds   TUBAL LIGATION     URETEROSCOPY WITH HOLMIUM LASER LITHOTRIPSY Left 12/15/2015   Procedure: URETEROSCOPY WITH HOLMIUM LASER LITHOTRIPSY;  Surgeon: Vanna Scotland, MD;  Location: [facility] ORS;  Service: Urology;  Laterality: Left;    Social History   Tobacco Use   Smoking status: Every Day    Packs/day: 0.50    Years: 20.00    Additional pack years: 0.00    Total pack years: 10.00    Types: Cigarettes   Smokeless tobacco: Never  Vaping Use   Vaping Use: Never used  Substance Use Topics   Alcohol use: No    Alcohol/week: 0.0 standard drinks of alcohol   Drug use: No     Medication list has been reviewed and updated.  Current Meds  Medication Sig   aspirin 81 MG chewable tablet Chew 81 mg by mouth daily.   clonazePAM (KLONOPIN) 1 MG tablet Take 1 mg by mouth 2 (two) times daily.   dicyclomine (BENTYL) 10 MG capsule Take 10 mg by mouth 4 (four) times daily -  before meals and at bedtime.   estradiol (ESTRACE) 0.1 MG/GM vaginal cream Estrogen Cream Instruction Discard applicator Apply pea sized amount to tip of finger to urethra before bed. Wash hands well after application. Use Monday, Wednesday and Friday   gabapentin (NEURONTIN) 100 MG capsule Take 100 mg by mouth 3 (three) times daily.   meloxicam (MOBIC) 15 MG tablet Take 15 mg by mouth daily.   metFORMIN (GLUCOPHAGE) 1000 MG tablet Take 500 mg by mouth 2 (two) times daily with a meal.   nitrofurantoin, macrocrystal-monohydrate, (MACROBID) 100 MG capsule Take 1 capsule (100 mg total) by mouth at bedtime.   ondansetron (ZOFRAN) 4 MG tablet Take 1 tablet (4 mg total) by mouth every 8 (eight) hours as needed for nausea or vomiting.   pravastatin (PRAVACHOL) 20 MG tablet Take 20 mg by mouth at bedtime.   tiZANidine (ZANAFLEX) 4 MG tablet Take 1 tablet (4 mg total) by mouth every 6 (six) hours as needed for muscle spasms.   traMADol (ULTRAM) 50 MG tablet Take  by mouth every 6 (six) hours as needed.   traZODone (DESYREL) 50 MG tablet Take 50-100 mg by mouth at bedtime.   [DISCONTINUED] dapagliflozin propanediol (FARXIGA) 5 MG TABS tablet Take 1 tablet (5 mg total) by mouth daily before breakfast.   [DISCONTINUED] pantoprazole (PROTONIX) 40 MG tablet Take 40 mg by mouth at bedtime.       12/28/2022    8:57 AM 10/26/2022   11:16 AM 09/21/2022    3:02 PM  GAD 7 : Generalized Anxiety Score  Nervous, Anxious, on Edge Control/stop worrying Worry too much - different things Trouble relaxing Restless Easily annoyed or irritable Afraid - awful might happen Total GAD 7 Score Anxiety Difficulty Somewhat difficult Not difficult at all Not difficult at all       12/28/2022  8:57 AM 10/26/2022   11:16 AM 09/21/2022    3:02 PM  Depression screen PHQ 2/9  Decreased Interest 2 1 2   Down, Depressed, Hopeless 0 1 1  PHQ - 2 Score 2 2 3   Altered sleeping 1 0 0  Tired, decreased energy 1 1 1   Change in appetite 0 0 1  Feeling bad or failure about yourself  0 0 0  Trouble concentrating 0 0 1  Moving slowly or fidgety/restless 0 1 1  Suicidal thoughts 0 0 0  PHQ-9 Score 4 4 7   Difficult doing work/chores Not difficult at all Somewhat difficult Somewhat difficult    BP Readings from Last 3 Encounters:  12/28/22 132/80  11/02/22 (!) 144/75  10/26/22 112/68    Physical Exam Vitals and nursing note reviewed.  Constitutional:      General: She is not in acute distress.    Appearance: She is well-developed.  HENT:     Head: Normocephalic and atraumatic.  Pulmonary:     Effort: Pulmonary effort is normal. No respiratory distress.  Abdominal:     General: Abdomen is protuberant. Bowel sounds are decreased.     Palpations: Abdomen is soft. There is no hepatomegaly or splenomegaly.     Tenderness: There is generalized abdominal tenderness. There is no right CVA tenderness, left CVA  tenderness, guarding or rebound.  Skin:    General: Skin is warm and dry.     Findings: No rash.  Neurological:     Mental Status: She is alert and oriented to person, place, and time.  Psychiatric:        Mood and Affect: Mood normal.        Behavior: Behavior normal.     Wt Readings from Last 3 Encounters:  12/28/22 151 lb (68.5 kg)  11/02/22 153 lb (69.4 kg)  10/26/22 152 lb (68.9 kg)    BP 132/80   Pulse 82   Ht 5\' 2"  (1.575 m)   Wt 151 lb (68.5 kg)   SpO2 93%   BMI 27.62 kg/m   Assessment and Plan:  Problem List Items Addressed This Visit       Digestive   Irritable bowel syndrome with constipation - Primary    With abdominal pain and constipation Continue Bentyl and Protonix Hold Miralax Samples of Trulance 3 mg given - take every day or every other day to effect Call for Rx if helpful      Relevant Medications   pantoprazole (PROTONIX) 40 MG tablet     Endocrine   Type II diabetes mellitus with complication (Chronic)    BS have been improving since adding [place] Will follow up with labs in 3 weeks      Relevant Medications   dapagliflozin propanediol (FARXIGA) 5 MG TABS tablet    No follow-ups on file.   MAW completed today by CMA.  Partially dictated using Dragon software, any errors are not intentional.  Reubin Milan, MD [facility] [place], [place]

## 2023-01-10 ENCOUNTER — Ambulatory Visit
Admission: EM | Admit: 2023-01-10 | Discharge: 2023-01-10 | Disposition: A | Discharge status: Home / Self Care | Payer: 59 | Attending: Family Medicine | Admitting: Family Medicine

## 2023-01-10 ENCOUNTER — Encounter: Payer: Self-pay | Admitting: Emergency Medicine

## 2023-01-10 ENCOUNTER — Telehealth: Payer: Self-pay | Admitting: Internal Medicine

## 2023-01-10 ENCOUNTER — Other Ambulatory Visit: Payer: Self-pay | Admitting: Internal Medicine

## 2023-01-10 DIAGNOSIS — N390 Urinary tract infection, site not specified: Secondary | ICD-10-CM | POA: Diagnosis not present

## 2023-01-10 DIAGNOSIS — N3000 Acute cystitis without hematuria: Secondary | ICD-10-CM

## 2023-01-10 DIAGNOSIS — K581 Irritable bowel syndrome with constipation: Secondary | ICD-10-CM

## 2023-01-10 LAB — URINALYSIS, W/ REFLEX TO CULTURE (INFECTION SUSPECTED)
Bilirubin Urine: NEGATIVE
Glucose, UA: >1000 mg/dL — AB
Nitrite: POSITIVE — AB
Protein, ur: NEGATIVE mg/dL
Specific Gravity, Urine: 1.01 (ref 1.005–1.030)
WBC, UA: >50 WBC/hpf (ref 0–5)
pH: 5.5 (ref 5.0–8.0)

## 2023-01-10 MED ORDER — SULFAMETHOXAZOLE-TRIMETHOPRIM 800-160 MG PO TABS: 1.0000 | ORAL_TABLET | Freq: Two times a day (BID) | ORAL | 0 refills | Status: AC

## 2023-01-10 MED ORDER — TRULANCE 3 MG PO TABS
3.0000 mg | ORAL_TABLET | Freq: Every day | ORAL | 1 refills | Status: DC
Start: 2023-01-10 — End: 2023-07-04

## 2023-01-10 NOTE — Telephone Encounter (Signed)
Medication Refill - Medication: Trulance 3 mg   Has the patient contacted their pharmacy? No. (Agent: If no, request that the patient contact the pharmacy for the refill. If patient does not wish to contact the pharmacy document the reason why and proceed with request.) (Agent: If yes, when and what did the pharmacy advise?)  Preferred Pharmacy (with phone number or street name):  [facility] [address], [place] - [address] Phone: [phone]  Fax: [phone]     Has the patient been seen for an appointment in the last year OR does the patient have an upcoming appointment? Yes.    Agent: Please be advised that RX refills may take up to 3 business days. We ask that you follow-up with your pharmacy.  *Patient was given samples to try at last visit. Patient states the medication is working. Patient states she is taking medication every day.

## 2023-01-10 NOTE — Discharge Instructions (Signed)
Stop by the pharmacy to pick up your antibiotics. Follow up with your urologist as scheduled.

## 2023-01-10 NOTE — ED Provider Notes (Signed)
[facility]    CSN: [id number] Arrival date & time: 01/10/23  0820      History   Chief Complaint Chief Complaint  Patient presents with   Groin Pain   Urinary Frequency   Dysuria     HPI HPI Samantha Welch is a 72 y.o. female.    Samantha Welch presents for groin pain, abdominal pain with dysuria and urinary frequency for the past 2 days. Yesterday, it when she sat down.  Tried nothing prior to arrival.  Follows with urology and takes a medicine to prevent it at bedtime. She is postmenopausal.    - Abnormal vaginal discharge: no - vaginal bleeding: no - Dysuria: yes - Hematuria: no - Urinary urgency: no - Urinary frequency: yes  - Fever: no - Abdominal pain yes - Pelvic pain: no - Rash/Skin lesions/mouth ulcers: no - Nausea: yes - Vomiting: no  - Back Pain: no     Past Medical History:  Diagnosis Date   Anxiety    Arthritis    Cancer (HCC)    skin ca on face   Chronic kidney disease    Left kidney stone   COPD (chronic obstructive pulmonary disease) (HCC)    Depression    Diabetes mellitus without complication (HCC)    GERD (gastroesophageal reflux disease)    Hyperlipemia    Stomach ulcer    Tobacco abuse disorder     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   Major depression single episode, in partial remission (HCC) 03/31/2015   Hyperlipidemia associated with type 2 diabetes mellitus (HCC) 07/25/2014   Type II diabetes mellitus with complication (HCC) 07/25/2014   GERD (gastroesophageal reflux disease) 04/23/2011    Past Surgical History:  Procedure Laterality Date   COLONOSCOPY WITH PROPOFOL N/A 10/05/2021   Procedure: COLONOSCOPY WITH PROPOFOL;  Surgeon: Jaynie Collins, DO;  Location: [facility] ENDOSCOPY;  Service: Gastroenterology;  Laterality: N/A;    CYSTOSCOPY W/ URETERAL STENT PLACEMENT Left 12/15/2015   Procedure: CYSTOSCOPY WITH STENT REPLACEMENT;  Surgeon: Vanna Scotland, MD;  Location: [facility] ORS;  Service: Urology;  Laterality: Left;   CYSTOSCOPY WITH STENT PLACEMENT Left 11/16/2015   Procedure: CYSTOSCOPY WITH LEFT STENT PLACEMENT, LEFT RETROGRADE;  Surgeon: Eliot Ford, MD;  Location: [facility] ORS;  Service: Urology;  Laterality: Left;   FOOT SURGERY Bilateral    KNEE ARTHROSCOPY WITH MEDIAL MENISECTOMY Left 03/20/2020   Procedure: knee arthroscopic partial medial AND lateral meniscectomy;  Surgeon: Signa Kell, MD;  Location: [facility];  Service: Orthopedics;  Laterality: Left;  Diabetic - oral meds   TUBAL LIGATION     URETEROSCOPY WITH HOLMIUM LASER LITHOTRIPSY Left 12/15/2015   Procedure: URETEROSCOPY WITH HOLMIUM LASER LITHOTRIPSY;  Surgeon: Vanna Scotland, MD;  Location: [facility] ORS;  Service: Urology;  Laterality: Left;    OB History     Gravida  2   Para  2   Term  2   Preterm  0   AB  0   Living  0      SAB  0   IAB  0   Ectopic  0   Multiple  0   Live Births               Home Medications    Prior to Admission medications   Medication Sig  Start Date End Date Taking? Authorizing Provider  sulfamethoxazole-trimethoprim (BACTRIM DS) 800-160 MG tablet Take 1 tablet by mouth 2 (two) times daily for 10 days. 01/10/23 01/20/23 Yes Jeweldean Drohan, Seward Meth, DO  aspirin 81 MG chewable tablet Chew 81 mg by mouth daily.    [provider]  clonazePAM (KLONOPIN) 1 MG tablet Take 1 mg by mouth 2 (two) times daily.    [provider]  dapagliflozin propanediol (FARXIGA) 5 MG TABS tablet Take 1 tablet (5 mg total) by mouth daily before breakfast. 12/28/22   Reubin Milan, MD  dicyclomine (BENTYL) 10 MG capsule Take 10 mg by mouth 4 (four) times daily -  before meals and at bedtime.    [provider]  estradiol (ESTRACE) 0.1 MG/GM vaginal cream Estrogen Cream Instruction Discard  applicator Apply pea sized amount to tip of finger to urethra before bed. Wash hands well after application. Use Monday, Wednesday and Friday 11/02/22   Sondra Come, MD  gabapentin (NEURONTIN) 100 MG capsule Take 100 mg by mouth 3 (three) times daily.    [provider]  meloxicam (MOBIC) 15 MG tablet Take 15 mg by mouth daily.    [provider]  metFORMIN (GLUCOPHAGE) 1000 MG tablet Take 500 mg by mouth 2 (two) times daily with a meal. 10/05/15   [provider]  nitrofurantoin, macrocrystal-monohydrate, (MACROBID) 100 MG capsule Take 1 capsule (100 mg total) by mouth at bedtime. 11/02/22   Sondra Come, MD  ondansetron (ZOFRAN) 4 MG tablet Take 1 tablet (4 mg total) by mouth every 8 (eight) hours as needed for nausea or vomiting. 12/28/22   Reubin Milan, MD  pantoprazole (PROTONIX) 40 MG tablet Take 1 tablet (40 mg total) by mouth at bedtime. 12/28/22   Reubin Milan, MD  pravastatin (PRAVACHOL) 20 MG tablet Take 20 mg by mouth at bedtime. 11/03/15   [provider]  tiZANidine (ZANAFLEX) 4 MG tablet Take 1 tablet (4 mg total) by mouth every 6 (six) hours as needed for muscle spasms. 05/19/22   Wilman Tucker, Seward Meth, DO  traMADol (ULTRAM) 50 MG tablet Take by mouth every 6 (six) hours as needed.    [provider]  traZODone (DESYREL) 50 MG tablet Take 50-100 mg by mouth at bedtime.    [provider]    Family History Family History  Problem Relation Age of Onset   Colon cancer Brother    Bladder Cancer Neg Hx    Kidney cancer Neg Hx    Prostate cancer Neg Hx    Breast cancer Neg Hx     Social History Social History   Tobacco Use   Smoking status: Every Day    Packs/day: 0.50    Years: 20.00    Additional pack years: 0.00    Total pack years: 10.00    Types: Cigarettes   Smokeless tobacco: Never  Vaping Use   Vaping Use: Never used  Substance Use Topics   Alcohol use: No    Alcohol/week: 0.0 standard drinks of alcohol    Drug use: No     Allergies   Venlafaxine   Review of Systems Review of Systems: :negative unless otherwise stated in HPI.      Physical Exam Triage Vital Signs ED Triage Vitals  Enc Vitals Group     BP 01/10/23 0853 128/72     Pulse Rate 01/10/23 0853 60     Resp 01/10/23 0853 16     Temp 01/10/23 0853 98.2 F (  36.8 C)     Temp Source 01/10/23 0853 Oral     SpO2 01/10/23 0853 95 %     Weight --      Height --      Head Circumference --      Peak Flow --      Pain Score 01/10/23 0852 7     Pain Loc --      Pain Edu? --      Excl. in GC? --    No data found.  Updated Vital Signs BP 128/72 (BP Location: Left Arm)   Pulse 60   Temp 98.2 F (36.8 C) (Oral)   Resp 16   SpO2 95%   Visual Acuity Right Eye Distance:   Left Eye Distance:   Bilateral Distance:    Right Eye Near:   Left Eye Near:    Bilateral Near:     Physical Exam GEN: well appearing female in no acute distress  CVS: well perfused  RESP: speaking in full sentences without pause  ABD: soft, non-tender, non-distended, no palpable masses     UC Treatments / Results  Labs (all labs ordered are listed, but only abnormal results are displayed) Labs Reviewed  URINALYSIS, W/ REFLEX TO CULTURE (INFECTION SUSPECTED) - Abnormal; Notable for the following components:      Result Value   APPearance CLOUDY (*)    Glucose, UA >1,000 (*)    Hgb urine dipstick TRACE (*)    Ketones, ur TRACE (*)    Nitrite POSITIVE (*)    Leukocytes,Ua SMALL (*)    Bacteria, UA MANY (*)    All other components within normal limits  URINE CULTURE    EKG   Radiology No results found.  Procedures Procedures (including critical care time)  Medications Ordered in UC Medications - No data to display  Initial Impression / Assessment and Plan / UC Course  I have reviewed the triage vital signs and the nursing notes.  Pertinent labs & imaging results that were available during my care of the patient were  reviewed by me and considered in my medical decision making (see chart for details).     Acute cystitis:  Patient is a 72 y.o. female  who presents for acute onset dysuria and urinary frequency.  Overall patient is well-appearing and afebrile.  Vital signs stable.  UA consistent with acute cystitis.  Hematuria not supported on microscopy.  She has history of recurrent UTIs and follow with urology for this.  On chart review, pt saw urologist Dr Dawayne Patricia in Feb 2024 for recurrent UTIs.  Pt here today with acute on chronic UTI. Based on her last culture she has been  Treat with Bactrim BID for 10 days.  Urine culture obtained.  Follow-up sensitivities and change antibiotics, if needed.   Discussed MDM, treatment plan and plan for follow-up with patient who agrees with plan.        Final Clinical Impressions(s) / UC Diagnoses   Final diagnoses:  Recurrent urinary tract infection     Discharge Instructions      Stop by the pharmacy to pick up your antibiotics. Follow up with your urologist as scheduled.      ED Prescriptions     Medication Sig Dispense Auth. Provider   sulfamethoxazole-trimethoprim (BACTRIM DS) 800-160 MG tablet Take 1 tablet by mouth 2 (two) times daily for 10 days. 20 tablet Katha Cabal, DO      PDMP not reviewed this encounter.   Viyan Rosamond,  Seward Meth, DO 01/10/23 1610

## 2023-01-10 NOTE — Telephone Encounter (Signed)
Please review. Not on pts medication list.  KP

## 2023-01-10 NOTE — ED Triage Notes (Signed)
Pt c/o groin pain, abdominal pain, urinary frequency and dysuria x 2 days.

## 2023-01-11 LAB — URINE CULTURE

## 2023-01-12 LAB — URINE CULTURE: Culture: >=100000 — AB

## 2023-01-21 ENCOUNTER — Telehealth: Payer: Self-pay

## 2023-01-21 ENCOUNTER — Encounter: Payer: Self-pay | Admitting: Internal Medicine

## 2023-01-21 ENCOUNTER — Ambulatory Visit (INDEPENDENT_AMBULATORY_CARE_PROVIDER_SITE_OTHER): Payer: 59 | Admitting: Internal Medicine

## 2023-01-21 VITALS — BP 122/58 | HR 66 | Ht 62.0 in | Wt 149.0 lb

## 2023-01-21 DIAGNOSIS — Z7984 Long term (current) use of oral hypoglycemic drugs: Secondary | ICD-10-CM | POA: Diagnosis not present

## 2023-01-21 DIAGNOSIS — E118 Type 2 diabetes mellitus with unspecified complications: Secondary | ICD-10-CM

## 2023-01-21 DIAGNOSIS — Z1159 Encounter for screening for other viral diseases: Secondary | ICD-10-CM | POA: Diagnosis not present

## 2023-01-21 DIAGNOSIS — N390 Urinary tract infection, site not specified: Secondary | ICD-10-CM

## 2023-01-21 NOTE — Assessment & Plan Note (Signed)
She recently started macrobid daily then quickly had a UTI with a resistant species. She has now completed Bactrim for 10 day. Recommend resuming Macrobid

## 2023-01-21 NOTE — Progress Notes (Signed)
Date:  01/21/2023   Name:  Samantha Welch   DOB:  [birth year]   MRN:  [id number]   Chief Complaint: Diabetes  Diabetes She presents for her follow-up diabetic visit. She has type 2 diabetes mellitus. Disease course: very high A1C last visit. Pertinent negatives for hypoglycemia include no headaches, nervousness/anxiousness or tremors. Pertinent negatives for diabetes include no chest pain, no fatigue, no polydipsia and no polyuria. Current diabetic treatments: metformin and Farxiga added last visit.    Lab Results  Component Value Date   NA 135 09/21/2022   K 4.9 09/21/2022   CO2 24 09/21/2022   GLUCOSE 240 (H) 09/21/2022   BUN 13 09/21/2022   CREATININE 1.02 (H) 09/21/2022   CALCIUM 10.1 09/21/2022   EGFR 58 (L) 09/21/2022   GFRNONAA >60 05/19/2022   Lab Results  Component Value Date   CHOL 170 05/04/2022   HDL 46 05/04/2022   LDLCALC 84 05/04/2022   TRIG 198 (A) 05/04/2022   No results found for: "TSH" Lab Results  Component Value Date   HGBA1C 9.3 (H) 09/21/2022   Lab Results  Component Value Date   WBC 11.1 (H) 09/21/2022   HGB 15.4 09/21/2022   HCT 46.3 09/21/2022   MCV 93 09/21/2022   PLT 319 09/21/2022   Lab Results  Component Value Date   ALT 19 09/21/2022   AST 12 09/21/2022   ALKPHOS 53 09/21/2022   BILITOT 0.5 09/21/2022   No results found for: "25OHVITD2", "25OHVITD3", "VD25OH"   Review of Systems  Constitutional:  Negative for appetite change, fatigue, fever and unexpected weight change.  HENT:  Negative for tinnitus and trouble swallowing.   Eyes:  Negative for visual disturbance.  Respiratory:  Negative for cough, chest tightness and shortness of breath.   Cardiovascular:  Negative for chest pain, palpitations and leg swelling.  Gastrointestinal:  Negative for abdominal pain.  Endocrine: Negative for polydipsia and polyuria.  Genitourinary:  Negative for dysuria and hematuria.  Musculoskeletal:  Negative for arthralgias.  Neurological:   Negative for tremors, numbness and headaches.  Psychiatric/Behavioral:  Negative for dysphoric mood and sleep disturbance. The patient is not nervous/anxious.     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   Major depression single episode, in partial remission (HCC) 03/31/2015   Hyperlipidemia associated with type 2 diabetes mellitus (HCC) 07/25/2014   Type II diabetes mellitus with complication (HCC) 07/25/2014   GERD (gastroesophageal reflux disease) 04/23/2011    Allergies  Allergen Reactions   Venlafaxine Nausea Only    Past Surgical History:  Procedure Laterality Date   COLONOSCOPY WITH PROPOFOL N/A 10/05/2021   Procedure: COLONOSCOPY WITH PROPOFOL;  Surgeon: Jaynie Collins, DO;  Location: [facility] ENDOSCOPY;  Service: Gastroenterology;  Laterality: N/A;   CYSTOSCOPY W/ URETERAL STENT PLACEMENT Left 12/15/2015   Procedure: CYSTOSCOPY WITH STENT REPLACEMENT;  Surgeon: Vanna Scotland, MD;  Location: [facility] ORS;  Service: Urology;  Laterality: Left;   CYSTOSCOPY WITH STENT PLACEMENT Left 11/16/2015   Procedure: CYSTOSCOPY WITH LEFT STENT PLACEMENT, LEFT RETROGRADE;  Surgeon: Eliot Ford, MD;  Location: [facility] ORS;  Service: Urology;  Laterality: Left;   FOOT SURGERY Bilateral    KNEE ARTHROSCOPY WITH MEDIAL MENISECTOMY Left 03/20/2020   Procedure: knee arthroscopic partial medial AND lateral meniscectomy;  Surgeon: Signa Kell, MD;  Location: [facility];  Service: Orthopedics;  Laterality: Left;  Diabetic - oral meds   TUBAL LIGATION     URETEROSCOPY WITH HOLMIUM LASER LITHOTRIPSY Left 12/15/2015   Procedure: URETEROSCOPY WITH HOLMIUM LASER LITHOTRIPSY;  Surgeon: Vanna Scotland, MD;  Location: [facility] ORS;  Service: Urology;  Laterality: Left;    Social History   Tobacco Use   Smoking status: Every Day     Packs/day: 0.50    Years: 20.00    Additional pack years: 0.00    Total pack years: 10.00    Types: Cigarettes   Smokeless tobacco: Never  Vaping Use   Vaping Use: Never used  Substance Use Topics   Alcohol use: No    Alcohol/week: 0.0 standard drinks of alcohol   Drug use: No     Medication list has been reviewed and updated.  Current Meds  Medication Sig   aspirin 81 MG chewable tablet Chew 81 mg by mouth daily.   clonazePAM (KLONOPIN) 1 MG tablet Take 1 mg by mouth 2 (two) times daily.   dapagliflozin propanediol (FARXIGA) 5 MG TABS tablet Take 1 tablet (5 mg total) by mouth daily before breakfast.   dicyclomine (BENTYL) 10 MG capsule Take 10 mg by mouth 4 (four) times daily -  before meals and at bedtime.   estradiol (ESTRACE) 0.1 MG/GM vaginal cream Estrogen Cream Instruction Discard applicator Apply pea sized amount to tip of finger to urethra before bed. Wash hands well after application. Use Monday, Wednesday and Friday   gabapentin (NEURONTIN) 100 MG capsule Take 100 mg by mouth 3 (three) times daily.   meloxicam (MOBIC) 15 MG tablet Take 15 mg by mouth daily.   metFORMIN (GLUCOPHAGE) 1000 MG tablet Take 500 mg by mouth 2 (two) times daily with a meal.   nitrofurantoin, macrocrystal-monohydrate, (MACROBID) 100 MG capsule Take 1 capsule (100 mg total) by mouth at bedtime.   ondansetron (ZOFRAN) 4 MG tablet Take 1 tablet (4 mg total) by mouth every 8 (eight) hours as needed for nausea or vomiting.   pantoprazole (PROTONIX) 40 MG tablet Take 1 tablet (40 mg total) by mouth at bedtime.   Plecanatide (TRULANCE) 3 MG TABS Take 1 tablet (3 mg total) by mouth daily at 6 (six) AM.   pravastatin (PRAVACHOL) 20 MG tablet Take 20 mg by mouth at bedtime.   tiZANidine (ZANAFLEX) 4 MG tablet Take 1 tablet (4 mg total) by mouth every 6 (six) hours as needed for muscle spasms.   traMADol (ULTRAM) 50 MG tablet Take by mouth every 6 (six) hours as needed.   traZODone (DESYREL) 50 MG tablet  Take 50-100 mg by mouth at bedtime.       01/21/2023    1:22 PM 12/28/2022    8:57 AM 10/26/2022   11:16 AM 09/21/2022    3:02 PM  GAD 7 : Generalized Anxiety Score  Nervous, Anxious, on Edge 2 2 3 3   Control/stop worrying 2 2 3 3   Worry too much - different things 2 2 3 3   Trouble relaxing 2 2 3 2   Restless 2 2 2 2   Easily annoyed or irritable 2 2 1 1   Afraid - awful might happen 2 2 1 1   Total GAD 7 Score 14 14 16 15   Anxiety Difficulty Somewhat difficult Somewhat difficult Not difficult at all Not difficult at all       01/21/2023    1:21 PM 12/28/2022    8:57 AM 10/26/2022   11:16 AM  Depression screen PHQ 2/9  Decreased  Interest 3 2 1   Down, Depressed, Hopeless 3 0 1  PHQ - 2 Score 6 2 2   Altered sleeping 3 1 0  Tired, decreased energy 1 1 1   Change in appetite 0 0 0  Feeling bad or failure about yourself  0 0 0  Trouble concentrating 0 0 0  Moving slowly or fidgety/restless 0 0 1  Suicidal thoughts 0 0 0  PHQ-9 Score 10 4 4   Difficult doing work/chores Somewhat difficult Not difficult at all Somewhat difficult    BP Readings from Last 3 Encounters:  01/21/23 (!) 122/58  01/10/23 128/72  12/28/22 132/80    Physical Exam Vitals and nursing note reviewed.  Constitutional:      General: She is not in acute distress.    Appearance: She is well-developed.  HENT:     Head: Normocephalic and atraumatic.  Cardiovascular:     Rate and Rhythm: Normal rate and regular rhythm.  Pulmonary:     Effort: Pulmonary effort is normal. No respiratory distress.     Breath sounds: No wheezing or rhonchi.  Musculoskeletal:     Cervical back: Normal range of motion.     Right lower leg: No edema.     Left lower leg: No edema.  Lymphadenopathy:     Cervical: No cervical adenopathy.  Skin:    General: Skin is warm and dry.     Capillary Refill: Capillary refill takes less than 2 seconds.     Findings: No rash.  Neurological:     Mental Status: She is alert and oriented to  person, place, and time.  Psychiatric:        Mood and Affect: Mood normal.        Behavior: Behavior normal.     Wt Readings from Last 3 Encounters:  01/21/23 149 lb (67.6 kg)  12/28/22 151 lb (68.5 kg)  11/02/22 153 lb (69.4 kg)    BP (!) 122/58   Pulse 66   Ht 5\' 2"  (1.575 m)   Wt 149 lb (67.6 kg)   SpO2 97%   BMI 27.25 kg/m   Assessment and Plan:  Problem List Items Addressed This Visit       Endocrine   Type II diabetes mellitus with complication (HCC) - Primary (Chronic)    Blood sugars improving without hypoglycemic symptoms or events. Currently being treated with metformin and [place]. Lab Results  Component Value Date   HGBA1C 9.3 (H) 09/21/2022        Relevant Orders   Basic metabolic panel   Hemoglobin A1c   Microalbumin / creatinine urine ratio     Genitourinary   Chronic UTI (Chronic)    She recently started macrobid daily then quickly had a UTI with a resistant species. She has now completed Bactrim for 10 day. Recommend resuming Macrobid       Other Visit Diagnoses     Need for hepatitis C screening test       Relevant Orders   Hepatitis C antibody       Return in about 4 months (around 05/24/2023) for DM, HTN.   Partially dictated using Dragon software, any errors are not intentional.  Reubin Milan, MD [facility] [place], [place]

## 2023-01-21 NOTE — Telephone Encounter (Signed)
Completed PA on [email or web address] for Farixga. Medication is on patients list of covered Drugs.   Samantha Welch ([employer]) PA Case ID #: [id number] Need Help? Call us at [phone]  Outcome Additional Information Required This medication or product is on your plan's list of covered drugs. Prior authorization is not required at this time. If your pharmacy has questions regarding the processing of your prescription, please have them call the [facility] pharmacy help desk at [phone]. **Please note: This request was submitted electronically. Formulary lowering, tiering exception, cost reduction and/or pre-benefit determination review (including prospective Medicare hospice reviews) requests cannot be requested using this method of submission. Providers contact us at [phone] for further assistance.

## 2023-01-21 NOTE — Assessment & Plan Note (Signed)
Blood sugars improving without hypoglycemic symptoms or events. Currently being treated with metformin and [place]. Lab Results  Component Value Date   HGBA1C 9.3 (H) 09/21/2022

## 2023-01-22 LAB — BASIC METABOLIC PANEL
BUN/Creatinine Ratio: 12 (ref 12–28)
BUN: 15 mg/dL (ref 8–27)
CO2: 21 mmol/L (ref 20–29)
Calcium: 9.6 mg/dL (ref 8.7–10.3)
Chloride: 97 mmol/L (ref 96–106)
Creatinine, Ser: 1.21 mg/dL — ABNORMAL HIGH (ref 0.57–1.00)
Glucose: 144 mg/dL — ABNORMAL HIGH (ref 70–99)
Potassium: 4.9 mmol/L (ref 3.5–5.2)
Sodium: 137 mmol/L (ref 134–144)
eGFR: 47 mL/min/{1.73_m2} — ABNORMAL LOW (ref >59)

## 2023-01-22 LAB — MICROALBUMIN / CREATININE URINE RATIO
Creatinine, Urine: 59.6 mg/dL
Microalb/Creat Ratio: <5 mg/g creat (ref 0–29)
Microalbumin, Urine: <3 ug/mL

## 2023-01-22 LAB — HEMOGLOBIN A1C
Est. average glucose Bld gHb Est-mCnc: 169 mg/dL
Hgb A1c MFr Bld: 7.5 % — ABNORMAL HIGH (ref 4.8–5.6)

## 2023-01-22 LAB — HEPATITIS C ANTIBODY: Hep C Virus Ab: NONREACTIVE

## 2023-01-26 ENCOUNTER — Other Ambulatory Visit: Payer: Self-pay | Admitting: Urology

## 2023-01-26 DIAGNOSIS — N390 Urinary tract infection, site not specified: Secondary | ICD-10-CM

## 2023-01-29 NOTE — Progress Notes (Unsigned)
01/31/2023 4:15 PM   Samantha Welch 1951/08/03 [id number]  Referring provider: Reubin Milan, MD [address] [place],  [place] [address]  Urological history: 1. Nephrolithiasis - stone composition of 3% calcium oxalate dihydrate, 77% calcium oxalate monohydrate and 10% calcium phosphate stone -left URS (2017) - contrast CT (05/2022) - no stones   2. rUTI's -contributing factors of age, GSM, diabetes and constipation  01/10/2023 Hafnia alvei  10/19/2022 E.coli  09/21/2022 Enterobacter cloacae  complex -vaginal estrogen cream, cranberry tablets and nitrofurantoin  No chief complaint on file.   HPI: Samantha Welch is a 72 y.o. female who presents today for three month follow up.   Previous records reviewed.   At her visit on 02/202/2024,  she was seen by Dr. Richardo Hanks for rUTI's and started on cranberry tablets, vaginal estrogen cream and nitrofurantoin.      PMH: Past Medical History:  Diagnosis Date   Anxiety    Arthritis    Cancer (HCC)    skin ca on face   Chronic kidney disease    Left kidney stone   COPD (chronic obstructive pulmonary disease) (HCC)    Depression    Diabetes mellitus without complication (HCC)    GERD (gastroesophageal reflux disease)    Hyperlipemia    Stomach ulcer    Tobacco abuse disorder     Surgical History: Past Surgical History:  Procedure Laterality Date   COLONOSCOPY WITH PROPOFOL N/A 10/05/2021   Procedure: COLONOSCOPY WITH PROPOFOL;  Surgeon: Jaynie Collins, DO;  Location: [facility] ENDOSCOPY;  Service: Gastroenterology;  Laterality: N/A;   CYSTOSCOPY W/ URETERAL STENT PLACEMENT Left 12/15/2015   Procedure: CYSTOSCOPY WITH STENT REPLACEMENT;  Surgeon: Vanna Scotland, MD;  Location: [facility] ORS;  Service: Urology;  Laterality: Left;   CYSTOSCOPY WITH STENT PLACEMENT Left 11/16/2015   Procedure: CYSTOSCOPY WITH LEFT STENT PLACEMENT, LEFT RETROGRADE;  Surgeon: Eliot Ford, MD;  Location: [facility] ORS;  Service:  Urology;  Laterality: Left;   FOOT SURGERY Bilateral    KNEE ARTHROSCOPY WITH MEDIAL MENISECTOMY Left 03/20/2020   Procedure: knee arthroscopic partial medial AND lateral meniscectomy;  Surgeon: Signa Kell, MD;  Location: [facility];  Service: Orthopedics;  Laterality: Left;  Diabetic - oral meds   TUBAL LIGATION     URETEROSCOPY WITH HOLMIUM LASER LITHOTRIPSY Left 12/15/2015   Procedure: URETEROSCOPY WITH HOLMIUM LASER LITHOTRIPSY;  Surgeon: Vanna Scotland, MD;  Location: [facility] ORS;  Service: Urology;  Laterality: Left;    Home Medications:  Allergies as of 01/31/2023       Reactions   Venlafaxine Nausea Only        Medication List        Accurate as of Jan 29, 2023  4:15 PM. If you have any questions, ask your nurse or doctor.          aspirin 81 MG chewable tablet Chew 81 mg by mouth daily.   clonazePAM 1 MG tablet Commonly known as: KLONOPIN Take 1 mg by mouth 2 (two) times daily.   dapagliflozin propanediol 5 MG Tabs tablet Commonly known as: Farxiga Take 1 tablet (5 mg total) by mouth daily before breakfast.   dicyclomine 10 MG capsule Commonly known as: BENTYL Take 10 mg by mouth 4 (four) times daily -  before meals and at bedtime.   estradiol 0.1 MG/GM vaginal cream Commonly known as: ESTRACE Estrogen Cream Instruction Discard applicator Apply pea sized amount to tip of finger to urethra before bed. Wash hands well  after application. Use Monday, Wednesday and Friday   gabapentin 100 MG capsule Commonly known as: NEURONTIN Take 100 mg by mouth 3 (three) times daily.   meloxicam 15 MG tablet Commonly known as: MOBIC Take 15 mg by mouth daily.   metFORMIN 1000 MG tablet Commonly known as: GLUCOPHAGE Take 500 mg by mouth 2 (two) times daily with a meal.   nitrofurantoin (macrocrystal-monohydrate) 100 MG capsule Commonly known as: MACROBID Take 1 capsule (100 mg total) by mouth at bedtime.   ondansetron 4 MG tablet Commonly known as:  Zofran Take 1 tablet (4 mg total) by mouth every 8 (eight) hours as needed for nausea or vomiting.   pantoprazole 40 MG tablet Commonly known as: PROTONIX Take 1 tablet (40 mg total) by mouth at bedtime.   pravastatin 20 MG tablet Commonly known as: PRAVACHOL Take 20 mg by mouth at bedtime.   tiZANidine 4 MG tablet Commonly known as: Zanaflex Take 1 tablet (4 mg total) by mouth every 6 (six) hours as needed for muscle spasms.   traMADol 50 MG tablet Commonly known as: ULTRAM Take by mouth every 6 (six) hours as needed.   traZODone 50 MG tablet Commonly known as: DESYREL Take 50-100 mg by mouth at bedtime.   Trulance 3 MG Tabs Generic drug: Plecanatide Take 1 tablet (3 mg total) by mouth daily at 6 (six) AM.        Allergies:  Allergies  Allergen Reactions   Venlafaxine Nausea Only    Family History: Family History  Problem Relation Age of Onset   Colon cancer Brother    Bladder Cancer Neg Hx    Kidney cancer Neg Hx    Prostate cancer Neg Hx    Breast cancer Neg Hx     Social History:  reports that she has been smoking cigarettes. She has a 10.00 pack-year smoking history. She has never used smokeless tobacco. She reports that she does not drink alcohol and does not use drugs.  ROS: Pertinent ROS in HPI  Physical Exam: There were no vitals taken for this visit.  Constitutional:  Well nourished. Alert and oriented, No acute distress. HEENT: [place] AT, moist mucus membranes.  Trachea midline, no masses. Cardiovascular: No clubbing, cyanosis, or edema. Respiratory: Normal respiratory effort, no increased work of breathing. GU: No CVA tenderness.  No bladder fullness or masses. Vulvovaginal atrophy w/ pallor, loss of rugae, introital retraction, excoriations.  Vulvar thinning, fusion of labia, clitoral hood retraction, prominent urethral meatus.   *** external genitalia, *** pubic hair distribution, no lesions.  Normal urethral meatus, no lesions, no prolapse, no  discharge.   No urethral masses, tenderness and/or tenderness. No bladder fullness, tenderness or masses. *** vagina mucosa, *** estrogen effect, no discharge, no lesions, *** pelvic support, *** cystocele and *** rectocele noted.  No cervical motion tenderness.  Uterus is freely mobile and non-fixed.  No adnexal/parametria masses or tenderness noted.  Anus and perineum are without rashes or lesions.   ***  Neurologic: Grossly intact, no focal deficits, moving all 4 extremities. Psychiatric: Normal mood and affect.    Laboratory Data: Lab Results  Component Value Date   WBC 11.1 (H) 09/21/2022   HGB 15.4 09/21/2022   HCT 46.3 09/21/2022   MCV 93 09/21/2022   PLT 319 09/21/2022   Lab Results  Component Value Date   CREATININE 1.21 (H) 01/21/2023   Lab Results  Component Value Date   HGBA1C 7.5 (H) 01/21/2023      Component Value  Date/Time   CHOL 170 05/04/2022 0000   HDL 46 05/04/2022 0000   LDLCALC 84 05/04/2022 0000   Lab Results  Component Value Date   AST 12 09/21/2022   Lab Results  Component Value Date   ALT 19 09/21/2022   Urinalysis    Component Value Date/Time   COLORURINE YELLOW 01/10/2023 0856   APPEARANCEUR CLOUDY (A) 01/10/2023 0856   APPEARANCEUR Clear 02/10/2016 1535   LABSPEC 1.010 01/10/2023 0856   PHURINE 5.5 01/10/2023 0856   GLUCOSEU >1,000 (A) 01/10/2023 0856   HGBUR TRACE (A) 01/10/2023 0856   BILIRUBINUR NEGATIVE 01/10/2023 0856   BILIRUBINUR neg 10/26/2022 1123   BILIRUBINUR Negative 02/10/2016 1535   KETONESUR TRACE (A) 01/10/2023 0856   PROTEINUR NEGATIVE 01/10/2023 0856   UROBILINOGEN 0.2 10/26/2022 1123   NITRITE POSITIVE (A) 01/10/2023 0856   LEUKOCYTESUR SMALL (A) 01/10/2023 0856  I have reviewed the labs.   Pertinent Imaging: N/A  Assessment & Plan:    1. rUTI's - criteria for recurrent UTI has been met with 2 or more infections in 6 months or 3 or greater infections in one year  - patient is instructed to increase their  water intake until the urine is pale yellow or clear (10 to 12 cups daily) *** - patient is instructed to take probiotics (yogurt, oral pills or vaginal suppositories), take cranberry pills or drink the juice and Vitamin C 1,000 mg daily to acidify the urine *** - avoid soaking in tubs and wipe front to back after urinating ***  2. Nephrolithiasis ***                                            No follow-ups on file.  These notes generated with voice recognition software. I apologize for typographical errors.  Cloretta Ned  [facility] [address]  [address] [place], [place] [address] [phone]

## 2023-01-31 ENCOUNTER — Encounter: Payer: Self-pay | Admitting: Urology

## 2023-01-31 ENCOUNTER — Ambulatory Visit (INDEPENDENT_AMBULATORY_CARE_PROVIDER_SITE_OTHER): Payer: 59 | Admitting: Urology

## 2023-01-31 VITALS — BP 132/96 | HR 70 | Ht 62.0 in | Wt 149.0 lb

## 2023-01-31 DIAGNOSIS — N390 Urinary tract infection, site not specified: Secondary | ICD-10-CM

## 2023-01-31 DIAGNOSIS — N952 Postmenopausal atrophic vaginitis: Secondary | ICD-10-CM | POA: Diagnosis not present

## 2023-01-31 DIAGNOSIS — Z87440 Personal history of urinary (tract) infections: Secondary | ICD-10-CM | POA: Diagnosis not present

## 2023-01-31 DIAGNOSIS — N200 Calculus of kidney: Secondary | ICD-10-CM

## 2023-02-22 LAB — HM DIABETES EYE EXAM

## 2023-03-04 ENCOUNTER — Telehealth: Payer: Self-pay | Admitting: Internal Medicine

## 2023-03-04 NOTE — Telephone Encounter (Signed)
Copied from CRM [id number]. Topic: General - Other >> Mar 04, 2023  1:32 PM Samantha Welch wrote: Reason for CRM: Pt stated she was told that the results of her eye exam would be sent to her eye doctor but she was told that they have not received it. Pt requests that the results of her eye exam be sent to her eye doctor but she did not have the name or contact number of the eye doctor.

## 2023-03-08 ENCOUNTER — Ambulatory Visit: Payer: Self-pay | Admitting: *Deleted

## 2023-03-08 ENCOUNTER — Encounter: Payer: Self-pay | Admitting: Internal Medicine

## 2023-03-08 ENCOUNTER — Ambulatory Visit (INDEPENDENT_AMBULATORY_CARE_PROVIDER_SITE_OTHER): Payer: 59 | Admitting: Internal Medicine

## 2023-03-08 VITALS — BP 128/70 | HR 70 | Ht 62.0 in | Wt 148.0 lb

## 2023-03-08 DIAGNOSIS — N390 Urinary tract infection, site not specified: Secondary | ICD-10-CM | POA: Diagnosis not present

## 2023-03-08 DIAGNOSIS — B379 Candidiasis, unspecified: Secondary | ICD-10-CM

## 2023-03-08 MED ORDER — NITROFURANTOIN MONOHYD MACRO 100 MG PO CAPS: 100.0000 mg | ORAL_CAPSULE | Freq: Every day | ORAL | 0 refills | Status: DC

## 2023-03-08 MED ORDER — FLUCONAZOLE 100 MG PO TABS
100.0000 mg | ORAL_TABLET | ORAL | 0 refills | Status: AC
Start: 2023-03-08 — End: 2023-03-11

## 2023-03-08 NOTE — Assessment & Plan Note (Addendum)
UA today is negative except for glucose Continue estrace vaginal cream Resume Macrobid daily Schedule follow up with Urology to evaluate for other causes of bladder pain/spasm

## 2023-03-08 NOTE — Patient Instructions (Signed)
Team Member Role and Visual merchandiser Info Address Start End Comments  PCPs        Reubin Milan, MD General (Internal Medicine) Phone: [phone] Fax: [phone] [address] [place] [address] 10/19/2022 - -  Additional Team Members           [address] [place] [address] 09/21/2022 - -  Sondra Come, MD Consulting Physician (Urology) Phone: [phone] Fax: [phone] [address] [place] [address] 03/08/2023 - Bonnielee Haff [address] [facility] [place] [address] 09/21/2022 - -     [address] Ervin Knack [place] [address] 09/21/2022 - -

## 2023-03-08 NOTE — Telephone Encounter (Signed)
Summary: severe lower abdominal   Stated having lower abdominal pain with burning. Stated urologist gave her mediation she just finished it; however, she is still having severe pain. Pt requesting to drop off urine sample stated not feeling well.  Seeking clinical advice.          Chief Complaint:  low abdominal pain with burning urinating  Symptoms: completed medication prescribed by urologist and still having sx burning with urination low abdominal pain  Frequency: na  Pertinent Negatives: Patient denies fever no blood in urine  Disposition: [] ED /[] Urgent Care (no appt availability in office) / [x] Appointment(In office/virtual)/ []  [facility]/ [] Home Care/ [] Refused Recommended Disposition /[] [facility]/ []  Follow-up with PCP Additional Notes:   Appt today . Requesting she can call urologist back as well regarding medication request. Appt scheduled today with PCP. Can not drop sample off at practice     Reason for Disposition  [1] MODERATE pain (e.g., interferes with normal activities) AND [2] pain comes and goes (cramps) AND [3] present > 24 hours  (Exception: Pain with Vomiting or Diarrhea - see that Guideline.)  Side (flank) or lower back pain present  Answer Assessment - Initial Assessment Questions 1. LOCATION: "Where does it hurt?"      Low abdomen and vaginal area burning with urination 2. RADIATION: "Does the pain shoot anywhere else?" (e.g., chest, back)     Na  3. ONSET: "When did the pain begin?" (e.g., minutes, hours or days ago)      Started after completing medication from urologist.  4. SUDDEN: "Gradual or sudden onset?"     Na  5. PATTERN "Does the pain come and go, or is it constant?"    - If it comes and goes: "How long does it last?" "Do you have pain now?"     (Note: Comes and goes means the pain is intermittent. It goes away completely between bouts.)    - If constant: "Is it getting better, staying the same, or getting worse?"       (Note: Constant means the pain never goes away completely; most serious pain is constant and gets worse.)      Starting back with pain since completed course of medication from urologist 6. SEVERITY: "How bad is the pain?"  (e.g., Scale 1-10; mild, moderate, or severe)    - MILD (1-3): Doesn't interfere with normal activities, abdomen soft and not tender to touch.     - MODERATE (4-7): Interferes with normal activities or awakens from sleep, abdomen tender to touch.     - SEVERE (8-10): Excruciating pain, doubled over, unable to do any normal activities.       Burning with urintation 7. RECURRENT SYMPTOM: "Have you ever had this type of stomach pain before?" If Yes, ask: "When was the last time?" and "What happened that time?"      Yes  8. CAUSE: "What do you think is causing the stomach pain?"     UTI 9. RELIEVING/AGGRAVATING FACTORS: "What makes it better or worse?" (e.g., antacids, bending or twisting motion, bowel movement)     Na  10. OTHER SYMPTOMS: "Do you have any other symptoms?" (e.g., back pain, diarrhea, fever, urination pain, vomiting)       Low abdominal pain back pain nausea, burning with urination 11. PREGNANCY: "Is there any chance you are pregnant?" "When was your last menstrual period?"       na  Answer Assessment - Initial Assessment Questions 1. SYMPTOM: "What's  the main symptom you're concerned about?" (e.g., frequency, incontinence)     Burning with urination low abdominal pain 2. ONSET: "When did the  sx  start?"     Since completing medication from urologist 3. PAIN: "Is there any pain?" If Yes, ask: "How bad is it?" (Scale: 1-10; mild, moderate, severe)     Yes  4. CAUSE: "What do you think is causing the symptoms?"     Not sure maybe UTI 5. OTHER SYMPTOMS: "Do you have any other symptoms?" (e.g., blood in urine, fever, flank pain, pain with urination)     Low abdominal pain , burning with urination 6. PREGNANCY: "Is there any chance you are pregnant?" "When  was your last menstrual period?"     Na  Protocols used: Abdominal Pain - Female-A-AH, Urinary Symptoms-A-AH

## 2023-03-08 NOTE — Progress Notes (Signed)
Date:  03/08/2023   Name:  Samantha Welch   DOB:  11-04-1950   MRN:  [id number]   Chief Complaint: Recurrent UTI (Vaginal burning, and lower groin pain. Started  3 days ago. Today it was worse and she decided to make an appointment. )  Abdominal Pain This is a recurrent problem. Episode onset: three days ago. The onset quality is undetermined. The problem occurs constantly. The problem has been unchanged. The pain is located in the suprapubic region and LLQ. The pain is mild. The quality of the pain is cramping and a sensation of fullness. The abdominal pain does not radiate. Pertinent negatives include no belching, constipation, diarrhea, dysuria, fever or frequency. Associated symptoms comments: Sharp pain at the urethral - not during urination. Treatments tried: daily macrobid for recurrent UTI - ran out.    Lab Results  Component Value Date   NA 137 01/21/2023   K 4.9 01/21/2023   CO2 21 01/21/2023   GLUCOSE 144 (H) 01/21/2023   BUN 15 01/21/2023   CREATININE 1.21 (H) 01/21/2023   CALCIUM 9.6 01/21/2023   EGFR 47 (L) 01/21/2023   GFRNONAA >60 05/19/2022   Lab Results  Component Value Date   CHOL 170 05/04/2022   HDL 46 05/04/2022   LDLCALC 84 05/04/2022   TRIG 198 (A) 05/04/2022   No results found for: "TSH" Lab Results  Component Value Date   HGBA1C 7.5 (H) 01/21/2023   Lab Results  Component Value Date   WBC 11.1 (H) 09/21/2022   HGB 15.4 09/21/2022   HCT 46.3 09/21/2022   MCV 93 09/21/2022   PLT 319 09/21/2022   Lab Results  Component Value Date   ALT 19 09/21/2022   AST 12 09/21/2022   ALKPHOS 53 09/21/2022   BILITOT 0.5 09/21/2022   No results found for: "25OHVITD2", "25OHVITD3", "VD25OH"   Review of Systems  Constitutional:  Positive for fatigue. Negative for chills, fever and unexpected weight change.  Respiratory:  Negative for chest tightness and shortness of breath.   Cardiovascular:  Negative for chest pain.  Gastrointestinal:  Positive for  abdominal pain. Negative for constipation and diarrhea.  Genitourinary:  Negative for dysuria, frequency, vaginal discharge and vaginal pain.  Psychiatric/Behavioral:  Negative for dysphoric mood and sleep disturbance. The patient is not nervous/anxious.     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   Recurrent UTI 02/10/2016   Major depression single episode, in partial remission (HCC) 03/31/2015   Hyperlipidemia associated with type 2 diabetes mellitus (HCC) 07/25/2014   Type II diabetes mellitus with complication (HCC) 07/25/2014   GERD (gastroesophageal reflux disease) 04/23/2011    Allergies  Allergen Reactions   Venlafaxine Nausea Only    Past Surgical History:  Procedure Laterality Date   COLONOSCOPY WITH PROPOFOL N/A 10/05/2021   Procedure: COLONOSCOPY WITH PROPOFOL;  Surgeon: Jaynie Collins, DO;  Location: [facility];  Service: Gastroenterology;  Laterality: N/A;   CYSTOSCOPY W/ URETERAL STENT PLACEMENT Left 12/15/2015   Procedure: CYSTOSCOPY WITH STENT REPLACEMENT;  Surgeon: Vanna Scotland, MD;  Location: [facility];  Service: Urology;  Laterality: Left;   CYSTOSCOPY WITH STENT PLACEMENT Left 11/16/2015   Procedure: CYSTOSCOPY WITH LEFT STENT PLACEMENT, LEFT RETROGRADE;  Surgeon: Eliot Ford, MD;  Location: [facility];  Service: Urology;  Laterality: Left;   FOOT SURGERY  Bilateral    KNEE ARTHROSCOPY WITH MEDIAL MENISECTOMY Left 03/20/2020   Procedure: knee arthroscopic partial medial AND lateral meniscectomy;  Surgeon: Signa Kell, MD;  Location: [facility];  Service: Orthopedics;  Laterality: Left;  Diabetic - oral meds   TUBAL LIGATION     URETEROSCOPY WITH HOLMIUM LASER LITHOTRIPSY Left 12/15/2015   Procedure: URETEROSCOPY WITH HOLMIUM LASER LITHOTRIPSY;  Surgeon: Vanna Scotland, MD;   Location: [facility];  Service: Urology;  Laterality: Left;    Social History   Tobacco Use   Smoking status: Every Day    Packs/day: 0.50    Years: 20.00    Additional pack years: 0.00    Total pack years: 10.00    Types: Cigarettes   Smokeless tobacco: Never  Vaping Use   Vaping Use: Never used  Substance Use Topics   Alcohol use: No    Alcohol/week: 0.0 standard drinks of alcohol   Drug use: No     Medication list has been reviewed and updated.  Current Meds  Medication Sig   aspirin 81 MG chewable tablet Chew 81 mg by mouth daily.   clonazePAM (KLONOPIN) 1 MG tablet Take 1 mg by mouth 2 (two) times daily.   dapagliflozin propanediol (FARXIGA) 5 MG TABS tablet Take 1 tablet (5 mg total) by mouth daily before breakfast.   dicyclomine (BENTYL) 10 MG capsule Take 10 mg by mouth 4 (four) times daily -  before meals and at bedtime.   estradiol (ESTRACE) 0.1 MG/GM vaginal cream Estrogen Cream Instruction Discard applicator Apply pea sized amount to tip of finger to urethra before bed. Wash hands well after application. Use Monday, Wednesday and Friday   fluconazole (DIFLUCAN) 100 MG tablet Take 1 tablet (100 mg total) by mouth every other day for 3 days.   gabapentin (NEURONTIN) 100 MG capsule Take 100 mg by mouth 3 (three) times daily. Pt states 1 at bedtime   meloxicam (MOBIC) 15 MG tablet Take 15 mg by mouth daily.   metFORMIN (GLUCOPHAGE) 1000 MG tablet Take 500 mg by mouth 2 (two) times daily with a meal.   ondansetron (ZOFRAN) 4 MG tablet Take 1 tablet (4 mg total) by mouth every 8 (eight) hours as needed for nausea or vomiting.   pantoprazole (PROTONIX) 40 MG tablet Take 1 tablet (40 mg total) by mouth at bedtime.   Plecanatide (TRULANCE) 3 MG TABS Take 1 tablet (3 mg total) by mouth daily at 6 (six) AM.   pravastatin (PRAVACHOL) 20 MG tablet Take 20 mg by mouth at bedtime.   tiZANidine (ZANAFLEX) 4 MG tablet Take 1 tablet (4 mg total) by mouth every 6 (six) hours as needed  for muscle spasms.   traMADol (ULTRAM) 50 MG tablet Take by mouth every 6 (six) hours as needed.   traZODone (DESYREL) 50 MG tablet Take 50-100 mg by mouth at bedtime.       01/21/2023    1:22 PM 12/28/2022    8:57 AM 10/26/2022   11:16 AM 09/21/2022    3:02 PM  GAD 7 : Generalized Anxiety Score  Nervous, Anxious, on Edge 2 2 3 3   Control/stop worrying 2 2 3 3   Worry too much - different things 2 2 3 3   Trouble relaxing 2 2 3 2   Restless 2 2 2 2   Easily annoyed or irritable 2 2 1 1   Afraid - awful might happen 2 2 1 1   Total GAD 7 Score 14 14 16 15   Anxiety Difficulty Somewhat difficult  Somewhat difficult Not difficult at all Not difficult at all       01/21/2023    1:21 PM 12/28/2022    8:57 AM 10/26/2022   11:16 AM  Depression screen PHQ 2/9  Decreased Interest 3 2 1   Down, Depressed, Hopeless 3 0 1  PHQ - 2 Score 6 2 2   Altered sleeping 3 1 0  Tired, decreased energy 1 1 1   Change in appetite 0 0 0  Feeling bad or failure about yourself  0 0 0  Trouble concentrating 0 0 0  Moving slowly or fidgety/restless 0 0 1  Suicidal thoughts 0 0 0  PHQ-9 Score 10 4 4   Difficult doing work/chores Somewhat difficult Not difficult at all Somewhat difficult    BP Readings from Last 3 Encounters:  03/08/23 128/70  01/31/23 (!) 132/96  01/21/23 (!) 122/58    Physical Exam Vitals and nursing note reviewed.  Constitutional:      General: She is not in acute distress.    Appearance: She is well-developed.  HENT:     Head: Normocephalic and atraumatic.  Cardiovascular:     Rate and Rhythm: Normal rate and regular rhythm.  Pulmonary:     Effort: Pulmonary effort is normal. No respiratory distress.     Breath sounds: No wheezing or rhonchi.  Abdominal:     General: Abdomen is flat. Bowel sounds are normal.     Palpations: Abdomen is soft.     Tenderness: There is abdominal tenderness in the suprapubic area and left lower quadrant. There is no right CVA tenderness, left CVA  tenderness, guarding or rebound.  Skin:    General: Skin is warm and dry.     Findings: No rash.  Neurological:     Mental Status: She is alert and oriented to person, place, and time.  Psychiatric:        Mood and Affect: Mood normal.        Behavior: Behavior normal.     Wt Readings from Last 3 Encounters:  03/08/23 148 lb (67.1 kg)  01/31/23 149 lb (67.6 kg)  01/21/23 149 lb (67.6 kg)    BP 128/70   Pulse 70   Ht 5\' 2"  (1.575 m)   Wt 148 lb (67.1 kg)   SpO2 97%   BMI 27.07 kg/m   Assessment and Plan:  Problem List Items Addressed This Visit     Recurrent UTI    UA today is negative except for glucose Continue estrace vaginal cream Resume Macrobid daily Schedule follow up with Urology to evaluate for other causes of bladder pain/spasm      Relevant Medications   fluconazole (DIFLUCAN) 100 MG tablet   nitrofurantoin, macrocrystal-monohydrate, (MACROBID) 100 MG capsule   Other Visit Diagnoses     Yeast infection    -  Primary   possible candida genital symptoms as she takes [place] will treat with diflucan qod x 3 doses   Relevant Medications   fluconazole (DIFLUCAN) 100 MG tablet   nitrofurantoin, macrocrystal-monohydrate, (MACROBID) 100 MG capsule       No follow-ups on file.   Partially dictated using Dragon software, any errors are not intentional.  Reubin Milan, MD [facility] [place], [place]

## 2023-03-08 NOTE — Telephone Encounter (Signed)
Noted   Pt has an appt today.  KP

## 2023-03-14 LAB — HM DIABETES EYE EXAM

## 2023-03-23 ENCOUNTER — Other Ambulatory Visit: Payer: Self-pay

## 2023-03-23 ENCOUNTER — Telehealth: Payer: Self-pay | Admitting: Internal Medicine

## 2023-03-23 DIAGNOSIS — E118 Type 2 diabetes mellitus with unspecified complications: Secondary | ICD-10-CM

## 2023-03-23 MED ORDER — FARXIGA 5 MG PO TABS
5.0000 mg | ORAL_TABLET | Freq: Every day | ORAL | 1 refills | Status: DC
Start: 2023-03-23 — End: 2023-10-25

## 2023-03-23 NOTE — Telephone Encounter (Signed)
Changed to farxiga DAW.

## 2023-03-23 NOTE — Telephone Encounter (Signed)
Homero Fellers from [facility] Member services called to report that [facility] will be covering the brand name FARXIGA and not the generic dapagliflozin propanediol (FARXIGA) 5 MG TABS tablet    Please advise

## 2023-04-19 ENCOUNTER — Encounter: Payer: Self-pay | Admitting: Internal Medicine

## 2023-04-27 ENCOUNTER — Telehealth: Payer: Self-pay | Admitting: Internal Medicine

## 2023-04-27 ENCOUNTER — Other Ambulatory Visit: Payer: Self-pay

## 2023-04-27 MED ORDER — PRAVASTATIN SODIUM 20 MG PO TABS: 20.0000 mg | ORAL_TABLET | Freq: Every day | ORAL | 1 refills | Status: DC

## 2023-04-27 MED ORDER — METFORMIN HCL 1000 MG PO TABS: 500.0000 mg | ORAL_TABLET | Freq: Two times a day (BID) | ORAL | 1 refills | Status: DC

## 2023-04-27 MED ORDER — ONDANSETRON HCL 4 MG PO TABS: 4.0000 mg | ORAL_TABLET | Freq: Three times a day (TID) | ORAL | 1 refills | Status: DC | PRN

## 2023-04-27 NOTE — Telephone Encounter (Signed)
Copied from CRM [id number]. Topic: General - Other >> Apr 27, 2023  9:48 AM Dondra Prader A wrote: Reason for CRM: Pt called in regarding needing a refill for: metFORMIN (GLUCOPHAGE) 1000 MG tablet pravastatin (PRAVACHOL) 20 MG tablet  ondansetron (ZOFRAN) 4 MG tablet Pt states per [facility] they have sent over the request for the refill and have not heard anything from the office and if they do not get a response back they are going to cancel her delivery services. Pt did not know the phone number or address for [facility] and it is not listed in her chart as one of her pharmacies. I called the office and spoke with Marchelle Folks, they did receive the fax and the PCP will work on the refill. There is not address or phone number on the fax to add to the system. >> Apr 27, 2023  9:55 AM Franchot Heidelberg wrote: Pt called to share the phone number for [facility]. [phone]  Says provider must call or they will close the request.   She says she needs these medications prescribed for 90 day supply

## 2023-04-27 NOTE — Telephone Encounter (Signed)
Refills sent to [facility].

## 2023-05-11 ENCOUNTER — Other Ambulatory Visit: Payer: Self-pay | Admitting: Internal Medicine

## 2023-05-11 DIAGNOSIS — K589 Irritable bowel syndrome without diarrhea: Secondary | ICD-10-CM

## 2023-05-11 NOTE — Telephone Encounter (Signed)
Medication Refill - Medication:  pantoprazole (PROTONIX) 40 MG tablet  Has the patient contacted their pharmacy? Yes.   (She said she got all her other meds yesterday, and this was the only thing missing.  Preferred Pharmacy (with phone number or street name): [facility]) - [place], [place] - [address] [[id number]]  Has the patient been seen for an appointment in the last year OR does the patient have an upcoming appointment? Yes.    Agent: Please be advised that RX refills may take up to 3 business days. We ask that you follow-up with your pharmacy.

## 2023-05-12 MED ORDER — PANTOPRAZOLE SODIUM 40 MG PO TBEC
40.0000 mg | DELAYED_RELEASE_TABLET | Freq: Every day | ORAL | 0 refills | Status: DC
Start: 2023-05-12 — End: 2023-07-14

## 2023-05-12 NOTE — Telephone Encounter (Signed)
Requested Prescriptions  Pending Prescriptions Disp Refills   pantoprazole (PROTONIX) 40 MG tablet 90 tablet 0    Sig: Take 1 tablet (40 mg total) by mouth at bedtime.     Gastroenterology: Proton Pump Inhibitors Passed - 05/11/2023  1:26 PM      Passed - Valid encounter within last 12 months    Recent Outpatient Visits           2 months ago Yeast infection   [place] [facility] at [facility], Nyoka Cowden, MD   3 months ago Type II diabetes mellitus with complication [facility])   [place] [facility] at [facility], Nyoka Cowden, MD   4 months ago Irritable bowel syndrome, unspecified type   [facility] at [facility], Nyoka Cowden, MD   6 months ago Chronic UTI   [facility] at [facility], Nyoka Cowden, MD   6 months ago Chronic UTI (urinary tract infection)   [place] [facility] at [facility], Ocie Bob, MD       Future Appointments             In 1 week Judithann Graves Nyoka Cowden, MD [facility] at [facility], [facility]

## 2023-05-25 ENCOUNTER — Encounter: Payer: Self-pay | Admitting: Internal Medicine

## 2023-05-25 ENCOUNTER — Ambulatory Visit (INDEPENDENT_AMBULATORY_CARE_PROVIDER_SITE_OTHER): Payer: 59 | Admitting: Internal Medicine

## 2023-05-25 VITALS — BP 128/78 | HR 76 | Ht 62.0 in | Wt 143.0 lb

## 2023-05-25 DIAGNOSIS — Z7984 Long term (current) use of oral hypoglycemic drugs: Secondary | ICD-10-CM

## 2023-05-25 DIAGNOSIS — E118 Type 2 diabetes mellitus with unspecified complications: Secondary | ICD-10-CM

## 2023-05-25 DIAGNOSIS — E785 Hyperlipidemia, unspecified: Secondary | ICD-10-CM

## 2023-05-25 DIAGNOSIS — E1169 Type 2 diabetes mellitus with other specified complication: Secondary | ICD-10-CM

## 2023-05-25 DIAGNOSIS — R42 Dizziness and giddiness: Secondary | ICD-10-CM

## 2023-05-25 DIAGNOSIS — R300 Dysuria: Secondary | ICD-10-CM | POA: Diagnosis not present

## 2023-05-25 LAB — POCT URINALYSIS DIPSTICK
Bilirubin, UA: NEGATIVE
Blood, UA: NEGATIVE
Glucose, UA: POSITIVE — AB
Ketones, UA: NEGATIVE
Nitrite, UA: POSITIVE
Protein, UA: NEGATIVE
Spec Grav, UA: 1.01 (ref 1.010–1.025)
Urobilinogen, UA: 0.2 U/dL
pH, UA: 6 (ref 5.0–8.0)

## 2023-05-25 MED ORDER — MECLIZINE HCL 12.5 MG PO TABS
12.5000 mg | ORAL_TABLET | Freq: Three times a day (TID) | ORAL | 0 refills | Status: AC | PRN
Start: 2023-05-25 — End: ?

## 2023-05-25 MED ORDER — CIPROFLOXACIN HCL 250 MG PO TABS
250.0000 mg | ORAL_TABLET | Freq: Two times a day (BID) | ORAL | 0 refills | Status: AC
Start: 2023-05-25 — End: 2023-05-30

## 2023-05-25 NOTE — Assessment & Plan Note (Signed)
LDL is  Lab Results  Component Value Date   LDLCALC 84 05/04/2022   Currently being treated with pravastatin with good compliance and no concerns.

## 2023-05-25 NOTE — Assessment & Plan Note (Signed)
Blood sugars stable without hypoglycemic symptoms or events. Current regimen is metformin and farxiga. Changes made last visit are none. Lab Results  Component Value Date   HGBA1C 7.5 (H) 01/21/2023

## 2023-05-25 NOTE — Progress Notes (Signed)
Date:  05/25/2023   Name:  Samantha Welch   DOB:  1950-11-20   MRN:  [id number]   Chief Complaint: Diabetes, Hypertension, Dizziness (X 3 weeks. Feels dizzy and "drunk". Says she staggers when she walks. When standing, she gets dizzy.), and Dysuria (Burning while urinating for over 1 week now.)  Diabetes She presents for her follow-up diabetic visit. She has type 2 diabetes mellitus. Her disease course has been stable. Hypoglycemia symptoms include dizziness. Pertinent negatives for hypoglycemia include no headaches or tremors. Pertinent negatives for diabetes include no chest pain, no fatigue, no polydipsia and no polyuria. Current diabetic treatment includes oral agent (dual therapy) (metformin and farxiga).  Hyperlipidemia This is a chronic problem. The problem is controlled. Pertinent negatives include no chest pain or shortness of breath. Current antihyperlipidemic treatment includes statins. The current treatment provides significant improvement of lipids.  Dysuria  This is a new problem. The current episode started in the past 7 days. The problem occurs every urination. The problem has been unchanged. Pertinent negatives include no hematuria, nausea or vomiting.  Dizziness This is a new problem. The current episode started 1 to 4 weeks ago. The problem occurs daily. Pertinent negatives include no abdominal pain, arthralgias, chest pain, coughing, fatigue, fever, headaches, nausea, numbness or vomiting.    Lab Results  Component Value Date   NA 137 01/21/2023   K 4.9 01/21/2023   CO2 21 01/21/2023   GLUCOSE 144 (H) 01/21/2023   BUN 15 01/21/2023   CREATININE 1.21 (H) 01/21/2023   CALCIUM 9.6 01/21/2023   EGFR 47 (L) 01/21/2023   GFRNONAA >60 05/19/2022   Lab Results  Component Value Date   CHOL 170 05/04/2022   HDL 46 05/04/2022   LDLCALC 84 05/04/2022   TRIG 198 (A) 05/04/2022   No results found for: "TSH" Lab Results  Component Value Date   HGBA1C 7.5 (H) 01/21/2023    Lab Results  Component Value Date   WBC 11.1 (H) 09/21/2022   HGB 15.4 09/21/2022   HCT 46.3 09/21/2022   MCV 93 09/21/2022   PLT 319 09/21/2022   Lab Results  Component Value Date   ALT 19 09/21/2022   AST 12 09/21/2022   ALKPHOS 53 09/21/2022   BILITOT 0.5 09/21/2022   No results found for: "25OHVITD2", "25OHVITD3", "VD25OH"   Review of Systems  Constitutional:  Negative for appetite change, fatigue, fever and unexpected weight change.  HENT:  Negative for tinnitus and trouble swallowing.   Eyes:  Negative for visual disturbance.  Respiratory:  Negative for cough, chest tightness and shortness of breath.   Cardiovascular:  Negative for chest pain, palpitations and leg swelling.  Gastrointestinal:  Negative for abdominal pain, nausea and vomiting.  Endocrine: Negative for polydipsia and polyuria.  Genitourinary:  Positive for dysuria. Negative for hematuria.  Musculoskeletal:  Negative for arthralgias.  Neurological:  Positive for dizziness. Negative for tremors, numbness and headaches.  Psychiatric/Behavioral:  Negative for dysphoric mood.     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   Recurrent UTI 02/10/2016   Major depression single episode, in partial remission (HCC) 03/31/2015   Hyperlipidemia associated with type 2 diabetes mellitus (HCC) 07/25/2014   Type II diabetes mellitus with complication (HCC) 07/25/2014   GERD (gastroesophageal reflux disease) 04/23/2011    Allergies  Allergen Reactions  Venlafaxine Nausea Only    Past Surgical History:  Procedure Laterality Date   COLONOSCOPY WITH PROPOFOL N/A 10/05/2021   Procedure: COLONOSCOPY WITH PROPOFOL;  Surgeon: Jaynie Collins, DO;  Location: [facility] ENDOSCOPY;  Service: Gastroenterology;  Laterality: N/A;   CYSTOSCOPY W/ URETERAL STENT  PLACEMENT Left 12/15/2015   Procedure: CYSTOSCOPY WITH STENT REPLACEMENT;  Surgeon: Vanna Scotland, MD;  Location: [facility] ORS;  Service: Urology;  Laterality: Left;   CYSTOSCOPY WITH STENT PLACEMENT Left 11/16/2015   Procedure: CYSTOSCOPY WITH LEFT STENT PLACEMENT, LEFT RETROGRADE;  Surgeon: Eliot Ford, MD;  Location: [facility] ORS;  Service: Urology;  Laterality: Left;   FOOT SURGERY Bilateral    KNEE ARTHROSCOPY WITH MEDIAL MENISECTOMY Left 03/20/2020   Procedure: knee arthroscopic partial medial AND lateral meniscectomy;  Surgeon: Signa Kell, MD;  Location: [facility];  Service: Orthopedics;  Laterality: Left;  Diabetic - oral meds   TUBAL LIGATION     URETEROSCOPY WITH HOLMIUM LASER LITHOTRIPSY Left 12/15/2015   Procedure: URETEROSCOPY WITH HOLMIUM LASER LITHOTRIPSY;  Surgeon: Vanna Scotland, MD;  Location: [facility] ORS;  Service: Urology;  Laterality: Left;    Social History   Tobacco Use   Smoking status: Every Day    Current packs/day: 0.50    Average packs/day: 0.5 packs/day for 20.0 years (10.0 ttl pk-yrs)    Types: Cigarettes   Smokeless tobacco: Never  Vaping Use   Vaping status: Never Used  Substance Use Topics   Alcohol use: No    Alcohol/week: 0.0 standard drinks of alcohol   Drug use: No     Medication list has been reviewed and updated.  Current Meds  Medication Sig   aspirin 81 MG chewable tablet Chew 81 mg by mouth daily.   celecoxib (CELEBREX) 200 MG capsule Take 200 mg by mouth 2 (two) times daily.   ciprofloxacin (CIPRO) 250 MG tablet Take 1 tablet (250 mg total) by mouth 2 (two) times daily for 5 days.   clonazePAM (KLONOPIN) 1 MG tablet Take 1 mg by mouth 2 (two) times daily.   dicyclomine (BENTYL) 10 MG capsule Take 10 mg by mouth 4 (four) times daily -  before meals and at bedtime.   estradiol (ESTRACE) 0.1 MG/GM vaginal cream Estrogen Cream Instruction Discard applicator Apply pea sized amount to tip of finger to urethra before bed. Wash hands well  after application. Use Monday, Wednesday and Friday   FARXIGA 5 MG TABS tablet Take 1 tablet (5 mg total) by mouth daily before breakfast.   gabapentin (NEURONTIN) 300 MG capsule Take 300 mg by mouth at bedtime as needed.   glucose blood test strip    meclizine (ANTIVERT) 12.5 MG tablet Take 1 tablet (12.5 mg total) by mouth 3 (three) times daily as needed for dizziness.   metFORMIN (GLUCOPHAGE) 1000 MG tablet Take 0.5 tablets (500 mg total) by mouth 2 (two) times daily with a meal.   nitrofurantoin, macrocrystal-monohydrate, (MACROBID) 100 MG capsule Take 1 capsule (100 mg total) by mouth at bedtime.   ondansetron (ZOFRAN) 4 MG tablet Take 1 tablet (4 mg total) by mouth every 8 (eight) hours as needed for nausea or vomiting.   pantoprazole (PROTONIX) 40 MG tablet Take 1 tablet (40 mg total) by mouth at bedtime.   Plecanatide (TRULANCE) 3 MG TABS Take 1 tablet (3 mg total) by mouth daily at 6 (six) AM.   pravastatin (PRAVACHOL) 20 MG tablet Take 1 tablet (20 mg total) by mouth at bedtime.   tiZANidine (ZANAFLEX) 4 MG  tablet Take 1 tablet (4 mg total) by mouth every 6 (six) hours as needed for muscle spasms.   traMADol (ULTRAM) 50 MG tablet Take by mouth every 6 (six) hours as needed.   traZODone (DESYREL) 50 MG tablet Take 50-100 mg by mouth at bedtime.       05/25/2023    2:37 PM 01/21/2023    1:22 PM 12/28/2022    8:57 AM 10/26/2022   11:16 AM  GAD 7 : Generalized Anxiety Score  Nervous, Anxious, on Edge 0 2 2 3   Control/stop worrying 0 2 2 3   Worry too much - different things 0 2 2 3   Trouble relaxing 0 2 2 3   Restless 0 2 2 2   Easily annoyed or irritable 0 2 2 1   Afraid - awful might happen 0 2 2 1   Total GAD 7 Score 0 14 14 16   Anxiety Difficulty Not difficult at all Somewhat difficult Somewhat difficult Not difficult at all       05/25/2023    2:37 PM 01/21/2023    1:21 PM 12/28/2022    8:57 AM  Depression screen PHQ 2/9  Decreased Interest 0 3 2  Down, Depressed, Hopeless 0 3  0  PHQ - 2 Score 0 6 2  Altered sleeping 0 3 1  Tired, decreased energy 0 1 1  Change in appetite 0 0 0  Feeling bad or failure about yourself  0 0 0  Trouble concentrating 0 0 0  Moving slowly or fidgety/restless 0 0 0  Suicidal thoughts 0 0 0  PHQ-9 Score 0 10 4  Difficult doing work/chores Not difficult at all Somewhat difficult Not difficult at all    BP Readings from Last 3 Encounters:  05/25/23 128/78  03/08/23 128/70  01/31/23 (!) 132/96    Physical Exam Vitals and nursing note reviewed.  Constitutional:      General: She is not in acute distress.    Appearance: She is well-developed.  HENT:     Head: Normocephalic and atraumatic.     Right Ear: Tympanic membrane and ear canal normal.     Left Ear: Tympanic membrane and ear canal normal.     Nose:     Right Sinus: No maxillary sinus tenderness or frontal sinus tenderness.     Left Sinus: No maxillary sinus tenderness or frontal sinus tenderness.     Mouth/Throat:     Pharynx: Oropharynx is clear.  Neck:     Vascular: No carotid bruit.  Cardiovascular:     Rate and Rhythm: Normal rate and regular rhythm.     Pulses: Normal pulses.  Pulmonary:     Effort: Pulmonary effort is normal. No respiratory distress.     Breath sounds: No wheezing or rhonchi.  Abdominal:     General: Abdomen is flat.     Palpations: Abdomen is soft.     Tenderness: There is abdominal tenderness in the suprapubic area.  Musculoskeletal:     Cervical back: Normal range of motion.     Right lower leg: No edema.     Left lower leg: No edema.  Lymphadenopathy:     Cervical: No cervical adenopathy.  Skin:    General: Skin is warm and dry.     Findings: No rash.  Neurological:     Mental Status: She is alert and oriented to person, place, and time.     Motor: Motor function is intact.     Coordination: Coordination is intact. Coordination normal.  Finger-Nose-Finger Test normal.     Gait: Gait abnormal (mild ataxia).  Psychiatric:         Mood and Affect: Mood normal.        Behavior: Behavior normal.     Wt Readings from Last 3 Encounters:  05/25/23 143 lb (64.9 kg)  03/08/23 148 lb (67.1 kg)  01/31/23 149 lb (67.6 kg)    BP 128/78 (BP Location: Left Arm, Cuff Size: Large)   Pulse 76   Ht 5\' 2"  (1.575 m)   Wt 143 lb (64.9 kg)   SpO2 98%   BMI 26.16 kg/m   Assessment and Plan:  Problem List Items Addressed This Visit       Unprioritized   Type II diabetes mellitus with complication (HCC) - Primary (Chronic)    Blood sugars stable without hypoglycemic symptoms or events. Current regimen is metformin and farxiga. Changes made last visit are none. Lab Results  Component Value Date   HGBA1C 7.5 (H) 01/21/2023         Relevant Orders   Comprehensive metabolic panel   Hemoglobin A1c   Hyperlipidemia associated with type 2 diabetes mellitus (HCC) (Chronic)    LDL is  Lab Results  Component Value Date   LDLCALC 84 05/04/2022   Currently being treated with pravastatin with good compliance and no concerns.       Relevant Orders   Lipid panel   Other Visit Diagnoses     Long term current use of oral hypoglycemic drug       Dysuria       recurrent UTI by UA will culture and treat with Cipro recommend Urology follow up   Relevant Medications   ciprofloxacin (CIPRO) 250 MG tablet   Other Relevant Orders   POCT Urinalysis Dipstick (Completed)   Urine Culture   Vertigo       suspect viral inner ear take meclizine tid prn refer to ENT if persistent   Relevant Medications   meclizine (ANTIVERT) 12.5 MG tablet       Return in about 4 months (around 09/24/2023) for DM.    Reubin Milan, MD [facility] [place]

## 2023-05-26 LAB — COMPREHENSIVE METABOLIC PANEL
ALT: 17 IU/L (ref 0–32)
AST: 15 IU/L (ref 0–40)
Albumin: 4.6 g/dL (ref 3.8–4.8)
Alkaline Phosphatase: 50 IU/L (ref 44–121)
BUN/Creatinine Ratio: 15 (ref 12–28)
BUN: 13 mg/dL (ref 8–27)
Bilirubin Total: 0.5 mg/dL (ref 0.0–1.2)
CO2: 25 mmol/L (ref 20–29)
Calcium: 10.1 mg/dL (ref 8.7–10.3)
Chloride: 100 mmol/L (ref 96–106)
Creatinine, Ser: 0.88 mg/dL (ref 0.57–1.00)
Globulin, Total: 2.5 g/dL (ref 1.5–4.5)
Glucose: 103 mg/dL — ABNORMAL HIGH (ref 70–99)
Potassium: 4.6 mmol/L (ref 3.5–5.2)
Sodium: 139 mmol/L (ref 134–144)
Total Protein: 7.1 g/dL (ref 6.0–8.5)
eGFR: 69 mL/min/{1.73_m2} (ref >59)

## 2023-05-26 LAB — LIPID PANEL
Chol/HDL Ratio: 3.7 ratio (ref 0.0–4.4)
Cholesterol, Total: 171 mg/dL (ref 100–199)
HDL: 46 mg/dL (ref >39)
LDL Chol Calc (NIH): 102 mg/dL — ABNORMAL HIGH (ref 0–99)
Triglycerides: 132 mg/dL (ref 0–149)
VLDL Cholesterol Cal: 23 mg/dL (ref 5–40)

## 2023-05-26 LAB — HEMOGLOBIN A1C
Est. average glucose Bld gHb Est-mCnc: 174 mg/dL
Hgb A1c MFr Bld: 7.7 % — ABNORMAL HIGH (ref 4.8–5.6)

## 2023-05-30 ENCOUNTER — Telehealth: Payer: Self-pay

## 2023-05-30 NOTE — Telephone Encounter (Signed)
Michiel Cowboy received a note on the patient from Bari Edward, her PCP, in regards to patient been seen recently and having recurrent UTI and dysuria. Patient was advised by PCP to follow up with urologist. I called patient per Shannon's request and left detailed message letting patient know that we received note from her PCP and wanted to follow up to schedule an appointment with [place] if she would like to be seen.

## 2023-06-02 ENCOUNTER — Other Ambulatory Visit: Payer: Self-pay | Admitting: Internal Medicine

## 2023-06-02 DIAGNOSIS — N390 Urinary tract infection, site not specified: Secondary | ICD-10-CM

## 2023-06-02 LAB — URINE CULTURE

## 2023-06-09 ENCOUNTER — Ambulatory Visit: Payer: 59 | Admitting: Urology

## 2023-06-12 ENCOUNTER — Other Ambulatory Visit: Payer: Self-pay | Admitting: Urology

## 2023-06-12 DIAGNOSIS — N390 Urinary tract infection, site not specified: Secondary | ICD-10-CM

## 2023-06-12 NOTE — Progress Notes (Unsigned)
06/13/2023 3:29 PM   Samantha Welch 26-Feb-1951 [id number]  Referring provider: Reubin Milan, MD [address] [place],  [place] [address]  Urological history: 1. Nephrolithiasis - stone composition of 3% calcium oxalate dihydrate, 77% calcium oxalate monohydrate and 10% calcium phosphate stone - left URS (2017) - contrast CT (05/2022) - no stones   2. rUTI's -contributing factors of age, GSM, diabetes and constipation -contrast CT (05/2022) - no nidus for infection -Documented urine cultures over the last year  05/25/2023- Klebsiella aerogenes  01/10/2023 Hafnia alvei  10/19/2022 E.coli  09/21/2022 Enterobacter cloacae  complex -vaginal estrogen cream, cranberry tablets and nitrofurantoin  Chief Complaint  Patient presents with   Recurrent UTI   HPI: Samantha Welch is a 72 y.o. female who presents today at the request of her PCP, Dr. Judithann Graves, for rUTI's.  Previous records reviewed.   She is having 1-7 daytime voids, with 3 or more episodes of nocturia and a severe urge to urinate.  She is having urinary leakage 1-2 times a week.  She does not wear products for urinary leakage.  She does limit fluid intake and she does engage in toilet mapping.  She continues to experience pain in the urethra and suprapubic region.  She states the pain is so intense that can wake her from sleep.  She describes the pain as her urethra feeling like it is on fire.  She had initially tried to apply the vaginal estrogen cream, but she believes she may have her symptoms worse so she discontinued the cream.  She states the symptoms do abate when she is on antibiotic, but 3 days after she stops the antibiotic the symptoms return and this is even in spite of being on prophylactic Macrobid.  Patient denies any modifying or aggravating factors.  Patient denies any recent UTI's, gross hematuria or flank pain.  Patient denies any fevers, chills, nausea or vomiting.    She denied any vaginal  discharge.  She states she is certain that her symptoms are not coming from her vagina or uterus.  UA some yeast present.    PVR demonstrates adequate emptying  PMH: Past Medical History:  Diagnosis Date   Anxiety    Arthritis    Cancer (HCC)    skin ca on face   Chronic kidney disease    Left kidney stone   COPD (chronic obstructive pulmonary disease) (HCC)    Depression    Diabetes mellitus without complication (HCC)    GERD (gastroesophageal reflux disease)    Hyperlipemia    Stomach ulcer    Tobacco abuse disorder     Surgical History: Past Surgical History:  Procedure Laterality Date   COLONOSCOPY WITH PROPOFOL N/A 10/05/2021   Procedure: COLONOSCOPY WITH PROPOFOL;  Surgeon: Jaynie Collins, DO;  Location: [facility] ENDOSCOPY;  Service: Gastroenterology;  Laterality: N/A;   CYSTOSCOPY W/ URETERAL STENT PLACEMENT Left 12/15/2015   Procedure: CYSTOSCOPY WITH STENT REPLACEMENT;  Surgeon: Vanna Scotland, MD;  Location: [facility] ORS;  Service: Urology;  Laterality: Left;   CYSTOSCOPY WITH STENT PLACEMENT Left 11/16/2015   Procedure: CYSTOSCOPY WITH LEFT STENT PLACEMENT, LEFT RETROGRADE;  Surgeon: Eliot Ford, MD;  Location: [facility] OR[facility];  Service: Urology;  Laterality: Left;   FOOT SURGERY Bilateral    KNEE ARTHROSCOPY WITH MEDIAL MENISECTOMY Left 03/20/2020   Procedure: knee arthroscopic partial medial AND lateral meniscectomy;  Surgeon: Signa Kell, MD;  Location: [facility];  Service: Orthopedics;  Laterality: Left;  Diabetic -  oral meds   TUBAL LIGATION     URETEROSCOPY WITH HOLMIUM LASER LITHOTRIPSY Left 12/15/2015   Procedure: URETEROSCOPY WITH HOLMIUM LASER LITHOTRIPSY;  Surgeon: Vanna Scotland, MD;  Location: [facility] ORS;  Service: Urology;  Laterality: Left;    Home Medications:  Allergies as of 06/13/2023       Reactions   Venlafaxine Nausea Only        Medication List        Accurate as of June 13, 2023  3:29 PM. If you have any questions, ask  your nurse or doctor.          aspirin 81 MG chewable tablet Chew 81 mg by mouth daily.   celecoxib 200 MG capsule Commonly known as: CELEBREX Take 200 mg by mouth 2 (two) times daily.   clonazePAM 1 MG tablet Commonly known as: KLONOPIN Take 1 mg by mouth 2 (two) times daily.   dicyclomine 10 MG capsule Commonly known as: BENTYL Take 10 mg by mouth 4 (four) times daily -  before meals and at bedtime.   estradiol 0.1 MG/GM vaginal cream Commonly known as: ESTRACE Estrogen Cream Instruction Discard applicator Apply pea sized amount to tip of finger to urethra before bed. Wash hands well after application. Use Monday, Wednesday and Friday   Farxiga 5 MG Tabs tablet Generic drug: dapagliflozin propanediol Take 1 tablet (5 mg total) by mouth daily before breakfast.   gabapentin 300 MG capsule Commonly known as: NEURONTIN Take 300 mg by mouth at bedtime as needed.   glucose blood test strip   meclizine 12.5 MG tablet Commonly known as: ANTIVERT Take 1 tablet (12.5 mg total) by mouth 3 (three) times daily as needed for dizziness.   metFORMIN 1000 MG tablet Commonly known as: GLUCOPHAGE Take 0.5 tablets (500 mg total) by mouth 2 (two) times daily with a meal.   nitrofurantoin (macrocrystal-monohydrate) 100 MG capsule Commonly known as: MACROBID Take 1 capsule by mouth at bedtime   ondansetron 4 MG tablet Commonly known as: Zofran Take 1 tablet (4 mg total) by mouth every 8 (eight) hours as needed for nausea or vomiting.   pantoprazole 40 MG tablet Commonly known as: PROTONIX Take 1 tablet (40 mg total) by mouth at bedtime.   pravastatin 20 MG tablet Commonly known as: PRAVACHOL Take 1 tablet (20 mg total) by mouth at bedtime.   tiZANidine 4 MG tablet Commonly known as: Zanaflex Take 1 tablet (4 mg total) by mouth every 6 (six) hours as needed for muscle spasms.   traMADol 50 MG tablet Commonly known as: ULTRAM Take by mouth every 6 (six) hours as needed.    traZODone 50 MG tablet Commonly known as: DESYREL Take 50-100 mg by mouth at bedtime.   Trulance 3 MG Tabs Generic drug: Plecanatide Take 1 tablet (3 mg total) by mouth daily at 6 (six) AM.        Allergies:  Allergies  Allergen Reactions   Venlafaxine Nausea Only    Family History: Family History  Problem Relation Age of Onset   Colon cancer Brother    Bladder Cancer Neg Hx    Kidney cancer Neg Hx    Prostate cancer Neg Hx    Breast cancer Neg Hx     Social History:  reports that she has been smoking cigarettes. She has a 10 pack-year smoking history. She has never used smokeless tobacco. She reports that she does not drink alcohol and does not use drugs.  ROS: Pertinent ROS in HPI  Physical Exam: BP 123/62 (BP Location: Left Arm, Patient Position: Sitting, Cuff Size: Normal)   Pulse 76   Ht 5\' 2"  (1.575 m)   Wt 143 lb (64.9 kg)   BMI 26.16 kg/m   Constitutional:  Well nourished. Alert and oriented, No acute distress. HEENT: [place] AT, moist mucus membranes.  Trachea midline Cardiovascular: No clubbing, cyanosis, or edema. Respiratory: Normal respiratory effort, no increased work of breathing. Neurologic: Grossly intact, no focal deficits, moving all 4 extremities. Psychiatric: Normal mood and affect.    Laboratory Data: Lab Results  Component Value Date   WBC 11.1 (H) 09/21/2022   HGB 15.4 09/21/2022   HCT 46.3 09/21/2022   MCV 93 09/21/2022   PLT 319 09/21/2022   Lab Results  Component Value Date   CREATININE 0.88 05/25/2023   Lab Results  Component Value Date   HGBA1C 7.7 (H) 05/25/2023      Component Value Date/Time   CHOL 171 05/25/2023 1541   HDL 46 05/25/2023 1541   CHOLHDL 3.7 05/25/2023 1541   LDLCALC 102 (H) 05/25/2023 1541   Lab Results  Component Value Date   AST 15 05/25/2023   Lab Results  Component Value Date   ALT 17 05/25/2023   Urinalysis Component     Latest Ref Rng 06/13/2023  Color, Urine     YELLOW  YELLOW    Appearance     CLEAR  HAZY !   Specific Gravity, Urine     1.005 - 1.030  1.010   pH     5.0 - 8.0  6.0   Glucose, UA     NEGATIVE mg/dL >=034 !   Hgb urine dipstick     NEGATIVE  NEGATIVE   Bilirubin Urine     NEGATIVE  NEGATIVE   Ketones, ur     NEGATIVE mg/dL NEGATIVE   Protein     NEGATIVE mg/dL NEGATIVE   Nitrite     NEGATIVE  NEGATIVE   Leukocytes,Ua     NEGATIVE  NEGATIVE   Squamous Epithelial / HPF     0 - 5 /HPF 0-5   WBC, UA     0 - 5 WBC/hpf 6-10   RBC / HPF     0 - 5 RBC/hpf 0-5   Bacteria, UA     NONE SEEN  FEW !   Budding Yeast PRESENT     Legend: ! Abnormal I have reviewed the labs.   Pertinent Imaging:  06/13/23 15:08  Scan Result    Assessment & Plan:    1. rUTI's -could not tolerate the vaginal estrogen cream -With her smoking history, explained she is at an increased risk for possible bladder cancer so we will get her scheduled for cystoscopy to evaluate for this as a cause of her symptoms -Explained that the cystoscopy is a safe and common diagnostic test performed by one of our physicians   in the office.  It consist of using a thin, lighted tube to look directly inside the bladder and urethra to evaluate the anatomy.  The procedure is brief, typically taking about 5 minutes. -This will enable [place] to assess bladder health and rule out other bladder conditions (stricture disease, stones, cancer, etc.)  -Advised the patient that there are no restrictions to eating or drinking prior to the cystoscopy -They can continue to take all of their medications as prescribed -They can drive themselves to and from the appointment -I explained that during the procedure, the area around the  urethra will be cleansed thoroughly, topical anesthetic will be applied to numb your urethra, the thin tube is then gently inserted through the urethra into your bladder while fluid flows through the tube to the bladder to enable better visualization -I explained the  procedure is usually not painful, however there may be some discomfort (pinching feeling), and they may feel an urge to urinate, coolness or fullness in the bladder and then the cystoscope is removed -After the cystoscopy, I advised them that they may experience urinary frequency, hematuria, dysuria which will resolve within 24 to 48 hours -Reviewed red flag signs (fever, bright red blood or blood clots in the urine, abdominal pain or difficulty urinating) and to contact the office immediately or seek treatment in the ED if they should experience any of these -The physician will discuss the results of the cystoscopy at the time of the procedure   2. Nephrolithiasis -No recent passage of fragments and no renal colic     3. GSM -could not tolerate vaginal estrogen cream                            Return for cysto for persistent UTI symptoms in spite of antibiotics in smoker .  These notes generated with voice recognition software. I apologize for typographical errors.  Cloretta Ned  [facility] [address]  [address] [place], [place] [address] [phone]

## 2023-06-13 ENCOUNTER — Ambulatory Visit (INDEPENDENT_AMBULATORY_CARE_PROVIDER_SITE_OTHER): Payer: 59 | Admitting: Urology

## 2023-06-13 ENCOUNTER — Other Ambulatory Visit
Admission: RE | Admit: 2023-06-13 | Discharge: 2023-06-13 | Disposition: A | Discharge status: Home / Self Care | Payer: 59 | Attending: Urology | Admitting: Urology

## 2023-06-13 ENCOUNTER — Encounter: Payer: Self-pay | Admitting: Urology

## 2023-06-13 VITALS — BP 123/62 | HR 76 | Ht 62.0 in | Wt 143.0 lb

## 2023-06-13 DIAGNOSIS — Z87440 Personal history of urinary (tract) infections: Secondary | ICD-10-CM | POA: Diagnosis not present

## 2023-06-13 DIAGNOSIS — Z87442 Personal history of urinary calculi: Secondary | ICD-10-CM | POA: Diagnosis not present

## 2023-06-13 DIAGNOSIS — R3989 Other symptoms and signs involving the genitourinary system: Secondary | ICD-10-CM

## 2023-06-13 DIAGNOSIS — R351 Nocturia: Secondary | ICD-10-CM | POA: Diagnosis not present

## 2023-06-13 DIAGNOSIS — R3915 Urgency of urination: Secondary | ICD-10-CM

## 2023-06-13 DIAGNOSIS — N200 Calculus of kidney: Secondary | ICD-10-CM

## 2023-06-13 DIAGNOSIS — N390 Urinary tract infection, site not specified: Secondary | ICD-10-CM

## 2023-06-13 DIAGNOSIS — N952 Postmenopausal atrophic vaginitis: Secondary | ICD-10-CM

## 2023-06-13 LAB — URINALYSIS, COMPLETE (UACMP) WITH MICROSCOPIC
Bilirubin Urine: NEGATIVE
Glucose, UA: >=500 mg/dL — AB
Hgb urine dipstick: NEGATIVE
Ketones, ur: NEGATIVE mg/dL
Leukocytes,Ua: NEGATIVE
Nitrite: NEGATIVE
Protein, ur: NEGATIVE mg/dL
Specific Gravity, Urine: 1.01 (ref 1.005–1.030)
pH: 6 (ref 5.0–8.0)

## 2023-06-13 LAB — BLADDER SCAN AMB NON-IMAGING

## 2023-06-13 NOTE — Patient Instructions (Signed)

## 2023-06-15 LAB — URINE CULTURE: Culture: NO GROWTH

## 2023-06-21 ENCOUNTER — Other Ambulatory Visit: Payer: 59 | Admitting: Urology

## 2023-06-28 ENCOUNTER — Telehealth: Payer: Self-pay | Admitting: Urology

## 2023-06-28 ENCOUNTER — Other Ambulatory Visit: Payer: 59 | Admitting: Urology

## 2023-06-28 NOTE — Telephone Encounter (Signed)
Patient was scheduled for cystoscopy today with [facility], he requested that the procedure be moved back due to cysto water shortage she has been rescheduled. Please see message from patient and advise on any additional recommendations.

## 2023-06-28 NOTE — Telephone Encounter (Signed)
[facility] [place]

## 2023-06-29 ENCOUNTER — Other Ambulatory Visit: Payer: Self-pay | Admitting: Urology

## 2023-06-29 DIAGNOSIS — N390 Urinary tract infection, site not specified: Secondary | ICD-10-CM

## 2023-06-29 NOTE — Telephone Encounter (Signed)
.  left message to have patient return my call.  

## 2023-06-30 ENCOUNTER — Other Ambulatory Visit: Payer: Self-pay | Admitting: Internal Medicine

## 2023-06-30 DIAGNOSIS — K581 Irritable bowel syndrome with constipation: Secondary | ICD-10-CM

## 2023-06-30 NOTE — Telephone Encounter (Signed)
Requested medications are due for refill today.  yes  Requested medications are on the active medications list.  yes  Last refill. 01/10/2023 #90 1 rf  Future visit scheduled.   yes  Notes to clinic.  Refill not delegated.    Requested Prescriptions  Pending Prescriptions Disp Refills   TRULANCE 3 MG TABS [Pharmacy Med Name: Trulance 3 MG Oral Tablet] 90 tablet 0    Sig: TAKE 1 TABLET BY MOUTH DAILY AT 6 AM     Off-Protocol Failed - 06/30/2023  4:23 PM      Failed - Medication not assigned to a protocol, review manually.      Passed - Valid encounter within last 12 months    Recent Outpatient Visits           1 month ago Type II diabetes mellitus with complication [facility])   [place] [facility] at [facility], Nyoka Cowden, MD   3 months ago Yeast infection   [facility] at [facility], Nyoka Cowden, MD   5 months ago Type II diabetes mellitus with complication [facility])   [place] [facility] at [facility], Nyoka Cowden, MD   6 months ago Irritable bowel syndrome, unspecified type   [facility] at [facility], Nyoka Cowden, MD   8 months ago Chronic UTI   [facility] Medicine at [facility], Nyoka Cowden, MD       Future Appointments             In 2 months Judithann Graves, Nyoka Cowden, MD [facility] Primary Care & Sports Medicine at [facility]

## 2023-06-30 NOTE — Telephone Encounter (Signed)
.  left message to have patient return my call.  

## 2023-07-01 ENCOUNTER — Other Ambulatory Visit: Payer: Self-pay | Admitting: Internal Medicine

## 2023-07-01 DIAGNOSIS — Z1231 Encounter for screening mammogram for malignant neoplasm of breast: Secondary | ICD-10-CM

## 2023-07-10 ENCOUNTER — Other Ambulatory Visit: Payer: Self-pay | Admitting: Internal Medicine

## 2023-07-14 ENCOUNTER — Other Ambulatory Visit: Payer: Self-pay | Admitting: Internal Medicine

## 2023-07-14 ENCOUNTER — Ambulatory Visit
Admission: RE | Admit: 2023-07-14 | Discharge: 2023-07-14 | Disposition: A | Discharge status: Home / Self Care | Payer: 59 | Source: Ambulatory Visit | Attending: Internal Medicine | Admitting: Internal Medicine

## 2023-07-14 DIAGNOSIS — Z1231 Encounter for screening mammogram for malignant neoplasm of breast: Secondary | ICD-10-CM | POA: Insufficient documentation

## 2023-07-14 DIAGNOSIS — K589 Irritable bowel syndrome without diarrhea: Secondary | ICD-10-CM

## 2023-07-19 ENCOUNTER — Other Ambulatory Visit: Payer: Self-pay | Admitting: Internal Medicine

## 2023-07-19 DIAGNOSIS — R928 Other abnormal and inconclusive findings on diagnostic imaging of breast: Secondary | ICD-10-CM

## 2023-07-19 DIAGNOSIS — R921 Mammographic calcification found on diagnostic imaging of breast: Secondary | ICD-10-CM

## 2023-07-26 ENCOUNTER — Ambulatory Visit: Payer: 59 | Admitting: Urology

## 2023-07-26 ENCOUNTER — Encounter: Payer: Self-pay | Admitting: Urology

## 2023-07-26 VITALS — BP 143/75 | HR 70 | Ht 62.0 in | Wt 145.0 lb

## 2023-07-26 DIAGNOSIS — N3289 Other specified disorders of bladder: Secondary | ICD-10-CM | POA: Diagnosis not present

## 2023-07-26 DIAGNOSIS — N390 Urinary tract infection, site not specified: Secondary | ICD-10-CM

## 2023-07-26 DIAGNOSIS — Z87440 Personal history of urinary (tract) infections: Secondary | ICD-10-CM | POA: Diagnosis not present

## 2023-07-26 NOTE — Progress Notes (Signed)
Cystoscopy Procedure Note:  Indication: recurrent UTI  After informed consent and discussion of the procedure and its risks, Samantha Welch was positioned and prepped in the standard fashion. Cystoscopy was performed with a flexible cystoscope. The urethra, bladder neck and entire bladder was visualized in a standard fashion. The ureteral orifices were visualized in their normal location and orientation. Bladder mucosa grossly normal. On retroflexion, mild erythema at the bladder neck. Cytology sent.   Findings: Mild erythema at bladder neck on retroflexion, follow up cytology  Assessment and Plan: Follow up cytology- if negative rtc with PA 3 mo Cranberry tablets and nitrofurantoin ppx, consider hiprexc in the future if rUTI  Legrand Rams, MD 07/26/2023

## 2023-07-29 ENCOUNTER — Ambulatory Visit
Admission: RE | Admit: 2023-07-29 | Discharge: 2023-07-29 | Disposition: A | Discharge status: Home / Self Care | Payer: 59 | Source: Ambulatory Visit | Attending: Internal Medicine | Admitting: Internal Medicine

## 2023-07-29 ENCOUNTER — Inpatient Hospital Stay: Admission: RE | Admit: 2023-07-29 | Payer: 59 | Source: Ambulatory Visit

## 2023-07-29 ENCOUNTER — Other Ambulatory Visit: Payer: Self-pay | Admitting: Internal Medicine

## 2023-07-29 DIAGNOSIS — R921 Mammographic calcification found on diagnostic imaging of breast: Secondary | ICD-10-CM

## 2023-07-29 DIAGNOSIS — R928 Other abnormal and inconclusive findings on diagnostic imaging of breast: Secondary | ICD-10-CM | POA: Diagnosis present

## 2023-08-01 ENCOUNTER — Telehealth: Payer: Self-pay

## 2023-08-01 NOTE — Telephone Encounter (Signed)
Pt informed via mychart, appt scheduled.

## 2023-08-01 NOTE — Telephone Encounter (Signed)
-----   Message from Sondra Come sent at 07/29/2023  3:58 PM EST ----- Regarding: Cytology results Good news, no evidence of bladder cancer on urine cytology, please schedule follow-up with PA in 3 months  Legrand Rams, MD 07/29/2023 ----- Message ----- From: Annamaria Helling Sent: 07/29/2023   2:04 PM EST To: Sondra Come, MD

## 2023-08-08 ENCOUNTER — Ambulatory Visit
Admission: RE | Admit: 2023-08-08 | Discharge: 2023-08-08 | Disposition: A | Discharge status: Home / Self Care | Payer: 59 | Source: Ambulatory Visit | Attending: Internal Medicine | Admitting: Internal Medicine

## 2023-08-08 DIAGNOSIS — R921 Mammographic calcification found on diagnostic imaging of breast: Secondary | ICD-10-CM

## 2023-08-08 DIAGNOSIS — N6489 Other specified disorders of breast: Secondary | ICD-10-CM | POA: Insufficient documentation

## 2023-08-08 DIAGNOSIS — R928 Other abnormal and inconclusive findings on diagnostic imaging of breast: Secondary | ICD-10-CM

## 2023-08-08 HISTORY — PX: BREAST BIOPSY: SHX20

## 2023-08-08 MED ORDER — LIDOCAINE HCL 1 % IJ SOLN
10.0000 mL | Freq: Once | INTRAMUSCULAR | Status: AC
  Administered 2023-08-08: 10 mL
  Filled 2023-08-08: qty 10

## 2023-08-08 MED ORDER — LIDOCAINE-EPINEPHRINE 1 %-1:100000 IJ SOLN
10.0000 mL | Freq: Once | INTRAMUSCULAR | Status: AC
Start: 2023-08-08 — End: 2023-08-08
  Administered 2023-08-08: 10 mL
  Filled 2023-08-08: qty 10

## 2023-08-09 LAB — SURGICAL PATHOLOGY

## 2023-09-26 ENCOUNTER — Ambulatory Visit (INDEPENDENT_AMBULATORY_CARE_PROVIDER_SITE_OTHER): Payer: 59 | Admitting: Internal Medicine

## 2023-09-26 ENCOUNTER — Encounter: Payer: Self-pay | Admitting: Internal Medicine

## 2023-09-26 VITALS — BP 124/60 | HR 77 | Ht 62.0 in | Wt 143.0 lb

## 2023-09-26 DIAGNOSIS — E1169 Type 2 diabetes mellitus with other specified complication: Secondary | ICD-10-CM

## 2023-09-26 DIAGNOSIS — Z7984 Long term (current) use of oral hypoglycemic drugs: Secondary | ICD-10-CM | POA: Diagnosis not present

## 2023-09-26 DIAGNOSIS — E118 Type 2 diabetes mellitus with unspecified complications: Secondary | ICD-10-CM | POA: Diagnosis not present

## 2023-09-26 DIAGNOSIS — R300 Dysuria: Secondary | ICD-10-CM | POA: Diagnosis not present

## 2023-09-26 DIAGNOSIS — E785 Hyperlipidemia, unspecified: Secondary | ICD-10-CM

## 2023-09-26 LAB — POCT URINALYSIS DIPSTICK
Bilirubin, UA: NEGATIVE
Blood, UA: NEGATIVE
Glucose, UA: POSITIVE — AB
Ketones, UA: NEGATIVE
Leukocytes, UA: NEGATIVE
Nitrite, UA: POSITIVE — AB
Protein, UA: NEGATIVE
Spec Grav, UA: 1.02 (ref 1.010–1.025)
Urobilinogen, UA: 0.2 U/dL
pH, UA: 6 (ref 5.0–8.0)

## 2023-09-26 LAB — POCT GLYCOSYLATED HEMOGLOBIN (HGB A1C): Hemoglobin A1C: 7.7 % — AB (ref 4.0–5.6)

## 2023-09-26 MED ORDER — CIPROFLOXACIN HCL 250 MG PO TABS: 250.0000 mg | ORAL_TABLET | Freq: Two times a day (BID) | ORAL | 0 refills | Status: AC

## 2023-09-26 MED ORDER — METFORMIN HCL 1000 MG PO TABS: 500.0000 mg | ORAL_TABLET | Freq: Two times a day (BID) | ORAL | 1 refills | Status: DC

## 2023-09-26 MED ORDER — ONDANSETRON HCL 4 MG PO TABS: 4.0000 mg | ORAL_TABLET | Freq: Three times a day (TID) | ORAL | 1 refills | Status: DC | PRN

## 2023-09-26 NOTE — Progress Notes (Signed)
 Date:  09/26/2023   Name:  Samantha Welch   DOB:  July 28, 1951   MRN:  [id number]   Chief Complaint: Diabetes and Dysuria (X 2 days ago. Pain when urinating, and hurting in pee hole.)  Diabetes She presents for her follow-up diabetic visit. She has type 2 diabetes mellitus. Her disease course has been stable. Pertinent negatives for hypoglycemia include no headaches or tremors. Pertinent negatives for diabetes include no chest pain, no fatigue, no polydipsia and no polyuria.  Hyperlipidemia This is a chronic problem. The problem is controlled. Pertinent negatives include no chest pain or shortness of breath. Current antihyperlipidemic treatment includes statins. The current treatment provides significant improvement of lipids.  Dysuria  This is a new problem. The current episode started in the past 7 days. The problem occurs every urination. The pain is mild. There has been no fever. Pertinent negatives include no hematuria.    Review of Systems  Constitutional:  Negative for appetite change, fatigue, fever and unexpected weight change.  HENT:  Negative for tinnitus and trouble swallowing.   Eyes:  Negative for visual disturbance.  Respiratory:  Negative for cough, chest tightness and shortness of breath.   Cardiovascular:  Negative for chest pain, palpitations and leg swelling.  Gastrointestinal:  Negative for abdominal pain.  Endocrine: Negative for polydipsia and polyuria.  Genitourinary:  Positive for dysuria. Negative for hematuria.  Musculoskeletal:  Negative for arthralgias.  Neurological:  Negative for tremors, numbness and headaches.  Psychiatric/Behavioral:  Negative for dysphoric mood.      Lab Results  Component Value Date   NA 139 05/25/2023   K 4.6 05/25/2023   CO2 25 05/25/2023   GLUCOSE 103 (H) 05/25/2023   BUN 13 05/25/2023   CREATININE 0.88 05/25/2023   CALCIUM 10.1 05/25/2023   EGFR 69 05/25/2023   GFRNONAA >60 05/19/2022   Lab Results  Component Value  Date   CHOL 171 05/25/2023   HDL 46 05/25/2023   LDLCALC 102 (H) 05/25/2023   TRIG 132 05/25/2023   CHOLHDL 3.7 05/25/2023   No results found for: TSH Lab Results  Component Value Date   HGBA1C 7.7 (H) 05/25/2023   Lab Results  Component Value Date   WBC 11.1 (H) 09/21/2022   HGB 15.4 09/21/2022   HCT 46.3 09/21/2022   MCV 93 09/21/2022   PLT 319 09/21/2022   Lab Results  Component Value Date   ALT 17 05/25/2023   AST 15 05/25/2023   ALKPHOS 50 05/25/2023   BILITOT 0.5 05/25/2023   No results found for: MARIEN BOLLS, VD25OH   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   Recurrent UTI 02/10/2016   Major depression single episode, in partial remission (HCC) 03/31/2015   Hyperlipidemia associated with type 2 diabetes mellitus (HCC) 07/25/2014   Type II diabetes mellitus with complication (HCC) 07/25/2014   GERD (gastroesophageal reflux disease) 04/23/2011    Allergies  Allergen Reactions   Venlafaxine  Nausea Only    Past Surgical History:  Procedure Laterality Date   BREAST BIOPSY Right 08/08/2023   rt br calcs, ribbon marker, path pending   BREAST BIOPSY Right 08/08/2023   MM RT BREAST BX W LOC DEV 1ST LESION IMAGE BX SPEC STEREO GUIDE 08/08/2023 [facility]   COLONOSCOPY WITH PROPOFOL  N/A 10/05/2021   Procedure: COLONOSCOPY WITH PROPOFOL ;  Surgeon:  Onita Elspeth Sharper, DO;  Location[facility] ENDOSCOPY;  Service: Gastroenterology;  Laterality: N/A;   CYSTOSCOPY W/ URETERAL STENT PLACEMENT Left 12/15/2015   Procedure: CYSTOSCOPY WITH STENT REPLACEMENT;  Surgeon: Rosina Riis, MD;  Location: [facility] ORS;  Service: Urology;  Laterality: Left;   CYSTOSCOPY WITH STENT PLACEMENT Left 11/16/2015   Procedure: CYSTOSCOPY WITH LEFT STENT PLACEMENT, LEFT RETROGRADE;  Surgeon: Zina JINNY Irwin, MD;  Location: [facility] ORS;  Service: Urology;  Laterality: Left;   FOOT SURGERY Bilateral    KNEE ARTHROSCOPY WITH MEDIAL MENISECTOMY Left 03/20/2020   Procedure: knee arthroscopic partial medial AND lateral meniscectomy;  Surgeon: Tobie Priest, MD;  Location: [facility];  Service: Orthopedics;  Laterality: Left;  Diabetic - oral meds   TUBAL LIGATION     URETEROSCOPY WITH HOLMIUM LASER LITHOTRIPSY Left 12/15/2015   Procedure: URETEROSCOPY WITH HOLMIUM LASER LITHOTRIPSY;  Surgeon: Rosina Riis, MD;  Location: [facility] ORS;  Service: Urology;  Laterality: Left;    Social History   Tobacco Use   Smoking status: Every Day    Current packs/day: 0.50    Average packs/day: 0.5 packs/day for 20.0 years (10.0 ttl pk-yrs)    Types: Cigarettes    Passive exposure: Current   Smokeless tobacco: Never  Vaping Use   Vaping status: Never Used  Substance Use Topics   Alcohol use: No    Alcohol/week: 0.0 standard drinks of alcohol   Drug use: No     Medication list has been reviewed and updated.  Current Meds  Medication Sig   aspirin  81 MG chewable tablet Chew 81 mg by mouth daily.   celecoxib (CELEBREX) 200 MG capsule Take 200 mg by mouth 2 (two) times daily.   ciprofloxacin  (CIPRO ) 250 MG tablet Take 1 tablet (250 mg total) by mouth 2 (two) times daily for 5 days.   clonazePAM  (KLONOPIN ) 1 MG tablet Take 1 mg by mouth 2 (two) times daily.   dicyclomine (BENTYL) 10 MG capsule Take 10 mg by mouth 4 (four) times daily -  before meals and at bedtime.   estradiol  (ESTRACE ) 0.1 MG/GM vaginal cream Estrogen Cream Instruction Discard applicator Apply pea sized amount to tip of finger to urethra before bed. Wash hands well after application. Use Monday, Wednesday and Friday   FARXIGA  5 MG TABS tablet Take 1 tablet (5 mg total) by mouth daily before breakfast.   gabapentin (NEURONTIN) 300 MG capsule Take 300 mg by mouth at bedtime as needed.   glucose blood test strip     meclizine  (ANTIVERT ) 12.5 MG tablet Take 1 tablet (12.5 mg total) by mouth 3 (three) times daily as needed for dizziness.   nitrofurantoin , macrocrystal-monohydrate, (MACROBID ) 100 MG capsule Take 1 capsule by mouth at bedtime   pantoprazole  (PROTONIX ) 40 MG tablet TAKE 1 TABLET BY MOUTH AT  BEDTIME   pravastatin  (PRAVACHOL ) 20 MG tablet TAKE 1 TABLET BY MOUTH AT  BEDTIME   sertraline (ZOLOFT) 25 MG tablet Take 25 mg by mouth daily.   tiZANidine  (ZANAFLEX ) 4 MG tablet Take 1 tablet (4 mg total) by mouth every 6 (six) hours as needed for muscle spasms.   traMADol  (ULTRAM ) 50 MG tablet Take by mouth every 6 (six) hours as needed.   traZODone (DESYREL) 50 MG tablet Take 50-100 mg by mouth at bedtime.   TRULANCE  3 MG TABS TAKE 1 TABLET BY MOUTH DAILY AT 6 AM   [DISCONTINUED] metFORMIN  (GLUCOPHAGE ) 1000 MG tablet Take 0.5 tablets (500 mg total) by mouth 2 (two) times daily  with a meal.   [DISCONTINUED] ondansetron  (ZOFRAN ) 4 MG tablet Take 1 tablet (4 mg total) by mouth every 8 (eight) hours as needed for nausea or vomiting.       09/26/2023    2:49 PM 05/25/2023    2:37 PM 01/21/2023    1:22 PM 12/28/2022    8:57 AM  GAD 7 : Generalized Anxiety Score  Nervous, Anxious, on Edge 2 0 2 2  Control/stop worrying 2 0 2 2  Worry too much - different things 2 0 2 2  Trouble relaxing 0 0 2 2  Restless 0 0 2 2  Easily annoyed or irritable 0 0 2 2  Afraid - awful might happen 0 0 2 2  Total GAD 7 Score 6 0 14 14  Anxiety Difficulty Not difficult at all Not difficult at all Somewhat difficult Somewhat difficult       09/26/2023    2:48 PM 05/25/2023    2:37 PM 01/21/2023    1:21 PM  Depression screen PHQ 2/9  Decreased Interest 1 0 3  Down, Depressed, Hopeless 1 0 3  PHQ - 2 Score 2 0 6  Altered sleeping 1 0 3  Tired, decreased energy 1 0 1  Change in appetite 0 0 0  Feeling bad or failure about yourself  0 0 0  Trouble concentrating 0 0 0  Moving slowly or fidgety/restless 0 0 0  Suicidal  thoughts 0 0 0  PHQ-9 Score 4 0 10  Difficult doing work/chores Not difficult at all Not difficult at all Somewhat difficult    BP Readings from Last 3 Encounters:  09/26/23 124/60  07/26/23 (!) 143/75  06/13/23 123/62    Physical Exam Vitals and nursing note reviewed.  Constitutional:      General: She is not in acute distress.    Appearance: Normal appearance. She is well-developed.  HENT:     Head: Normocephalic and atraumatic.  Cardiovascular:     Rate and Rhythm: Normal rate and regular rhythm.     Heart sounds: No murmur heard. Pulmonary:     Effort: Pulmonary effort is normal. No respiratory distress.     Breath sounds: No wheezing or rhonchi.  Musculoskeletal:     Cervical back: Normal range of motion.     Right lower leg: No edema.     Left lower leg: No edema.  Skin:    General: Skin is warm and dry.     Findings: No rash.  Neurological:     Mental Status: She is alert and oriented to person, place, and time.  Psychiatric:        Mood and Affect: Mood normal.        Behavior: Behavior normal.    Lab Results  Component Value Date   COLORU yellow 09/26/2023   CLARITYU cloudy 09/26/2023   GLUCOSEUR Positive (A) 09/26/2023   BILIRUBINUR neg 09/26/2023   KETONESU neg 09/26/2023   SPECGRAV 1.020 09/26/2023   RBCUR negative 09/26/2023   PHUR 6.0 09/26/2023   PROTEINUR Negative 09/26/2023   UROBILINOGEN 0.2 09/26/2023   LEUKOCYTESUR Negative 09/26/2023     Wt Readings from Last 3 Encounters:  09/26/23 143 lb (64.9 kg)  07/26/23 145 lb (65.8 kg)  06/13/23 143 lb (64.9 kg)    BP 124/60   Pulse 77   Ht 5' 2 (1.575 m)   Wt 143 lb (64.9 kg)   SpO2 96%   BMI 26.16 kg/m   Assessment and Plan:  Problem List Items Addressed This Visit       Unprioritized   Hyperlipidemia associated with type 2 diabetes mellitus (HCC) (Chronic)   On Pravastatin  daily without side effects or other concerns. Lab Results  Component Value Date   LDLCALC 102 (H)  05/25/2023         Relevant Medications   metFORMIN  (GLUCOPHAGE ) 1000 MG tablet   Type II diabetes mellitus with complication (HCC) - Primary (Chronic)   Blood sugars stable without hypoglycemic symptoms or events. Currently managed with MTF and Farxiga . Changes made last visit are none. Lab Results  Component Value Date   HGBA1C 7.7 (H) 05/25/2023  A1C today = 7.7.  continue same medications.       Relevant Medications   metFORMIN  (GLUCOPHAGE ) 1000 MG tablet   Other Relevant Orders   POCT glycosylated hemoglobin (Hb A1C)   Other Visit Diagnoses       Long term current use of oral hypoglycemic drug         Dysuria       UA + for nitrates so will culture Cipro  x 5 days; hold Macrobid  while taking   Relevant Medications   ciprofloxacin  (CIPRO ) 250 MG tablet   ondansetron  (ZOFRAN ) 4 MG tablet   Other Relevant Orders   POCT Urinalysis Dipstick (Completed)   Urine Culture       Return in about 4 months (around 01/24/2024) for DM.    Leita HILARIO Adie, MD [facility] Health Primary Care and Sports Medicine [place]

## 2023-09-26 NOTE — Assessment & Plan Note (Addendum)
 Blood sugars stable without hypoglycemic symptoms or events. Currently managed with MTF and [place]. Changes made last visit are none. Lab Results  Component Value Date   HGBA1C 7.7 (H) 05/25/2023  A1C today = 7.7.  continue same medications.

## 2023-09-26 NOTE — Assessment & Plan Note (Signed)
 On Pravastatin daily without side effects or other concerns. Lab Results  Component Value Date   LDLCALC 102 (H) 05/25/2023

## 2023-09-28 ENCOUNTER — Other Ambulatory Visit: Payer: Self-pay | Admitting: Internal Medicine

## 2023-09-28 DIAGNOSIS — K581 Irritable bowel syndrome with constipation: Secondary | ICD-10-CM

## 2023-09-29 NOTE — Telephone Encounter (Signed)
Pease review.  KP

## 2023-09-29 NOTE — Telephone Encounter (Signed)
Requested medication (s) are due for refill today: yes  Requested medication (s) are on the active medication list: yes  Last refill:  07/04/23  Future visit scheduled: yes  Notes to clinic:  Medication not assigned to a protocol, review manually.       Requested Prescriptions  Pending Prescriptions Disp Refills   TRULANCE 3 MG TABS [Pharmacy Med Name: Trulance 3 MG Oral Tablet] 90 tablet 0    Sig: TAKE 1 TABLET BY MOUTH ONCE DAILY AT  6AM     Off-Protocol Failed - 09/29/2023 11:02 AM      Failed - Medication not assigned to a protocol, review manually.      Passed - Valid encounter within last 12 months    Recent Outpatient Visits           3 days ago Type II diabetes mellitus with complication [facility])   [place] [facility] Medicine at [facility], Nyoka Cowden, MD   4 months ago Type II diabetes mellitus with complication [facility])   [place] [facility] Medicine at [facility], Nyoka Cowden, MD   6 months ago Yeast infection   [facility] Care & Sports Medicine at [facility], Nyoka Cowden, MD   8 months ago Type II diabetes mellitus with complication [facility])   [place] [facility] Care & Sports Medicine at [facility], Nyoka Cowden, MD   9 months ago Irritable bowel syndrome, unspecified type   [facility] Primary Care & Sports Medicine at [facility], Nyoka Cowden, MD       Future Appointments             In 1 month McG[place]an, Elana Alm [facility] Health Urology [place]   In 3 months Judithann Graves Nyoka Cowden, MD [facility] Health Primary Care & Sports Medicine at [facility], [facility]

## 2023-09-30 LAB — URINE CULTURE

## 2023-10-03 ENCOUNTER — Ambulatory Visit: Payer: Self-pay

## 2023-10-03 NOTE — Telephone Encounter (Signed)
Second attempt to reach patient- no answer- left message to call office

## 2023-10-03 NOTE — Telephone Encounter (Signed)
3rd attempt, Patient called, left VM to return the call to the office to speak to the NT.  Unable to reach patient after 3 attempts by [facility] NT, routing to the provider for resolution per protocol.  Summary: rx req / kidney infection concern    The patient shares that they have recently been prescribed antibiotics to help with a kidney infection  The patient shares that they are continuing to experience symptoms of discomfort and would like to be prescribed additional medication  Please contact the patient further when possible

## 2023-10-03 NOTE — Telephone Encounter (Signed)
  Chief Complaint: UTI s/s continue Symptoms: burning with urination, pain in lower abdomen, [place] all the time Frequency: last week Pertinent Negatives: Patient denies  Disposition: [] ED /[] Urgent Care (no appt availability in office) / [] Appointment(In office/virtual)/ []  [place] [facility]/ [] Home Care/ [] Refused Recommended Disposition /[] [place] [facility]/ [x]  Follow-up with PCP Additional Notes: Pt states that she needs 7 days of abx for UTI. Pt states that s/s have not resolved.

## 2023-10-03 NOTE — Telephone Encounter (Signed)
Called pt left VM to call back.  KP 

## 2023-10-03 NOTE — Telephone Encounter (Signed)
Reason for Disposition . [1] Taking antibiotic < 72 hours (3 days) for UTI AND [2] painful urination or frequency is SAME (unchanged, not better)  Answer Assessment - Initial Assessment Questions 1. MAIN SYMPTOM: "What is the main symptom you are concerned about?" (e.g., painful urination, urine frequency)     Burning with and w/o urination. Bottom of stomach hurts 2. BETTER-SAME-WORSE: "Are you getting better, staying the same, or getting worse compared to how you felt at your last visit to the doctor (most recent medical visit)?"     better 6. DIAGNOSIS: "When was the UTI diagnosed?" "By whom?" "Was it a kidney infection, bladder infection or both?"     Dr. Judithann Graves 7. ANTIBIOTIC: "What antibiotic(s) are you taking?" "How many times per day?"     5 days  Protocols used: Urinary Tract Infection on Antibiotic Follow-up Call - [facility]

## 2023-10-03 NOTE — Telephone Encounter (Signed)
Patient called, left VM to return the call to the office to speak to the NT.   Summary: rx req / kidney infection concern   The patient shares that they have recently been prescribed antibiotics to help with a kidney infection  The patient shares that they are continuing to experience symptoms of discomfort and would like to be prescribed additional medication  Please contact the patient further when possible

## 2023-10-04 NOTE — Telephone Encounter (Signed)
Patient was notified. She stated she has an appointment with her urologist.

## 2023-10-20 NOTE — Progress Notes (Signed)
 Large Joint Injection  Date/Time: 10/20/2023 11:26 AM  Performed by: Tobie Earnestine Clamp, MD Authorized by: Tobie Earnestine Clamp, MD   Needle Size:  22 G Location:  Knee Topical skin anesthesia: obtained using ethyl chloride spray   Medications:  40 mg triamcinolone acetonide 40 mg/mL; 2 mL lidocaine  1 %; 2 mL BUPivacaine  HCl 0.5 %

## 2023-10-25 ENCOUNTER — Telehealth: Payer: Self-pay | Admitting: Internal Medicine

## 2023-10-25 ENCOUNTER — Other Ambulatory Visit: Payer: Self-pay

## 2023-10-25 DIAGNOSIS — E118 Type 2 diabetes mellitus with unspecified complications: Secondary | ICD-10-CM

## 2023-10-25 MED ORDER — FARXIGA 5 MG PO TABS: 5.0000 mg | ORAL_TABLET | Freq: Every day | ORAL | 0 refills | Status: DC

## 2023-10-25 MED ORDER — PRAVASTATIN SODIUM 20 MG PO TABS: 20.0000 mg | ORAL_TABLET | Freq: Every day | ORAL | 0 refills | Status: DC

## 2023-10-25 MED ORDER — METFORMIN HCL 1000 MG PO TABS: 500.0000 mg | ORAL_TABLET | Freq: Two times a day (BID) | ORAL | 0 refills | Status: DC

## 2023-10-25 MED ORDER — ONDANSETRON HCL 4 MG PO TABS: 4.0000 mg | ORAL_TABLET | Freq: Three times a day (TID) | ORAL | 0 refills | Status: DC | PRN

## 2023-10-25 NOTE — Telephone Encounter (Signed)
Pt states that her insurance company is needing a Prior Authorization before they can send out her 90 day supply for her medications:   ondansetron (ZOFRAN) 4 MG tablet  metFORMIN (GLUCOPHAGE) 1000 MG tablet pravastatin (PRAVACHOL) 20 MG tablet  FARXIGA 5 MG TABS tablet    [facility] - [place], [place] - [address] [address] Phone: [phone]  Fax: [phone]

## 2023-10-25 NOTE — Telephone Encounter (Signed)
I believe patient meant she needs refills and not PA. Sent in refills to [facility].

## 2023-10-30 NOTE — Progress Notes (Unsigned)
10/31/2023 9:40 PM   Samantha Welch July 23, 1951 [id number]  Referring provider: Reubin Milan, MD [address] [place],  [place] [address]  Urological history: 1. Nephrolithiasis - stone composition of 3% calcium oxalate dihydrate, 77% calcium oxalate monohydrate and 10% calcium phosphate stone - left URS (2017) - contrast CT (05/2022) - no stones   2. rUTI's -contributing factors of age, GSM, diabetes and constipation -contrast CT (05/2022) - no nidus for infection -cysto (07/2023) mild erythema at bladder neck  -urine cytology (07/1013) - negative  -Documented urine cultures over the last year  09/26/2023 - Hafnia alvei   05/25/2023- Klebsiella aerogenes  01/10/2023 Hafnia alvei -vaginal estrogen cream, cranberry tablets and nitrofurantoin  No chief complaint on file.  HPI: Samantha Welch is a 73 y.o. female who presents today for three month follow up.   Previous records reviewed.    PMH: Past Medical History:  Diagnosis Date   Anxiety    Arthritis    Cancer (HCC)    skin ca on face   Chronic kidney disease    Left kidney stone   COPD (chronic obstructive pulmonary disease) (HCC)    Depression    Diabetes mellitus without complication (HCC)    GERD (gastroesophageal reflux disease)    Hyperlipemia    Stomach ulcer    Tobacco abuse disorder     Surgical History: Past Surgical History:  Procedure Laterality Date   BREAST BIOPSY Right 08/08/2023   rt br calcs, ribbon marker, path pending   BREAST BIOPSY Right 08/08/2023   MM RT BREAST BX W LOC DEV 1ST LESION IMAGE BX SPEC STEREO GUIDE 08/08/2023 [facility]   COLONOSCOPY WITH PROPOFOL N/A 10/05/2021   Procedure: COLONOSCOPY WITH PROPOFOL;  Surgeon: Jaynie Collins, DO;  Location: [facility] END[facility]COPY;  Service: Gastroenterology;  Laterality: N/A;   CYSTOSCOPY W/ URETERAL STENT PLACEMENT Left 12/15/2015   Procedure: CYSTOSCOPY WITH STENT REPLACEMENT;  Surgeon: Vanna Scotland, MD;   Location: [facility];  Service: Urology;  Laterality: Left;   CYSTOSCOPY WITH STENT PLACEMENT Left 11/16/2015   Procedure: CYSTOSCOPY WITH LEFT STENT PLACEMENT, LEFT RETROGRADE;  Surgeon: Eliot Ford, MD;  Location: [facility];  Service: Urology;  Laterality: Left;   FOOT SURGERY Bilateral    KNEE ARTHROSCOPY WITH MEDIAL MENISECTOMY Left 03/20/2020   Procedure: knee arthroscopic partial medial AND lateral meniscectomy;  Surgeon: Signa Kell, MD;  Location: [facility];  Service: Orthopedics;  Laterality: Left;  Diabetic - oral meds   TUBAL LIGATION     URETEROSCOPY WITH HOLMIUM LASER LITHOTRIPSY Left 12/15/2015   Procedure: URETEROSCOPY WITH HOLMIUM LASER LITHOTRIPSY;  Surgeon: Vanna Scotland, MD;  Location: [facility];  Service: Urology;  Laterality: Left;    Home Medications:  Allergies as of 10/31/2023       Reactions   Venlafaxine Nausea Only        Medication List        Accurate as of October 30, 2023  9:40 PM. If you have any questions, ask your nurse or doctor.          aspirin 81 MG chewable tablet Chew 81 mg by mouth daily.   celecoxib 200 MG capsule Commonly known as: CELEBREX Take 200 mg by mouth 2 (two) times daily.   clonazePAM 1 MG tablet Commonly known as: KLONOPIN Take 1 mg by mouth 2 (two) times daily.   dicyclomine 10 MG capsule Commonly known as: BENTYL Take 10 mg by mouth 4 (four) times daily -  before meals and at bedtime.   estradiol 0.1 MG/GM vaginal cream Commonly known as: ESTRACE Estrogen Cream Instruction Discard applicator Apply pea sized amount to tip of finger to urethra before bed. Wash hands well after application. Use Monday, Wednesday and Friday   Farxiga 5 MG Tabs tablet Generic drug: dapagliflozin propanediol Take 1 tablet (5 mg total) by mouth daily before breakfast.   gabapentin 300 MG capsule Commonly known as: NEURONTIN Take 300 mg by mouth at bedtime as needed.   glucose blood test strip   meclizine  12.5 MG tablet Commonly known as: ANTIVERT Take 1 tablet (12.5 mg total) by mouth 3 (three) times daily as needed for dizziness.   metFORMIN 1000 MG tablet Commonly known as: GLUCOPHAGE Take 0.5 tablets (500 mg total) by mouth 2 (two) times daily with a meal.   nitrofurantoin (macrocrystal-monohydrate) 100 MG capsule Commonly known as: MACROBID Take 1 capsule by mouth at bedtime   ondansetron 4 MG tablet Commonly known as: Zofran Take 1 tablet (4 mg total) by mouth every 8 (eight) hours as needed for nausea or vomiting.   pantoprazole 40 MG tablet Commonly known as: PROTONIX TAKE 1 TABLET BY MOUTH AT  BEDTIME   pravastatin 20 MG tablet Commonly known as: PRAVACHOL Take 1 tablet (20 mg total) by mouth at bedtime.   sertraline 25 MG tablet Commonly known as: ZOLOFT Take 25 mg by mouth daily.   tiZANidine 4 MG tablet Commonly known as: Zanaflex Take 1 tablet (4 mg total) by mouth every 6 (six) hours as needed for muscle spasms.   traMADol 50 MG tablet Commonly known as: ULTRAM Take by mouth every 6 (six) hours as needed.   traZODone 50 MG tablet Commonly known as: DESYREL Take 50-100 mg by mouth at bedtime.   Trulance 3 MG Tabs Generic drug: Plecanatide TAKE 1 TABLET BY MOUTH ONCE DAILY AT  6AM        Allergies:  Allergies  Allergen Reactions   Venlafaxine Nausea Only    Family History: Family History  Problem Relation Age of Onset   Colon cancer Brother    Bladder Cancer Neg Hx    Kidney cancer Neg Hx    Prostate cancer Neg Hx    Breast cancer Neg Hx     Social History:  reports that she has been smoking cigarettes. She has a 10 pack-year smoking history. She has been exposed to tobacco smoke. She has never used smokeless tobacco. She reports that she does not drink alcohol and does not use drugs.  ROS: Pertinent ROS in HPI  Physical Exam: There were no vitals taken for this visit.  Constitutional:  Well nourished. Alert and oriented, No acute  distress. HEENT: [place] AT, moist mucus membranes.  Trachea midline Cardiovascular: No clubbing, cyanosis, or edema. Respiratory: Normal respiratory effort, no increased work of breathing. Neurologic: Grossly intact, no focal deficits, moving all 4 extremities. Psychiatric: Normal mood and affect.    Laboratory Data: Lab Results  Component Value Date   HGBA1C 7.7 (A) 09/26/2023  I have reviewed the labs.   Pertinent Imaging: N/A  Assessment & Plan:    1. rUTI's ***  2. Nephrolithiasis -No recent passage of fragments and no renal colic     3. GSM -could not tolerate vaginal estrogen cream                            No follow-ups on file.  These notes generated  with voice recognition software. I apologize for typographical errors.  Cloretta Ned  [facility] [address]  [address] [place], [place] [address] [phone]

## 2023-10-31 ENCOUNTER — Ambulatory Visit (INDEPENDENT_AMBULATORY_CARE_PROVIDER_SITE_OTHER): Payer: 59 | Admitting: Urology

## 2023-10-31 ENCOUNTER — Other Ambulatory Visit
Admission: RE | Admit: 2023-10-31 | Discharge: 2023-10-31 | Disposition: A | Discharge status: Home / Self Care | Payer: 59 | Attending: Urology | Admitting: Urology

## 2023-10-31 ENCOUNTER — Ambulatory Visit: Payer: 59 | Admitting: Urology

## 2023-10-31 ENCOUNTER — Encounter: Payer: Self-pay | Admitting: Urology

## 2023-10-31 VITALS — BP 146/73 | HR 73

## 2023-10-31 DIAGNOSIS — R82998 Other abnormal findings in urine: Secondary | ICD-10-CM

## 2023-10-31 DIAGNOSIS — Z87442 Personal history of urinary calculi: Secondary | ICD-10-CM

## 2023-10-31 DIAGNOSIS — N952 Postmenopausal atrophic vaginitis: Secondary | ICD-10-CM | POA: Insufficient documentation

## 2023-10-31 DIAGNOSIS — Z87440 Personal history of urinary (tract) infections: Secondary | ICD-10-CM | POA: Diagnosis not present

## 2023-10-31 DIAGNOSIS — N390 Urinary tract infection, site not specified: Secondary | ICD-10-CM

## 2023-10-31 DIAGNOSIS — N200 Calculus of kidney: Secondary | ICD-10-CM

## 2023-10-31 LAB — URINALYSIS, COMPLETE (UACMP) WITH MICROSCOPIC
Bilirubin Urine: NEGATIVE
Glucose, UA: >=500 mg/dL — AB
Ketones, ur: NEGATIVE mg/dL
Nitrite: POSITIVE — AB
Protein, ur: NEGATIVE mg/dL
Specific Gravity, Urine: 1.01 (ref 1.005–1.030)
WBC, UA: >50 WBC/hpf (ref 0–5)
pH: 5.5 (ref 5.0–8.0)

## 2023-10-31 MED ORDER — METHENAMINE HIPPURATE 1 G PO TABS: 1.0000 g | ORAL_TABLET | Freq: Two times a day (BID) | ORAL | 3 refills | Status: DC

## 2023-10-31 MED ORDER — PREMARIN 0.625 MG/GM VA CREA: 1.0000 | TOPICAL_CREAM | Freq: Every day | VAGINAL | 12 refills | Status: AC

## 2023-11-02 ENCOUNTER — Other Ambulatory Visit: Payer: Self-pay | Admitting: Urology

## 2023-11-02 LAB — URINE CULTURE: Culture: >=100000 — AB

## 2023-11-02 MED ORDER — SULFAMETHOXAZOLE-TRIMETHOPRIM 800-160 MG PO TABS: 1.0000 | ORAL_TABLET | Freq: Two times a day (BID) | ORAL | 0 refills | Status: DC

## 2023-11-24 ENCOUNTER — Telehealth: Payer: Self-pay | Admitting: Internal Medicine

## 2023-11-24 NOTE — Telephone Encounter (Signed)
 Copied from CRM [id number]. Topic: Medicare AWV >> Nov 24, 2023  9:20 AM Payton Doughty wrote: Reason for CRM: Called LVM 11/24/2023 to schedule AWV. Please schedule office or virtual visits  Verlee Rossetti; Care Guide Ambulatory Clinical Support [place] [facility] Direct Dial: [phone]

## 2023-12-02 ENCOUNTER — Ambulatory Visit
Admission: EM | Admit: 2023-12-02 | Discharge: 2023-12-02 | Disposition: A | Discharge status: Home / Self Care | Attending: Emergency Medicine | Admitting: Emergency Medicine

## 2023-12-02 DIAGNOSIS — B3731 Acute candidiasis of vulva and vagina: Secondary | ICD-10-CM | POA: Insufficient documentation

## 2023-12-02 DIAGNOSIS — R300 Dysuria: Secondary | ICD-10-CM | POA: Insufficient documentation

## 2023-12-02 LAB — URINALYSIS, W/ REFLEX TO CULTURE (INFECTION SUSPECTED)
Bilirubin Urine: NEGATIVE
Glucose, UA: 500 mg/dL — AB
Ketones, ur: NEGATIVE mg/dL
Leukocytes,Ua: NEGATIVE
Nitrite: NEGATIVE
Protein, ur: NEGATIVE mg/dL
Specific Gravity, Urine: 1.01 (ref 1.005–1.030)
pH: 5.5 (ref 5.0–8.0)

## 2023-12-02 MED ORDER — FLUCONAZOLE 150 MG PO TABS: 150.0000 mg | ORAL_TABLET | ORAL | 0 refills | Status: AC

## 2023-12-02 MED ORDER — PHENAZOPYRIDINE HCL 200 MG PO TABS: 200.0000 mg | ORAL_TABLET | Freq: Three times a day (TID) | ORAL | 0 refills | Status: DC

## 2023-12-02 NOTE — ED Provider Notes (Signed)
 [facility]    CSN: [id number] Arrival date & time: 12/02/23  1259      History   Chief Complaint Chief Complaint  Patient presents with   Vaginal Itching   Dysuria    HPI Samantha Welch is a 73 y.o. female.   HPI  73 year old female with past medical history significant for diabetes, GERD, COPD, anxiety, tobacco use, chronic kidney disease, hyperlipidemia, depression, and chronic and recurrent urinary tract infections presents for evaluation of genitourinary symptoms that started 2 to 3 days ago that include significant burning in her urethra, somewhat to the point that it causes her discomfort to sit down, and vaginal itching.  She denies any fever, vaginal discharge, or blood in her urine.  She is currently taking methenamine twice weekly for chronic recurrent UTIs and using topical vaginal estrogen 3 times a week without improvement of symptoms.  She is followed by urology.  Past Medical History:  Diagnosis Date   Anxiety    Arthritis    Cancer (HCC)    skin ca on face   Chronic kidney disease    Left kidney stone   COPD (chronic obstructive pulmonary disease) (HCC)    Depression    Diabetes mellitus without complication (HCC)    GERD (gastroesophageal reflux disease)    Hyperlipemia    Stomach ulcer    Tobacco abuse disorder     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   Recurrent UTI 02/10/2016   Major depression single episode, in partial remission (HCC) 03/31/2015   Hyperlipidemia associated with type 2 diabetes mellitus (HCC) 07/25/2014   Type II diabetes mellitus with complication (HCC) 07/25/2014   GERD (gastroesophageal reflux disease) 04/23/2011    Past Surgical History:  Procedure Laterality Date   BREAST BIOPSY Right 08/08/2023   rt br calcs, ribbon marker, path pending    BREAST BIOPSY Right 08/08/2023   MM RT BREAST BX W LOC DEV 1ST LESION IMAGE BX SPEC STEREO GUIDE 08/08/2023 [facility]   COLONOSCOPY WITH PROPOFOL N/A 10/05/2021   Procedure: COLONOSCOPY WITH PROPOFOL;  Surgeon: Jaynie Collins, DO;  Location: [facility] END[facility];  Service: Gastroenterology;  Laterality: N/A;   CYSTOSCOPY W/ URETERAL STENT PLACEMENT Left 12/15/2015   Procedure: CYSTOSCOPY WITH STENT REPLACEMENT;  Surgeon: Vanna Scotland, MD;  Location: [facility];  Service: Urology;  Laterality: Left;   CYSTOSCOPY WITH STENT PLACEMENT Left 11/16/2015   Procedure: CYSTOSCOPY WITH LEFT STENT PLACEMENT, LEFT RETROGRADE;  Surgeon: Eliot Ford, MD;  Location: [facility];  Service: Urology;  Laterality: Left;   FOOT SURGERY Bilateral    KNEE ARTHROSCOPY WITH MEDIAL MENISECTOMY Left 03/20/2020   Procedure: knee arthroscopic partial medial AND lateral meniscectomy;  Surgeon: Signa Kell, MD;  Location[facility];  Service: Orthopedics;  Laterality: Left;  Diabetic - oral meds   TUBAL LIGATION     URETEROSCOPY WITH HOLMIUM LASER LITHOTRIPSY Left 12/15/2015   Procedure: URETEROSCOPY WITH HOLMIUM LASER LITHOTRIPSY;  Surgeon: Vanna Scotland, MD;  Location: [facility];  Service: Urology;  Laterality: Left;    OB History     Gravida  2   Para  2   Term  2   Preterm  0   AB  0   Living  0      SAB  0   IAB  0  Ectopic  0   Multiple  0   Live Births               Home Medications    Prior to Admission medications   Medication Sig Start Date End Date Taking? Authorizing Provider  aspirin 81 MG chewable tablet Chew 81 mg by mouth daily.   Yes [provider]  celecoxib (CELEBREX) 200 MG capsule Take 200 mg by mouth 2 (two) times daily. 05/09/23  Yes [provider]  clonazePAM (KLONOPIN) 1 MG tablet Take 1 mg by mouth 2 (two) times daily.   Yes [provider]  conjugated estrogens (PREMARIN) vaginal cream Place 1 Applicatorful  vaginally daily. Apply 0.5mg  (pea-sized amount)  just inside the vaginal introitus with a finger-tip on  Monday, Wednesday and Friday nights. 10/31/23  Yes McGowan, Carollee Herter A, PA-C  dicyclomine (BENTYL) 10 MG capsule Take 10 mg by mouth 4 (four) times daily -  before meals and at bedtime.   Yes [provider]  FARXIGA 5 MG TABS tablet Take 1 tablet (5 mg total) by mouth daily before breakfast. 10/25/23  Yes Reubin Milan, MD  fluconazole (DIFLUCAN) 150 MG tablet Take 1 tablet (150 mg total) by mouth every 3 (three) days for 3 doses. 12/02/23 12/09/23 Yes Becky Augusta, NP  FLUoxetine (PROZAC) 20 MG capsule Take 20 mg by mouth daily. 10/18/23  Yes [provider]  gabapentin (NEURONTIN) 300 MG capsule Take 300 mg by mouth at bedtime as needed. 05/06/23  Yes [provider]  glucose blood test strip    Yes [provider]  meclizine (ANTIVERT) 12.5 MG tablet Take 1 tablet (12.5 mg total) by mouth 3 (three) times daily as needed for dizziness. 05/25/23  Yes Reubin Milan, MD  metFORMIN (GLUCOPHAGE) 1000 MG tablet Take 0.5 tablets (500 mg total) by mouth 2 (two) times daily with a meal. 10/25/23  Yes Reubin Milan, MD  methenamine (HIPREX) 1 g tablet Take 1 tablet (1 g total) by mouth 2 (two) times daily with a meal. 10/31/23  Yes McGowan, Carollee Herter A, PA-C  ondansetron (ZOFRAN) 4 MG tablet Take 1 tablet (4 mg total) by mouth every 8 (eight) hours as needed for nausea or vomiting. 10/25/23  Yes Reubin Milan, MD  pantoprazole (PROTONIX) 40 MG tablet TAKE 1 TABLET BY MOUTH AT  BEDTIME 07/14/23  Yes Reubin Milan, MD  phenazopyridine (PYRIDIUM) 200 MG tablet Take 1 tablet (200 mg total) by mouth 3 (three) times daily. 12/02/23  Yes Becky Augusta, NP  pravastatin (PRAVACHOL) 20 MG tablet Take 1 tablet (20 mg total) by mouth at bedtime. 10/25/23  Yes Reubin Milan, MD  sulfamethoxazole-trimethoprim (BACTRIM DS) 800-160 MG tablet Take 1 tablet by mouth every 12  (twelve) hours. 11/02/23  Yes McGowan, Carollee Herter A, PA-C  tiZANidine (ZANAFLEX) 4 MG tablet Take 1 tablet (4 mg total) by mouth every 6 (six) hours as needed for muscle spasms. 05/19/22  Yes Brimage, Vondra, DO  traMADol (ULTRAM) 50 MG tablet Take by mouth every 6 (six) hours as needed.   Yes [provider]  traZODone (DESYREL) 50 MG tablet Take 50-100 mg by mouth at bedtime.   Yes [provider]  TRULANCE 3 MG TABS TAKE 1 TABLET BY MOUTH ONCE DAILY AT  6AM 09/29/23  Yes Reubin Milan, MD    Family History Family History  Problem Relation Age of Onset   Colon cancer Brother    Bladder Cancer Neg Hx  Kidney cancer Neg Hx    Prostate cancer Neg Hx    Breast cancer Neg Hx     Social History Social History   Tobacco Use   Smoking status: Every Day    Current packs/day: 0.50    Average packs/day: 0.5 packs/day for 20.0 years (10.0 ttl pk-yrs)    Types: Cigarettes    Passive exposure: Current   Smokeless tobacco: Never  Vaping Use   Vaping status: Never Used  Substance Use Topics   Alcohol use: No    Alcohol/week: 0.0 standard drinks of alcohol   Drug use: No     Allergies   Venlafaxine   Review of Systems Review of Systems  Constitutional:  Negative for fever.  Genitourinary:  Positive for dysuria, frequency, urgency and vaginal pain. Negative for hematuria and vaginal discharge.     Physical Exam Triage Vital Signs ED Triage Vitals  Encounter Vitals Group     BP      Systolic BP Percentile      Diastolic BP Percentile      Pulse      Resp      Temp      Temp src      SpO2      Weight      Height      Head Circumference      Peak Flow      Pain Score      Pain Loc      Pain Education      Exclude from Growth Chart    No data found.  Updated Vital Signs BP (!) 144/73 (BP Location: Right Arm)   Pulse 69   Temp 98.3 F (36.8 C) (Oral)   Resp 16   SpO2 93%   Visual Acuity Right Eye Distance:   Left Eye Distance:   Bilateral  Distance:    Right Eye Near:   Left Eye Near:    Bilateral Near:     Physical Exam Vitals and nursing note reviewed.  Constitutional:      Appearance: Normal appearance.  Cardiovascular:     Rate and Rhythm: Normal rate and regular rhythm.     Pulses: Normal pulses.     Heart sounds: Normal heart sounds. No murmur heard.    No friction rub. No gallop.  Pulmonary:     Effort: Pulmonary effort is normal.     Breath sounds: Normal breath sounds. No wheezing, rhonchi or rales.  Abdominal:     Tenderness: There is no right CVA tenderness or left CVA tenderness.  Skin:    General: Skin is warm and dry.     Capillary Refill: Capillary refill takes less than 2 seconds.     Findings: No rash.  Neurological:     General: No focal deficit present.     Mental Status: She is alert and oriented to person, place, and time.      UC Treatments / Results  Labs (all labs ordered are listed, but only abnormal results are displayed) Labs Reviewed  URINALYSIS, W/ REFLEX TO CULTURE (INFECTION SUSPECTED) - Abnormal; Notable for the following components:      Result Value   Glucose, UA 500 (*)    Hgb urine dipstick TRACE (*)    Bacteria, UA FEW (*)    All other components within normal limits  CERVICOVAGINAL ANCILLARY ONLY    EKG   Radiology No results found.  Procedures Procedures (including critical care time)  Medications Ordered in UC  Medications - No data to display  Initial Impression / Assessment and Plan / UC Course  I have reviewed the triage vital signs and the nursing notes.  Pertinent labs & imaging results that were available during my care of the patient were reviewed by me and considered in my medical decision making (see chart for details).   Patient is a nontoxic-appearing 73 year old female presenting for evaluation of genitourinary symptoms as outlined HPI above.  The patient's mother significant complaint is burning in her urethra as well as significant vaginal  itching.  She denies any discharge or blood in her urine.  She is currently on methenamine for recurrent chronic UTIs and also topical vaginal estrogen but reports that her symptoms have not improved.  She reports that no one has ever swabbed her vaginal vault to look for bacterial vaginosis or vaginal yeast infection.  I will order a cytology swab to evaluate for the presence of BV or yeast as well as a urinalysis to evaluate for the presence of UTI.  Urinalysis shows 500 glucose the patient is on [place].  Also trace hemoglobin.  Negative for leukocyte esterase, nitrates, or protein.  Reflex microscopy shows 0-5 WBCs, 6-10 RBCs, few bacteria, WBC clumps, and budding yeast.  I will discharge patient home with a diagnosis of vaginal yeast infection and start her on Diflucan 150 mg tablets, 1 tablet now and repeat dosing every 3 days for total of 3 doses.  Given the WBC clumps present in her urine I also suspect that she may have BV as a contributing factor though I will have to await the results of the vaginal cytology swab to be sure.  I will also start her on Pyridium to help with the burning with urination.     Final Clinical Impressions(s) / UC Diagnoses   Final diagnoses:  Dysuria  Vaginal yeast infection     Discharge Instructions      Your urinalysis did not show any signs of infection though your urine does have budding yeast in it.  You may be experiencing a vaginal yeast infection which is causing your urinary symptoms.  Take 1 Diflucan tablet now and repeat dosing every 3 days for total of 3 doses to treat your vaginal yeast infection.  I am also going to prescribe you Pyridium to help with the urinary discomfort.  You may take 1 tablet every 8 hours.  This will turn your urine a very vivid red-orange.  The vaginal swab will also look for possible bacterial vaginosis which could be contributing to your symptoms as well.  Those results will be back tomorrow.  If they are positive  you will be contacted by phone and treatment options will be provided.  If your symptoms do not improve I recommend that you follow-up with your urologist or make an appointment with your gynecologist for further evaluation.     ED Prescriptions     Medication Sig Dispense Auth. Provider   fluconazole (DIFLUCAN) 150 MG tablet Take 1 tablet (150 mg total) by mouth every 3 (three) days for 3 doses. 3 tablet Becky Augusta, NP   phenazopyridine (PYRIDIUM) 200 MG tablet Take 1 tablet (200 mg total) by mouth 3 (three) times daily. 15 tablet Becky Augusta, NP      PDMP not reviewed this encounter.   Becky Augusta, NP 12/02/23 [phone]

## 2023-12-02 NOTE — Discharge Instructions (Addendum)
 Your urinalysis did not show any signs of infection though your urine does have budding yeast in it.  You may be experiencing a vaginal yeast infection which is causing your urinary symptoms.  Take 1 Diflucan tablet now and repeat dosing every 3 days for total of 3 doses to treat your vaginal yeast infection.  I am also going to prescribe you Pyridium to help with the urinary discomfort.  You may take 1 tablet every 8 hours.  This will turn your urine a very vivid red-orange.  The vaginal swab will also look for possible bacterial vaginosis which could be contributing to your symptoms as well.  Those results will be back tomorrow.  If they are positive you will be contacted by phone and treatment options will be provided.  If your symptoms do not improve I recommend that you follow-up with your urologist or make an appointment with your gynecologist for further evaluation.

## 2023-12-02 NOTE — ED Triage Notes (Signed)
 Sx x 2-3 days  Vaginal discomfort  Vaginal itching x 10  Dr put patient on methenam and a cream. Patient states that  it hard for her to sit down patient states that it hurts in her urethra.

## 2023-12-05 LAB — CERVICOVAGINAL ANCILLARY ONLY
Bacterial Vaginitis (gardnerella): NEGATIVE
Candida Glabrata: POSITIVE — AB
Candida Vaginitis: POSITIVE — AB
Comment: NEGATIVE
Comment: NEGATIVE
Comment: NEGATIVE

## 2023-12-14 ENCOUNTER — Ambulatory Visit

## 2024-01-02 ENCOUNTER — Other Ambulatory Visit: Payer: Self-pay | Admitting: Internal Medicine

## 2024-01-02 NOTE — Telephone Encounter (Signed)
 Requested Prescriptions  Pending Prescriptions Disp Refills   pravastatin  (PRAVACHOL ) 20 MG tablet [Pharmacy Med Name: Pravastatin  Sodium 20 MG Oral Tablet] 100 tablet 1    Sig: TAKE 1 TABLET BY MOUTH AT  BEDTIME     Cardiovascular:  Antilipid - Statins Failed - 01/02/2024  5:03 PM      Failed - Valid encounter within last 12 months    Recent Outpatient Visits   None     Future Appointments             In 3 weeks [name], MD [facility] at [facility], [facility]            Failed - Lipid Panel in normal range within the last 12 months    Cholesterol, Total  Date Value Ref Range Status  05/25/2023 171 100 - 199 mg/dL Final   LDL Chol Calc (NIH)  Date Value Ref Range Status  05/25/2023 102 (H) 0 - 99 mg/dL Final   HDL  Date Value Ref Range Status  05/25/2023 46 >39 mg/dL Final   Triglycerides  Date Value Ref Range Status  05/25/2023 132 0 - 149 mg/dL Final         Passed - Patient is not pregnant

## 2024-01-03 ENCOUNTER — Other Ambulatory Visit: Payer: Self-pay | Admitting: Internal Medicine

## 2024-01-03 DIAGNOSIS — E118 Type 2 diabetes mellitus with unspecified complications: Secondary | ICD-10-CM

## 2024-01-03 NOTE — Telephone Encounter (Signed)
 Requested Prescriptions  Pending Prescriptions Disp Refills   FARXIGA  5 MG TABS tablet [Pharmacy Med Name: Farxiga  5 MG Oral Tablet] 90 tablet 0    Sig: TAKE 1 TABLET BY MOUTH DAILY  BEFORE BREAKFAST     Endocrinology:  Diabetes - SGLT2 Inhibitors Failed - 01/03/2024  5:55 PM      Failed - Valid encounter within last 6 months    Recent Outpatient Visits   None     Future Appointments             In 3 weeks [name], MD [facility] at [facility], [facility]            Passed - Cr in normal range and within 360 days    Creatinine, Ser  Date Value Ref Range Status  05/25/2023 0.88 0.57 - 1.00 mg/dL Final   Creatinine, Urine  Date Value Ref Range Status  01/01/2022 67  Final         Passed - HBA1C is between 0 and 7.9 and within 180 days    Hemoglobin A1C  Date Value Ref Range Status  09/26/2023 7.7 (A) 4.0 - 5.6 % Final  05/04/2022 7.0  Final   Hgb A1c MFr Bld  Date Value Ref Range Status  05/25/2023 7.7 (H) 4.8 - 5.6 % Final    Comment:             Prediabetes: 5.7 - 6.4          Diabetes: >6.4          Glycemic control for adults with diabetes: <7.0          Passed - eGFR in normal range and within 360 days    GFR calc Af Amer  Date Value Ref Range Status  12/02/2015 >60 >60 mL/min Final    Comment:    (NOTE) The eGFR has been calculated using the CKD EPI equation. This calculation has not been validated in all clinical situations. eGFR's persistently <60 mL/min signify possible Chronic Kidney Disease.    GFR, Estimated  Date Value Ref Range Status  05/19/2022 >60 >60 mL/min Final    Comment:    (NOTE) Calculated using the CKD-EPI Creatinine Equation (2021)    eGFR  Date Value Ref Range Status  05/25/2023 69 >59 mL/min/1.73 Final

## 2024-01-08 ENCOUNTER — Encounter: Payer: Self-pay | Admitting: Emergency Medicine

## 2024-01-08 ENCOUNTER — Ambulatory Visit
Admission: EM | Admit: 2024-01-08 | Discharge: 2024-01-08 | Disposition: A | Discharge status: Home / Self Care | Attending: Physician Assistant | Admitting: Physician Assistant

## 2024-01-08 DIAGNOSIS — B3731 Acute candidiasis of vulva and vagina: Secondary | ICD-10-CM | POA: Insufficient documentation

## 2024-01-08 DIAGNOSIS — N760 Acute vaginitis: Secondary | ICD-10-CM | POA: Diagnosis not present

## 2024-01-08 DIAGNOSIS — N898 Other specified noninflammatory disorders of vagina: Secondary | ICD-10-CM | POA: Insufficient documentation

## 2024-01-08 MED ORDER — FLUCONAZOLE 150 MG PO TABS: ORAL_TABLET | ORAL | 1 refills | Status: DC

## 2024-01-08 MED ORDER — NYSTATIN 100000 UNIT/GM EX OINT: 1.0000 | TOPICAL_OINTMENT | Freq: Two times a day (BID) | CUTANEOUS | 0 refills | Status: AC

## 2024-01-08 NOTE — Discharge Instructions (Signed)

## 2024-01-08 NOTE — ED Provider Notes (Signed)
 [facility]    CSN: [id number] Arrival date & time: 01/08/24  1218      History   Chief Complaint Chief Complaint  Patient presents with   Vaginal Discharge    HPI Samantha Welch is a 73 y.o. female with history of diabetes, hyperlipidemia, GERD, recurrent UTIs, IBS and tobacco abuse.   Patient presents today for 3-4 day history of vaginal itching and discharge. Patient is confident she has a yeast infection.  Per chart review, patient treated on 12/02/23 with diflucan  for yeast infection. Patient says symptoms got better but she does not believe the course of meds was long enough. She took 3 doses of diflucan .   No urinary symptoms or abdominal pain.   HPI  Past Medical History:  Diagnosis Date   Anxiety    Arthritis    Cancer (HCC)    skin ca on face   Chronic kidney disease    Left kidney stone   COPD (chronic obstructive pulmonary disease) (HCC)    Depression    Diabetes mellitus without complication (HCC)    GERD (gastroesophageal reflux disease)    Hyperlipemia    Stomach ulcer    Tobacco abuse disorder     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   Recurrent UTI 02/10/2016   Major depression single episode, in partial remission (HCC) 03/31/2015   Hyperlipidemia associated with type 2 diabetes mellitus (HCC) 07/25/2014   Type II diabetes mellitus with complication (HCC) 07/25/2014   GERD (gastroesophageal reflux disease) 04/23/2011    Past Surgical History:  Procedure Laterality Date   BREAST BIOPSY Right 08/08/2023   rt br calcs, ribbon marker, path pending   BREAST BIOPSY Right 08/08/2023   MM RT BREAST BX W LOC DEV 1ST LESION IMAGE BX SPEC STEREO GUIDE 08/08/2023 [facility]   COLONOSCOPY WITH PROPOFOL  N/A 10/05/2021   Procedure: COLONOSCOPY WITH PROPOFOL ;  Surgeon:  Quintin Buckle, DO;  Location: [facility] END[facility]COPY;  Service: Gastroenterology;  Laterality: N/A;   CYSTOSCOPY W/ URETERAL STENT PLACEMENT Left 12/15/2015   Procedure: CYSTOSCOPY WITH STENT REPLACEMENT;  Surgeon: Dustin Gimenez, MD;  Location: [facility];  Service: Urology;  Laterality: Left;   CYSTOSCOPY WITH STENT PLACEMENT Left 11/16/2015   Procedure: CYSTOSCOPY WITH LEFT STENT PLACEMENT, LEFT RETROGRADE;  Surgeon: Rodell Citrin, MD;  Location: [facility];  Service: Urology;  Laterality: Left;   FOOT SURGERY Bilateral    KNEE ARTHROSCOPY WITH MEDIAL MENISECTOMY Left 03/20/2020   Procedure: knee arthroscopic partial medial AND lateral meniscectomy;  Surgeon: Lorri Rota, MD;  Location: [facility];  Service: Orthopedics;  Laterality: Left;  Diabetic - oral meds   TUBAL LIGATION     URETEROSCOPY WITH HOLMIUM LASER LITHOTRIPSY Left 12/15/2015   Procedure: URETEROSCOPY WITH HOLMIUM LASER LITHOTRIPSY;  Surgeon: Dustin Gimenez, MD;  Location: [facility];  Service: Urology;  Laterality: Left;    OB History     Gravida  2   Para  2   Term  2   Preterm  0   AB  0   Living  0      SAB  0   IAB  0   Ectopic  0   Multiple  0   Live Births               Home Medications  Prior to Admission medications   Medication Sig Start Date End Date Taking? Authorizing Provider  fluconazole  (DIFLUCAN ) 150 MG tablet Take 1 tab po q72hr prn yeast infection 01/08/24  Yes Nancy Axon B, PA-C  nystatin ointment (MYCOSTATIN) Apply 1 Application topically 2 (two) times daily. 01/08/24  Yes Floydene Hy, PA-C  aspirin  81 MG chewable tablet Chew 81 mg by mouth daily.    [provider]  celecoxib (CELEBREX) 200 MG capsule Take 200 mg by mouth 2 (two) times daily. 05/09/23   [provider]  clonazePAM  (KLONOPIN ) 1 MG tablet Take 1 mg by mouth 2 (two) times daily.    [provider]  conjugated estrogens  (PREMARIN ) vaginal cream Place 1  Applicatorful vaginally daily. Apply 0.5mg  (pea-sized amount)  just inside the vaginal introitus with a finger-tip on  Monday, Wednesday and Friday nights. 10/31/23   Matilde Son A, PA-C  dicyclomine (BENTYL) 10 MG capsule Take 10 mg by mouth 4 (four) times daily -  before meals and at bedtime.    [provider]  FARXIGA  5 MG TABS tablet TAKE 1 TABLET BY MOUTH DAILY  BEFORE BREAKFAST 01/03/24   [name], MD  FLUoxetine (PROZAC) 20 MG capsule Take 20 mg by mouth daily. 10/18/23   [provider]  gabapentin (NEURONTIN) 300 MG capsule Take 300 mg by mouth at bedtime as needed. 05/06/23   [provider]  glucose blood test strip     [provider]  meclizine  (ANTIVERT ) 12.5 MG tablet Take 1 tablet (12.5 mg total) by mouth 3 (three) times daily as needed for dizziness. 05/25/23   [name], MD  metFORMIN  (GLUCOPHAGE ) 1000 MG tablet Take 0.5 tablets (500 mg total) by mouth 2 (two) times daily with a meal. 10/25/23   [name], MD  methenamine  (HIPREX ) 1 g tablet Take 1 tablet (1 g total) by mouth 2 (two) times daily with a meal. 10/31/23   McGowan, Cathleen Coach A, PA-C  ondansetron  (ZOFRAN ) 4 MG tablet Take 1 tablet (4 mg total) by mouth every 8 (eight) hours as needed for nausea or vomiting. 10/25/23   [name], MD  pantoprazole  (PROTONIX ) 40 MG tablet TAKE 1 TABLET BY MOUTH AT  BEDTIME 07/14/23   [name], MD  phenazopyridine  (PYRIDIUM ) 200 MG tablet Take 1 tablet (200 mg total) by mouth 3 (three) times daily. 12/02/23   Kent Pear, NP  pravastatin  (PRAVACHOL ) 20 MG tablet TAKE 1 TABLET BY MOUTH AT  BEDTIME 01/02/24   Berglund, Laura H, MD  sulfamethoxazole -trimethoprim  (BACTRIM  DS) 800-160 MG tablet Take 1 tablet by mouth every 12 (twelve) hours. 11/02/23   McGowan, Danne Dustman, PA-C  tiZANidine  (ZANAFLEX ) 4 MG tablet Take 1 tablet (4 mg total) by mouth every 6 (six) hours as needed for muscle spasms. 05/19/22   Brimage, Vondra, DO   traMADol  (ULTRAM ) 50 MG tablet Take by mouth every 6 (six) hours as needed.    [provider]  traZODone (DESYREL) 50 MG tablet Take 50-100 mg by mouth at bedtime.    [provider]  TRULANCE  3 MG TABS TAKE 1 TABLET BY MOUTH ONCE DAILY AT  6AM 09/29/23   [name], MD    Family History Family History  Problem Relation Age of Onset   Colon cancer Brother    Bladder Cancer Neg Hx    Kidney cancer Neg Hx    Prostate cancer Neg Hx    Breast cancer Neg Hx  Social History Social History   Tobacco Use   Smoking status: Every Day    Current packs/day: 0.50    Average packs/day: 0.5 packs/day for 20.0 years (10.0 ttl pk-yrs)    Types: Cigarettes    Passive exposure: Current   Smokeless tobacco: Never  Vaping Use   Vaping status: Never Used  Substance Use Topics   Alcohol use: No    Alcohol/week: 0.0 standard drinks of alcohol   Drug use: No     Allergies   Venlafaxine    Review of Systems Review of Systems  Constitutional:  Negative for fatigue and fever.  Gastrointestinal:  Negative for abdominal pain.  Genitourinary:  Positive for vaginal discharge. Negative for dysuria, flank pain, frequency, hematuria, urgency, vaginal bleeding and vaginal pain.  Musculoskeletal:  Negative for back pain.  Skin:  Negative for rash.     Physical Exam Triage Vital Signs ED Triage Vitals [01/08/24 1239]  Encounter Vitals Group     BP      Systolic BP Percentile      Diastolic BP Percentile      Pulse      Resp      Temp      Temp src      SpO2      Weight 143 lb 1.3 oz (64.9 kg)     Height 5\' 2"  (1.575 m)     Head Circumference      Peak Flow      Pain Score 0     Pain Loc      Pain Education      Exclude from Growth Chart    No data found.  Updated Vital Signs BP 135/70 (BP Location: Left Arm)   Pulse 69   Temp 97.7 F (36.5 C) (Oral)   Resp 15   Ht 5\' 2"  (1.575 m)   Wt 143 lb 1.3 oz (64.9 kg)   SpO2 95%   BMI 26.17 kg/m        Physical Exam Vitals and nursing note reviewed.  Constitutional:      General: She is not in acute distress.    Appearance: Normal appearance. She is not ill-appearing or toxic-appearing.  HENT:     Head: Normocephalic and atraumatic.  Eyes:     General: No scleral icterus.       Right eye: No discharge.        Left eye: No discharge.     Conjunctiva/sclera: Conjunctivae normal.  Cardiovascular:     Rate and Rhythm: Normal rate and regular rhythm.  Pulmonary:     Effort: Pulmonary effort is normal. No respiratory distress.  Abdominal:     Palpations: Abdomen is soft.     Tenderness: There is no abdominal tenderness.  Musculoskeletal:     Cervical back: Neck supple.  Skin:    General: Skin is dry.  Neurological:     General: No focal deficit present.     Mental Status: She is alert. Mental status is at baseline.     Motor: No weakness.     Gait: Gait normal.  Psychiatric:        Mood and Affect: Mood normal.        Behavior: Behavior normal.      UC Treatments / Results  Labs (all labs ordered are listed, but only abnormal results are displayed) Labs Reviewed  CERVICOVAGINAL ANCILLARY ONLY    EKG   Radiology No results found.  Procedures Procedures (including critical care time)  Medications Ordered in UC Medications - No data to display  Initial Impression / Assessment and Plan / UC Course  I have reviewed the triage vital signs and the nursing notes.  Pertinent labs & imaging results that were available during my care of the patient were reviewed by me and considered in my medical decision making (see chart for details).   72 y/o female presents for 3-4 day history of vaginal itching and discharge. Seen here last month for the same and was positive for candida. Treated with diflucan .   Reviewed previous lab results from last month along with progress note. Also reviewed previous encounters where patient was treated for recurrent yeast infection and  diflucan  was most often given.  Patient performs vaginal self swab today. Testing for BV and yeast.   Will treat patient again with diflucan . Add nystatin ointment. Will amend treatment base on lab results if necessary. If not improving, should see PCP or GYN for recurrent yeast infection, refractory to diflucan .   Final Clinical Impressions(s) / UC Diagnoses   Final diagnoses:  Yeast vaginitis  Vaginal itching  Recurrent vaginitis     Discharge Instructions      The most common types of vaginal infections are yeast infections and bacterial vaginosis. Neither of which are really considered to be sexually transmitted. Often a pH swab or wet prep is performed and if abnormal may reveal either type of infection. Begin metronidazole if prescribed for possible BV infection. If there is concern for yeast infection, fluconazole  is often prescribed . Take this as directed. You may also apply topical miconazole (can be purchased OTC) externally for relief of itching. Increase rest and fluid intake. If labs sent out, we will call within 2-5 days with results and amend treatment if necessary. Always try to use pH balanced washes/wipes, urinate after intercourse, stay hydrated, and take probiotics if you are prone to vaginal infections. Return or see PCP or gynecologist for new/worsening infections.       ED Prescriptions     Medication Sig Dispense Auth. Provider   fluconazole  (DIFLUCAN ) 150 MG tablet Take 1 tab po q72hr prn yeast infection 3 tablet Nancy Axon B, PA-C   nystatin ointment (MYCOSTATIN) Apply 1 Application topically 2 (two) times daily. 30 g Samantha Welch      PDMP not reviewed this encounter.   Floydene Hy, PA-C 01/08/24 1326

## 2024-01-08 NOTE — ED Triage Notes (Signed)
 Patient c/o vaginal itching and discharge that started 3-4 days ago.

## 2024-01-09 LAB — CERVICOVAGINAL ANCILLARY ONLY
Bacterial Vaginitis (gardnerella): NEGATIVE
Candida Glabrata: POSITIVE — AB
Candida Vaginitis: POSITIVE — AB
Comment: NEGATIVE
Comment: NEGATIVE
Comment: NEGATIVE

## 2024-01-24 ENCOUNTER — Encounter: Payer: Self-pay | Admitting: Internal Medicine

## 2024-01-24 ENCOUNTER — Ambulatory Visit (INDEPENDENT_AMBULATORY_CARE_PROVIDER_SITE_OTHER): Payer: Self-pay | Admitting: Internal Medicine

## 2024-01-24 ENCOUNTER — Telehealth: Payer: Self-pay | Admitting: Internal Medicine

## 2024-01-24 VITALS — BP 126/64 | HR 76 | Ht 62.0 in | Wt 147.1 lb

## 2024-01-24 DIAGNOSIS — Z7984 Long term (current) use of oral hypoglycemic drugs: Secondary | ICD-10-CM | POA: Diagnosis not present

## 2024-01-24 DIAGNOSIS — E118 Type 2 diabetes mellitus with unspecified complications: Secondary | ICD-10-CM | POA: Diagnosis not present

## 2024-01-24 DIAGNOSIS — F324 Major depressive disorder, single episode, in partial remission: Secondary | ICD-10-CM

## 2024-01-24 DIAGNOSIS — N390 Urinary tract infection, site not specified: Secondary | ICD-10-CM | POA: Diagnosis not present

## 2024-01-24 NOTE — Progress Notes (Signed)
 Date:  01/24/2024   Name:  Samantha Welch   DOB:  April 26, 1951   MRN:  [id number]   Chief Complaint: Diabetes  Diabetes She presents for her follow-up diabetic visit. She has type 2 diabetes mellitus. Hypoglycemia symptoms include nervousness/anxiousness. Pertinent negatives for hypoglycemia include no headaches or tremors. Associated symptoms include fatigue. Pertinent negatives for diabetes include no chest pain, no polydipsia and no polyuria. Current diabetic treatments: MTF and Farxiga .  Urinary Tract Infection  This is a recurrent (last in march but having some lower abdominal discomfort) problem. The patient is experiencing no pain. There has been no fever. Pertinent negatives include no chills, frequency or hematuria. She has tried antibiotics for the symptoms.  Vaginal Itching The patient's primary symptoms include genital itching. This is a new problem. The problem has been rapidly improving (since taking Diflucan  several weeks ago). Pertinent negatives include no abdominal pain, chills, constipation, dysuria, fever, frequency, headaches or hematuria.  Depression        This is a chronic problem.The problem is unchanged.  Associated symptoms include fatigue.  Associated symptoms include no appetite change, no headaches and no suicidal ideas.  Past treatments include SSRIs - Selective serotonin reuptake inhibitors and other medications.  Compliance with treatment is variable.  Previous treatment provided mild relief.   Review of Systems  Constitutional:  Positive for fatigue. Negative for appetite change, chills, diaphoresis, fever and unexpected weight change.  HENT:  Negative for tinnitus and trouble swallowing.   Eyes:  Negative for visual disturbance.  Respiratory:  Negative for cough, chest tightness and shortness of breath.   Cardiovascular:  Negative for chest pain, palpitations and leg swelling.  Gastrointestinal:  Negative for abdominal pain and constipation.  Endocrine:  Negative for polydipsia and polyuria.  Genitourinary:  Negative for dysuria, frequency and hematuria.  Musculoskeletal:  Negative for arthralgias and joint swelling.  Neurological:  Negative for tremors, numbness and headaches.  Psychiatric/Behavioral:  Positive for depression, dysphoric mood and sleep disturbance. Negative for suicidal ideas. The patient is nervous/anxious.      Lab Results  Component Value Date   NA 139 05/25/2023   K 4.6 05/25/2023   CO2 25 05/25/2023   GLUCOSE 103 (H) 05/25/2023   BUN 13 05/25/2023   CREATININE 0.88 05/25/2023   CALCIUM 10.1 05/25/2023   EGFR 69 05/25/2023   GFRNONAA >60 05/19/2022   Lab Results  Component Value Date   CHOL 171 05/25/2023   HDL 46 05/25/2023   LDLCALC 102 (H) 05/25/2023   TRIG 132 05/25/2023   CHOLHDL 3.7 05/25/2023   No results found for: "TSH" Lab Results  Component Value Date   HGBA1C 7.7 (A) 09/26/2023   Lab Results  Component Value Date   WBC 11.1 (H) 09/21/2022   HGB 15.4 09/21/2022   HCT 46.3 09/21/2022   MCV 93 09/21/2022   PLT 319 09/21/2022   Lab Results  Component Value Date   ALT 17 05/25/2023   AST 15 05/25/2023   ALKPHOS 50 05/25/2023   BILITOT 0.5 05/25/2023   No results found for: "25OHVITD2", "25OHVITD3", "VD25OH"   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   Recurrent UTI 02/10/2016   Major depression single episode, in partial remission (HCC) 03/31/2015   Hyperlipidemia associated with type 2 diabetes mellitus (HCC) 07/25/2014   Type II diabetes  mellitus with complication (HCC) 07/25/2014   GERD (gastroesophageal reflux disease) 04/23/2011    Allergies  Allergen Reactions   Venlafaxine  Nausea Only    Past Surgical History:  Procedure Laterality Date   BREAST BIOPSY Right 08/08/2023   rt br calcs, ribbon marker,  path pending   BREAST BIOPSY Right 08/08/2023   MM RT BREAST BX W LOC DEV 1ST LESION IMAGE BX SPEC STEREO GUIDE 08/08/2023 [facility]   COLONOSCOPY WITH PROPOFOL  N/A 10/05/2021   Procedure: COLONOSCOPY WITH PROPOFOL ;  Surgeon: Quintin Buckle, DO;  Location: [facility];  Service: Gastroenterology;  Laterality: N/A;   CYSTOSCOPY W/ URETERAL STENT PLACEMENT Left 12/15/2015   Procedure: CYSTOSCOPY WITH STENT REPLACEMENT;  Surgeon: Dustin Gimenez, MD;  Location: [facility];  Service: Urology;  Laterality: Left;   CYSTOSCOPY WITH STENT PLACEMENT Left 11/16/2015   Procedure: CYSTOSCOPY WITH LEFT STENT PLACEMENT, LEFT RETROGRADE;  Surgeon: Rodell Citrin, MD;  Location: [facility];  Service: Urology;  Laterality: Left;   FOOT SURGERY Bilateral    KNEE ARTHROSCOPY WITH MEDIAL MENISECTOMY Left 03/20/2020   Procedure: knee arthroscopic partial medial AND lateral meniscectomy;  Surgeon: Lorri Rota, MD;  Location: [facility];  Service: Orthopedics;  Laterality: Left;  Diabetic - oral meds   TUBAL LIGATION     URETEROSCOPY WITH HOLMIUM LASER LITHOTRIPSY Left 12/15/2015   Procedure: URETEROSCOPY WITH HOLMIUM LASER LITHOTRIPSY;  Surgeon: Dustin Gimenez, MD;  Location: [facility];  Service: Urology;  Laterality: Left;    Social History   Tobacco Use   Smoking status: Every Day    Current packs/day: 0.50    Average packs/day: 0.5 packs/day for 45.4 years (22.7 ttl pk-yrs)    Types: Cigarettes    Start date: 1980    Passive exposure: Current   Smokeless tobacco: Never  Vaping Use   Vaping status: Never Used  Substance Use Topics   Alcohol use: No    Alcohol/week: 0.0 standard drinks of alcohol   Drug use: No     Medication list has been reviewed and updated.  Current Meds  Medication Sig   aspirin  81 MG chewable tablet Chew 81 mg by mouth daily.   celecoxib (CELEBREX) 200 MG capsule Take 200 mg by mouth 2 (two) times daily.   clonazePAM  (KLONOPIN ) 1 MG tablet  Take 1 mg by mouth 2 (two) times daily.   conjugated estrogens  (PREMARIN ) vaginal cream Place 1 Applicatorful vaginally daily. Apply 0.5mg  (pea-sized amount)  just inside the vaginal introitus with a finger-tip on  Monday, Wednesday and Friday nights.   FARXIGA  5 MG TABS tablet TAKE 1 TABLET BY MOUTH DAILY  BEFORE BREAKFAST   FLUoxetine (PROZAC) 20 MG capsule Take 20 mg by mouth daily.   gabapentin (NEURONTIN) 300 MG capsule Take 300 mg by mouth at bedtime as needed.   glucose blood test strip    meclizine  (ANTIVERT ) 12.5 MG tablet Take 1 tablet (12.5 mg total) by mouth 3 (three) times daily as needed for dizziness.   metFORMIN  (GLUCOPHAGE ) 1000 MG tablet Take 0.5 tablets (500 mg total) by mouth 2 (two) times daily with a meal.   nystatin  ointment (MYCOSTATIN ) Apply 1 Application topically 2 (two) times daily.   ondansetron  (ZOFRAN ) 4 MG tablet Take 1 tablet (4 mg total) by mouth every 8 (eight) hours as needed for nausea or vomiting.   pantoprazole  (PROTONIX ) 40 MG tablet TAKE 1 TABLET BY MOUTH AT  BEDTIME   pravastatin  (PRAVACHOL ) 20 MG tablet TAKE 1 TABLET BY MOUTH AT  BEDTIME  traZODone (DESYREL) 50 MG tablet Take 50-100 mg by mouth at bedtime.   TRULANCE  3 MG TABS TAKE 1 TABLET BY MOUTH ONCE DAILY AT  6AM       01/24/2024    2:58 PM 09/26/2023    2:49 PM 05/25/2023    2:37 PM 01/21/2023    1:22 PM  GAD 7 : Generalized Anxiety Score  Nervous, Anxious, on Edge 3 2 0 2  Control/stop worrying 3 2 0 2  Worry too much - different things 3 2 0 2  Trouble relaxing 3 0 0 2  Restless 2 0 0 2  Easily annoyed or irritable 0 0 0 2  Afraid - awful might happen 0 0 0 2  Total GAD 7 Score 14 6 0 14  Anxiety Difficulty Somewhat difficult Not difficult at all Not difficult at all Somewhat difficult       01/24/2024    2:57 PM 09/26/2023    2:48 PM 05/25/2023    2:37 PM  Depression screen PHQ 2/9  Decreased Interest 3 1 0  Down, Depressed, Hopeless 1 1 0  PHQ - 2 Score 4 2 0  Altered sleeping  0 1 0  Tired, decreased energy 3 1 0  Change in appetite 0 0 0  Feeling bad or failure about yourself  0 0 0  Trouble concentrating 0 0 0  Moving slowly or fidgety/restless 2 0 0  Suicidal thoughts 0 0 0  PHQ-9 Score 9 4 0  Difficult doing work/chores Not difficult at all Not difficult at all Not difficult at all    BP Readings from Last 3 Encounters:  01/24/24 126/64  01/08/24 135/70  12/02/23 (!) 144/73    Physical Exam Vitals and nursing note reviewed.  Constitutional:      General: She is not in acute distress.    Appearance: Normal appearance. She is well-developed.  HENT:     Head: Normocephalic and atraumatic.  Cardiovascular:     Rate and Rhythm: Normal rate and regular rhythm.  Pulmonary:     Effort: Pulmonary effort is normal. No respiratory distress.     Breath sounds: No wheezing or rhonchi.  Abdominal:     General: Abdomen is flat.     Palpations: Abdomen is soft.     Tenderness: There is abdominal tenderness in the suprapubic area. There is no right CVA tenderness, left CVA tenderness, guarding or rebound.  Musculoskeletal:     Cervical back: Normal range of motion.     Right lower leg: No edema.     Left lower leg: No edema.  Skin:    General: Skin is warm and dry.     Findings: No rash.  Neurological:     Mental Status: She is alert and oriented to person, place, and time.  Psychiatric:        Attention and Perception: Attention and perception normal.        Mood and Affect: Mood is depressed.        Behavior: Behavior normal.        Thought Content: Thought content does not include suicidal ideation. Thought content does not include suicidal plan.     Wt Readings from Last 3 Encounters:  01/24/24 147 lb 2 oz (66.7 kg)  01/08/24 143 lb 1.3 oz (64.9 kg)  09/26/23 143 lb (64.9 kg)    BP 126/64   Pulse 76   Ht 5\' 2"  (1.575 m)   Wt 147 lb 2 oz (66.7 kg)  SpO2 97%   BMI 26.91 kg/m   Assessment and Plan:  Problem List Items Addressed This  Visit       Unprioritized   Major depression single episode, in partial remission (HCC) (Chronic)   Clinically worsening despite Prozac and clonazepam  - compliance is poor due to side effects.   No SI or HI on evaluation. She has been followed for years by [facility] Dr. Juel Nutley who has now left. Strongly encourage her to follow up with a new provider and discuss medication issues      Chronic UTI (Chronic)   Will recheck UA and micro at the lab She recently completed antibiotics complicated by vaginal yeast infection      Type II diabetes mellitus with complication (HCC) - Primary (Chronic)   Blood sugars have been stable.  No recent hypoglycemic events requiring assistance. Currently medications are MTF and Farxiga . Lab Results  Component Value Date   HGBA1C 7.7 (A) 09/26/2023   Last visit no changes were made.       Relevant Orders   Hemoglobin A1c   Microalbumin / creatinine urine ratio   Recurrent UTI   Relevant Orders   Urinalysis, Routine w reflex microscopic   Other Visit Diagnoses       Long term current use of oral hypoglycemic drug           Return in about 4 months (around 05/26/2024) for CPX.    [name], MD [facility] [place]

## 2024-01-24 NOTE — Telephone Encounter (Signed)
 Patient came back into the office and stated that she just had a urine test done, and if she needs medication to please send it to [facility] in [place]. FYI.

## 2024-01-24 NOTE — Assessment & Plan Note (Signed)
 Blood sugars have been stable.  No recent hypoglycemic events requiring assistance. Currently medications are MTF and Farxiga . Lab Results  Component Value Date   HGBA1C 7.7 (A) 09/26/2023   Last visit no changes were made.

## 2024-01-24 NOTE — Assessment & Plan Note (Signed)
 Will recheck UA and micro at the lab She recently completed antibiotics complicated by vaginal yeast infection

## 2024-01-24 NOTE — Assessment & Plan Note (Signed)
 Clinically worsening despite Prozac and clonazepam  - compliance is poor due to side effects.   No SI or HI on evaluation. She has been followed for years by [facility] Dr. Juel Nutley who has now left. Strongly encourage her to follow up with a new provider and discuss medication issues

## 2024-01-25 LAB — HEMOGLOBIN A1C
Est. average glucose Bld gHb Est-mCnc: 200 mg/dL
Hgb A1c MFr Bld: 8.6 % — ABNORMAL HIGH (ref 4.8–5.6)

## 2024-01-25 LAB — MICROALBUMIN / CREATININE URINE RATIO
Creatinine, Urine: 37.3 mg/dL
Microalb/Creat Ratio: 86 mg/g{creat} — ABNORMAL HIGH (ref 0–29)
Microalbumin, Urine: 32 ug/mL

## 2024-01-25 LAB — URINALYSIS, ROUTINE W REFLEX MICROSCOPIC
Bilirubin, UA: NEGATIVE
Ketones, UA: NEGATIVE
Nitrite, UA: POSITIVE — AB
Protein,UA: NEGATIVE
Specific Gravity, UA: >=1.03 — AB (ref 1.005–1.030)
Urobilinogen, Ur: 0.2 mg/dL (ref 0.2–1.0)
pH, UA: 6 (ref 5.0–7.5)

## 2024-01-25 LAB — MICROSCOPIC EXAMINATION
Casts: NONE SEEN /LPF
WBC, UA: >30 /HPF — AB (ref 0–5)

## 2024-01-29 ENCOUNTER — Ambulatory Visit: Payer: Self-pay | Admitting: Internal Medicine

## 2024-01-29 ENCOUNTER — Other Ambulatory Visit: Payer: Self-pay | Admitting: Internal Medicine

## 2024-01-29 DIAGNOSIS — N390 Urinary tract infection, site not specified: Secondary | ICD-10-CM

## 2024-01-29 DIAGNOSIS — E118 Type 2 diabetes mellitus with unspecified complications: Secondary | ICD-10-CM

## 2024-01-29 MED ORDER — SULFAMETHOXAZOLE-TRIMETHOPRIM 800-160 MG PO TABS: 1.0000 | ORAL_TABLET | Freq: Two times a day (BID) | ORAL | 0 refills | Status: AC

## 2024-01-29 MED ORDER — FLUCONAZOLE 100 MG PO TABS: 100.0000 mg | ORAL_TABLET | Freq: Once | ORAL | 0 refills | Status: AC

## 2024-01-29 MED ORDER — METFORMIN HCL 1000 MG PO TABS: ORAL_TABLET | ORAL | 0 refills | Status: DC

## 2024-01-29 NOTE — Progress Notes (Unsigned)
 Date:  01/29/2024   Name:  Samantha Welch   DOB:  Feb 14, 1951   MRN:  [id number]   Chief Complaint: No chief complaint on file.  HPI  Review of Systems   Lab Results  Component Value Date   NA 139 05/25/2023   K 4.6 05/25/2023   CO2 25 05/25/2023   GLUCOSE 103 (H) 05/25/2023   BUN 13 05/25/2023   CREATININE 0.88 05/25/2023   CALCIUM 10.1 05/25/2023   EGFR 69 05/25/2023   GFRNONAA >60 05/19/2022   Lab Results  Component Value Date   CHOL 171 05/25/2023   HDL 46 05/25/2023   LDLCALC 102 (H) 05/25/2023   TRIG 132 05/25/2023   CHOLHDL 3.7 05/25/2023   No results found for: "TSH" Lab Results  Component Value Date   HGBA1C 8.6 (H) 01/24/2024   Lab Results  Component Value Date   WBC 11.1 (H) 09/21/2022   HGB 15.4 09/21/2022   HCT 46.3 09/21/2022   MCV 93 09/21/2022   PLT 319 09/21/2022   Lab Results  Component Value Date   ALT 17 05/25/2023   AST 15 05/25/2023   ALKPHOS 50 05/25/2023   BILITOT 0.5 05/25/2023   No results found for: "25OHVITD2", "25OHVITD3", "VD25OH"   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   Recurrent UTI 02/10/2016   Major depression single episode, in partial remission (HCC) 03/31/2015   Hyperlipidemia associated with type 2 diabetes mellitus (HCC) 07/25/2014   Type II diabetes mellitus with complication (HCC) 07/25/2014   GERD (gastroesophageal reflux disease) 04/23/2011    Allergies  Allergen Reactions   Venlafaxine  Nausea Only    Past Surgical History:  Procedure Laterality Date   BREAST BIOPSY Right 08/08/2023   rt br calcs, ribbon marker, path pending   BREAST BIOPSY Right 08/08/2023   MM RT BREAST BX W LOC DEV 1ST LESION IMAGE BX SPEC STEREO GUIDE 08/08/2023 [facility]   COLONOSCOPY WITH PROPOFOL  N/A 10/05/2021   Procedure: COLONOSCOPY WITH PROPOFOL ;   Surgeon: Quintin Buckle, DO;  Location: [facility] END[facility]COPY;  Service: Gastroenterology;  Laterality: N/A;   CYSTOSCOPY W/ URETERAL STENT PLACEMENT Left 12/15/2015   Procedure: CYSTOSCOPY WITH STENT REPLACEMENT;  Surgeon: Dustin Gimenez, MD;  Location: [facility];  Service: Urology;  Laterality: Left;   CYSTOSCOPY WITH STENT PLACEMENT Left 11/16/2015   Procedure: CYSTOSCOPY WITH LEFT STENT PLACEMENT, LEFT RETROGRADE;  Surgeon: Rodell Citrin, MD;  Location: [facility];  Service: Urology;  Laterality: Left;   FOOT SURGERY Bilateral    KNEE ARTHROSCOPY WITH MEDIAL MENISECTOMY Left 03/20/2020   Procedure: knee arthroscopic partial medial AND lateral meniscectomy;  Surgeon: Lorri Rota, MD;  Location: [facility];  Service: Orthopedics;  Laterality: Left;  Diabetic - oral meds   TUBAL LIGATION     URETEROSCOPY WITH HOLMIUM LASER LITHOTRIPSY Left 12/15/2015   Procedure: URETEROSCOPY WITH HOLMIUM LASER LITHOTRIPSY;  Surgeon: Dustin Gimenez, MD;  Location: [facility];  Service: Urology;  Laterality: Left;    Social History   Tobacco Use   Smoking status: Every Day    Current packs/day: 0.50    Average packs/day: 0.5 packs/day for 45.4 years (22.7 ttl pk-yrs)    Types: Cigarettes    Start date: 1980    Passive exposure: Current   Smokeless tobacco: Never  Vaping Use   Vaping status: Never  Used  Substance Use Topics   Alcohol use: No    Alcohol/week: 0.0 standard drinks of alcohol   Drug use: No     Medication list has been reviewed and updated.  No outpatient medications have been marked as taking for the 01/29/24 encounter (Orders Only) with [name], MD.       01/24/2024    2:58 PM 09/26/2023    2:49 PM 05/25/2023    2:37 PM 01/21/2023    1:22 PM  GAD 7 : Generalized Anxiety Score  Nervous, Anxious, on Edge 3 2 0 2  Control/stop worrying 3 2 0 2  Worry too much - different things 3 2 0 2  Trouble relaxing 3 0 0 2  Restless 2 0 0 2  Easily annoyed or  irritable 0 0 0 2  Afraid - awful might happen 0 0 0 2  Total GAD 7 Score 14 6 0 14  Anxiety Difficulty Somewhat difficult Not difficult at all Not difficult at all Somewhat difficult       01/24/2024    2:57 PM 09/26/2023    2:48 PM 05/25/2023    2:37 PM  Depression screen PHQ 2/9  Decreased Interest 3 1 0  Down, Depressed, Hopeless 1 1 0  PHQ - 2 Score 4 2 0  Altered sleeping 0 1 0  Tired, decreased energy 3 1 0  Change in appetite 0 0 0  Feeling bad or failure about yourself  0 0 0  Trouble concentrating 0 0 0  Moving slowly or fidgety/restless 2 0 0  Suicidal thoughts 0 0 0  PHQ-9 Score 9 4 0  Difficult doing work/chores Not difficult at all Not difficult at all Not difficult at all    BP Readings from Last 3 Encounters:  01/24/24 126/64  01/08/24 135/70  12/02/23 (!) 144/73    Physical Exam  Wt Readings from Last 3 Encounters:  01/24/24 147 lb 2 oz (66.7 kg)  01/08/24 143 lb 1.3 oz (64.9 kg)  09/26/23 143 lb (64.9 kg)    There were no vitals taken for this visit.  Assessment and Plan:  Problem List Items Addressed This Visit   None   No follow-ups on file.    [name], MD [facility] [place]

## 2024-02-01 ENCOUNTER — Ambulatory Visit (INDEPENDENT_AMBULATORY_CARE_PROVIDER_SITE_OTHER): Admitting: Emergency Medicine

## 2024-02-01 VITALS — Ht 62.0 in | Wt 143.0 lb

## 2024-02-01 DIAGNOSIS — Z0000 Encounter for general adult medical examination without abnormal findings: Secondary | ICD-10-CM

## 2024-02-01 NOTE — Progress Notes (Signed)
 Subjective:   Samantha Welch is a 73 y.o. who presents for a Medicare Wellness preventive visit.  As a reminder, Annual Wellness Visits don't include a physical exam, and some assessments may be limited, especially if this visit is performed virtually. We may recommend an in-person follow-up visit with your provider if needed.  Visit Complete: Virtual I connected with  Norina Beath on 02/01/24 by a audio enabled telemedicine application and verified that I am speaking with the correct person using two identifiers.  Patient Location: Home  Provider Location: Home Office  I discussed the limitations of evaluation and management by telemedicine. The patient expressed understanding and agreed to proceed.  Vital Signs: Because this visit was a virtual/telehealth visit, some criteria may be missing or patient reported. Any vitals not documented were not able to be obtained and vitals that have been documented are patient reported.  VideoDeclined- This patient declined Librarian, academic. Therefore the visit was completed with audio only.  Persons Participating in Visit: Patient.  AWV Questionnaire: No: Patient Medicare AWV questionnaire was not completed prior to this visit.  Cardiac Risk Factors include: advanced age (>103men, >64 women);dyslipidemia;diabetes mellitus;smoking/ tobacco exposure     Objective:     Today's Vitals   02/01/24 1318  Weight: 143 lb (64.9 kg)  Height: 5\' 2"  (1.575 m)  PainSc: 2    Body mass index is 26.16 kg/m.     02/01/2024    1:38 PM 01/08/2024   12:40 PM 12/02/2023    1:13 PM 12/28/2022    9:14 AM 05/19/2022    4:10 PM 10/05/2021    7:52 AM 03/20/2020    9:15 AM  Advanced Directives  Does Patient Have a Medical Advance Directive? Yes No No No No No No  Type of Advance Directive Healthcare Power of Attorney        Does patient want to make changes to medical advance directive? No - Patient declined      Yes  (MAU/Ambulatory/Procedural Areas - Information given)  Copy of Healthcare Power of Attorney in Chart? No - copy requested        Would patient like information on creating a medical advance directive?   No - Patient declined No - Patient declined       Current Medications (verified) Outpatient Encounter Medications as of 02/01/2024  Medication Sig   aspirin  81 MG chewable tablet Chew 81 mg by mouth daily.   celecoxib (CELEBREX) 200 MG capsule Take 200 mg by mouth 2 (two) times daily.   clonazePAM  (KLONOPIN ) 1 MG tablet Take 1 mg by mouth 2 (two) times daily.   conjugated estrogens  (PREMARIN ) vaginal cream Place 1 Applicatorful vaginally daily. Apply 0.5mg  (pea-sized amount)  just inside the vaginal introitus with a finger-tip on  Monday, Wednesday and Friday nights.   dicyclomine (BENTYL) 10 MG capsule Take 10 mg by mouth 4 (four) times daily -  before meals and at bedtime.   FARXIGA  5 MG TABS tablet TAKE 1 TABLET BY MOUTH DAILY  BEFORE BREAKFAST   FLUoxetine (PROZAC) 20 MG capsule Take 20 mg by mouth daily.   gabapentin (NEURONTIN) 300 MG capsule Take 300 mg by mouth at bedtime as needed.   glucose blood test strip    meclizine  (ANTIVERT ) 12.5 MG tablet Take 1 tablet (12.5 mg total) by mouth 3 (three) times daily as needed for dizziness.   metFORMIN  (GLUCOPHAGE ) 1000 MG tablet Take one tablet in the AM and 1/2 tablet in the  PM.   nystatin  ointment (MYCOSTATIN ) Apply 1 Application topically 2 (two) times daily.   ondansetron  (ZOFRAN ) 4 MG tablet Take 1 tablet (4 mg total) by mouth every 8 (eight) hours as needed for nausea or vomiting.   pantoprazole  (PROTONIX ) 40 MG tablet TAKE 1 TABLET BY MOUTH AT  BEDTIME   pravastatin  (PRAVACHOL ) 20 MG tablet TAKE 1 TABLET BY MOUTH AT  BEDTIME   sulfamethoxazole -trimethoprim  (BACTRIM  DS) 800-160 MG tablet Take 1 tablet by mouth 2 (two) times daily for 5 days.   traZODone (DESYREL) 50 MG tablet Take 50-100 mg by mouth at bedtime.   TRULANCE  3 MG TABS TAKE  1 TABLET BY MOUTH ONCE DAILY AT  6AM   No facility-administered encounter medications on file as of 02/01/2024.    Allergies (verified) Venlafaxine    History: Past Medical History:  Diagnosis Date   Anxiety    Arthritis    Cancer (HCC)    skin ca on face   Chronic kidney disease    Left kidney stone   COPD (chronic obstructive pulmonary disease) (HCC)    Depression    Diabetes mellitus without complication (HCC)    GERD (gastroesophageal reflux disease)    Hyperlipemia    Stomach ulcer    Tobacco abuse disorder    Past Surgical History:  Procedure Laterality Date   BREAST BIOPSY Right 08/08/2023   rt br calcs, ribbon marker, path pending   BREAST BIOPSY Right 08/08/2023   MM RT BREAST BX W LOC DEV 1ST LESION IMAGE BX SPEC STEREO GUIDE 08/08/2023 [facility]   COLONOSCOPY WITH PROPOFOL  N/A 10/05/2021   Procedure: COLONOSCOPY WITH PROPOFOL ;  Surgeon: Quintin Buckle, DO;  Location: [facility];  Service: Gastroenterology;  Laterality: N/A;   CYSTOSCOPY W/ URETERAL STENT PLACEMENT Left 12/15/2015   Procedure: CYSTOSCOPY WITH STENT REPLACEMENT;  Surgeon: Dustin Gimenez, MD;  Location: [facility];  Service: Urology;  Laterality: Left;   CYSTOSCOPY WITH STENT PLACEMENT Left 11/16/2015   Procedure: CYSTOSCOPY WITH LEFT STENT PLACEMENT, LEFT RETROGRADE;  Surgeon: Rodell Citrin, MD;  Location: [facility];  Service: Urology;  Laterality: Left;   FOOT SURGERY Bilateral    KNEE ARTHROSCOPY WITH MEDIAL MENISECTOMY Left 03/20/2020   Procedure: knee arthroscopic partial medial AND lateral meniscectomy;  Surgeon: Lorri Rota, MD;  Location: [facility];  Service: Orthopedics;  Laterality: Left;  Diabetic - oral meds   TUBAL LIGATION     URETEROSCOPY WITH HOLMIUM LASER LITHOTRIPSY Left 12/15/2015   Procedure: URETEROSCOPY WITH HOLMIUM LASER LITHOTRIPSY;  Surgeon: Dustin Gimenez, MD;  Location: [facility];  Service: Urology;  Laterality: Left;   Family History   Problem Relation Age of Onset   Heart disease Mother    Diabetes Mother    Emphysema Father    Colon cancer Brother    Bladder Cancer Neg Hx    Kidney cancer Neg Hx    Prostate cancer Neg Hx    Breast cancer Neg Hx    Social History   Socioeconomic History   Marital status: Divorced    Spouse name: Not on file   Number of children: 2   Years of education: Not on file   Highest education level: Not on file  Occupational History   Not on file  Tobacco Use   Smoking status: Every Day    Current packs/day: 0.50    Average packs/day: 0.5 packs/day for 45.4 years (22.7 ttl pk-yrs)    Types: Cigarettes    Start date: 63    Passive  exposure: Current   Smokeless tobacco: Never  Vaping Use   Vaping status: Never Used  Substance and Sexual Activity   Alcohol use: No    Alcohol/week: 0.0 standard drinks of alcohol   Drug use: No   Sexual activity: Not Currently  Other Topics Concern   Not on file  Social History Narrative   1 son living, 1 daughter passed away   Social Drivers of Health   Financial Resource Strain: Low Risk  (02/01/2024)   Overall Financial Resource Strain (CARDIA)    Difficulty of Paying Living Expenses: Not hard at all  Food Insecurity: No Food Insecurity (02/01/2024)   Hunger Vital Sign    Worried About Running Out of Food in the Last Year: Never true    Ran Out of Food in the Last Year: Never true  Transportation Needs: No Transportation Needs (02/01/2024)   PRAPARE - Administrator, Civil Service (Medical): No    Lack of Transportation (Non-Medical): No  Physical Activity: Insufficiently Active (02/01/2024)   Exercise Vital Sign    Days of Exercise per Week: 5 days    Minutes of Exercise per Session: 10 min  Stress: Stress Concern Present (02/01/2024)   [employer] of Occupational Health - Occupational Stress Questionnaire    Feeling of Stress : To some extent  Social Connections: Socially Isolated (02/01/2024)   Social Connection  and Isolation Panel [NHANES]    Frequency of Communication with Friends and Family: More than three times a week    Frequency of Social Gatherings with Friends and Family: Twice a week    Attends Religious Services: Never    Database administrator or Organizations: No    Attends Engineer, structural: Never    Marital Status: Divorced    Tobacco Counseling Ready to quit: Yes Counseling given: Yes    Clinical Intake:  Pre-visit preparation completed: Yes  Pain : 0-10 Pain Score: 2  Pain Type: Chronic pain Pain Location: Knee Pain Orientation: Left Pain Descriptors / Indicators: Aching     BMI - recorded: 26.16 Nutritional Status: BMI 25 -29 Overweight Nutritional Risks: None Diabetes: Yes CBG done?: No Did pt. bring in CBG monitor from home?: No  Lab Results  Component Value Date   HGBA1C 8.6 (H) 01/24/2024   HGBA1C 7.7 (A) 09/26/2023   HGBA1C 7.7 (H) 05/25/2023     How often do you need to have someone help you when you read instructions, pamphlets, or other written materials from your doctor or pharmacy?: 3 - Sometimes (with small print) What is the last grade level you completed in school?: 10th  Interpreter Needed?: No  Information entered by :: Jaunita Messier, CMA   Activities of Daily Living     02/01/2024    1:21 PM  In your present state of health, do you have any difficulty performing the following activities:  Hearing? 0  Vision? 0  Difficulty concentrating or making decisions? 0  Walking or climbing stairs? 1  Comment sometimes due to chronic knee pain  Dressing or bathing? 0  Doing errands, shopping? 0  Preparing Food and eating ? N  Using the Toilet? N  In the past six months, have you accidently leaked urine? N  Do you have problems with loss of bowel control? Y  Comment due to IBS  Managing your Medications? N  Managing your Finances? N  Housekeeping or managing your Housekeeping? N    Patient Care Team: [name], MD  as PCP - General (Internal Medicine) Edwyna Grams, MD as Referring Physician (Psychiatry) Bert Britain, PA-C (Orthopedic Surgery) Spencer Dy, OD (Optometry) Lawerence Pressman, MD as Consulting Physician (Urology) Lorri Rota, MD as Consulting Physician (Orthopedic Surgery) Quintin Buckle, DO as Consulting Physician (Gastroenterology)  Indicate any recent Medical Services you may have received from other than [facility] providers in the past year (date may be approximate).     Assessment:    This is a routine wellness examination for [facility].  Hearing/Vision screen Hearing Screening - Comments:: Denies hearing loss Vision Screening - Comments:: Gets DM eye exam, Dr. Spencer Dy [facility] [place]   Goals Addressed             This Visit's Progress    Quit Smoking         Depression Screen     02/01/2024    1:34 PM 01/24/2024    2:57 PM 09/26/2023    2:48 PM 05/25/2023    2:37 PM 01/21/2023    1:21 PM 12/28/2022    8:57 AM 10/26/2022   11:16 AM  PHQ 2/9 Scores  PHQ - 2 Score 3 4 2  0 6 2 2   PHQ- 9 Score 6 9 4  0 10 4 4     Fall Risk     02/01/2024    1:39 PM 01/24/2024    2:48 PM 09/26/2023    2:48 PM 05/25/2023    2:37 PM 01/21/2023    1:21 PM  Fall Risk   Falls in the past year? 0 0 0 0 0  Number falls in past yr: 0 0 0 0 0  Injury with Fall? 0 0 0 0 0  Risk for fall due to : No Fall Risks No Fall Risks No Fall Risks No Fall Risks No Fall Risks  Follow up Falls evaluation completed Falls evaluation completed Falls evaluation completed Falls evaluation completed Falls evaluation completed    MEDICARE RISK AT HOME:  Medicare Risk at Home Any stairs in or around the home?: Yes If so, are there any without handrails?: No Home free of loose throw rugs in walkways, pet beds, electrical cords, etc?: Yes Adequate lighting in your home to reduce risk of falls?: Yes Life alert?: No Use of a cane, walker or w/c?: No Grab bars in the bathroom?: No Shower chair or bench in shower?:  No Elevated toilet seat or a handicapped toilet?: No  TIMED UP AND GO:  Was the test performed?  No  Cognitive Function: 6CIT completed        02/01/2024    1:40 PM  6CIT Screen  What Year? 0 points  What month? 0 points  What time? 0 points  Count back from 20 4 points  Months in reverse 4 points  Repeat phrase 6 points  Total Score 14 points    Immunizations Immunization History  Administered Date(s) Administered   Influenza,inj,Quad PF,6+ Mos 07/19/2012, 06/20/2013, 07/25/2014, 08/18/2015, 06/17/2016, 06/14/2017, 07/17/2018, 06/28/2019   PNEUMOCOCCAL CONJUGATE-20 02/24/2022   Pneumococcal Conjugate-13 12/20/2016   Pneumococcal Polysaccharide-23 10/20/2011, 10/15/2013, 01/14/2015   Td (Adult),unspecified 04/29/1995   Tdap 02/24/2022   Zoster Recombinant(Shingrix) 02/24/2022, 04/27/2022    Screening Tests Health Maintenance  Topic Date Due   Lung Cancer Screening  Never done   COVID-19 Vaccine (1 - 2024-25 season) 02/09/2024 (Originally 05/15/2023)   OPHTHALMOLOGY EXAM  03/13/2024   INFLUENZA VACCINE  04/13/2024   Diabetic kidney evaluation - eGFR measurement  05/24/2024   FOOT EXAM  05/24/2024  MAMMOGRAM  07/13/2024   HEMOGLOBIN A1C  07/26/2024   Diabetic kidney evaluation - Urine ACR  01/23/2025   Medicare Annual Wellness (AWV)  01/31/2025   Colonoscopy  10/05/2026   DTaP/Tdap/Td (2 - Td or Tdap) 02/25/2032   Pneumonia Vaccine 73+ Years old  Completed   DEXA SCAN  Completed   Hepatitis C Screening  Completed   Zoster Vaccines- Shingrix  Completed   HPV VACCINES  Aged Out   Meningococcal B Vaccine  Aged Out    Health Maintenance  Health Maintenance Due  Topic Date Due   Lung Cancer Screening  Never done   Health Maintenance Items Addressed: See Nurse Notes  Additional Screening:  Vision Screening: Recommended annual ophthalmology exams for early detection of glaucoma and other disorders of the eye.  Dental Screening: Recommended annual dental  exams for proper oral hygiene  Community Resource Referral / Chronic Care Management: CRR required this visit?  No   CCM required this visit?  No   Plan:    I have personally reviewed and noted the following in the patient's chart:   Medical and social history Use of alcohol, tobacco or illicit drugs  Current medications and supplements including opioid prescriptions. Patient is not currently taking opioid prescriptions. Functional ability and status Nutritional status Physical activity Advanced directives List of other physicians Hospitalizations, surgeries, and ER visits in previous 12 months Vitals Screenings to include cognitive, depression, and falls Referrals and appointments  In addition, I have reviewed and discussed with patient certain preventive protocols, quality metrics, and best practice recommendations. A written personalized care plan for preventive services as well as general preventive health recommendations were provided to patient.   Jaunita Messier, CMA   02/01/2024   After Visit Summary: (MyChart) Due to this being a telephonic visit, the after visit summary with patients personalized plan was offered to patient via MyChart   Notes: Please refer to Routing Comments.

## 2024-02-01 NOTE — Patient Instructions (Signed)
 Samantha Welch , Thank you for taking time out of your busy schedule to complete your Annual Wellness Visit with me. I enjoyed our conversation and look forward to speaking with you again next year. I, as well as your care team,  appreciate your ongoing commitment to your health goals. Please review the following plan we discussed and let me know if I can assist you in the future. Your Game plan/ To Do List    Referrals: None  Follow up Visits: Next Medicare AWV with our clinical staff: 02/06/25 @ 1:20pm (phone visit)   Have you seen your provider in the last 6 months (3 months if uncontrolled diabetes)? Yes Next Office Visit with your provider: 05/29/24 @ 1:20 with Dr. Gala Jubilee  Clinician Recommendations:  Aim for 30 minutes of exercise or brisk walking, 6-8 glasses of water, and 5 servings of fruits and vegetables each day.       This is a list of the screening recommended for you and due dates:  Health Maintenance  Topic Date Due   Screening for Lung Cancer  Never done   COVID-19 Vaccine (1 - 2024-25 season) 02/09/2024*   Eye exam for diabetics  03/13/2024   Flu Shot  04/13/2024   Yearly kidney function blood test for diabetes  05/24/2024   Complete foot exam   05/24/2024   Mammogram  07/13/2024   Hemoglobin A1C  07/26/2024   Yearly kidney health urinalysis for diabetes  01/23/2025   Medicare Annual Wellness Visit  01/31/2025   Colon Cancer Screening  10/05/2026   DTaP/Tdap/Td vaccine (2 - Td or Tdap) 02/25/2032   Pneumonia Vaccine  Completed   DEXA scan (bone density measurement)  Completed   Hepatitis C Screening  Completed   Zoster (Shingles) Vaccine  Completed   HPV Vaccine  Aged Out   Meningitis B Vaccine  Aged Out  *Topic was postponed. The date shown is not the original due date.    Advanced directives: (Copy Requested) Please bring a copy of your health care power of attorney and living will to the office to be added to your chart at your convenience. You can mail to [facility] [address] [place], [place] [address] or email to [email or web address] Advance Care Planning is important because it:  [x]  Makes sure you receive the medical care that is consistent with your values, goals, and preferences  [x]  It provides guidance to your family and loved ones and reduces their decisional burden about whether or not they are making the right decisions based on your wishes.  Follow the link provided in your after visit summary or read over the paperwork we have mailed to you to help you started getting your Advance Directives in place. If you need assistance in completing these, please reach out to us  so that we can help you!  See attachments for Preventive Care and Fall Prevention Tips.   Steps to Quit Smoking Smoking tobacco is the leading cause of preventable death. It can affect almost every organ in the body. Smoking puts you and those around you at risk for developing many serious chronic diseases. Quitting smoking can be very challenging. Do not get discouraged if you are not successful the first time. Some people need to make many attempts to quit before they achieve long-term success. Do your best to stick to your quit plan, and talk with your health care provider if you have any questions or concerns. How do I get ready to  quit? When you decide to quit smoking, create a plan to help you succeed. Before you quit: Pick a date to quit. Set a date within the next 2 weeks to give you time to prepare. Write down the reasons why you are quitting. Keep this list in places where you will see it often. Tell your family, friends, and co-workers that you are quitting. Support from people you are close to can make quitting easier. Talk with your health care provider about your options for quitting smoking. Find out what treatment options are covered by your health insurance. Identify people, places, things, and activities that make you want to  smoke (triggers). Avoid them. What first steps can I take to quit smoking? Throw away all cigarettes at home, at work, and in your car. Throw away smoking accessories, such as Set designer. Clean your car. Make sure to empty the ashtray. Clean your home, including curtains and carpets. What strategies can I use to quit smoking? Talk with your health care provider about combining strategies, such as taking medicines while you are also receiving in-person counseling. Using these two strategies together makes you more likely to succeed in quitting than if you used either strategy on its own. If you are pregnant or breastfeeding, talk with your health care provider about finding counseling or other support strategies to quit smoking. Do not take medicine to help you quit smoking unless your health care provider tells you to. Quit right away Quit smoking completely, instead of gradually reducing how much you smoke over a period of time. Stopping smoking right away may be more successful than gradually quitting. Attend in-person counseling to help you build problem-solving skills. You are more likely to succeed in quitting if you attend counseling sessions regularly. Even short sessions of 10 minutes can be effective. Take medicine You may take medicines to help you quit smoking. Some medicines require a prescription. You can also purchase over-the-counter medicines. Medicines may have nicotine in them to replace the nicotine in cigarettes. Medicines may: Help to stop cravings. Help to relieve withdrawal symptoms. Your health care provider may recommend: Nicotine patches, gum, or lozenges. Nicotine inhalers or sprays. Non-nicotine medicine that you take by mouth. Find resources Find resources and support systems that can help you quit smoking and remain smoke-free after you quit. These resources are most helpful when you use them often. They include: Online chats with a Veterinary surgeon. Telephone  quitlines. Printed Materials engineer. Support groups or group counseling. Text messaging programs. Mobile phone apps or applications. Use apps that can help you stick to your quit plan by providing reminders, tips, and encouragement. Examples of free services include Quit Guide from the CDC and smokefree.gov  What can I do to make it easier to quit?  Reach out to your family and friends for support and encouragement. Call telephone quitlines, such as 1-800-QUIT-NOW, reach out to support groups, or work with a counselor for support. Ask people who smoke to avoid smoking around you. Avoid places that trigger you to smoke, such as bars, parties, or smoke-break areas at work. Spend time with people who do not smoke. Lessen the stress in your life. Stress can be a smoking trigger for some people. To lessen stress, try: Exercising regularly. Doing deep-breathing exercises. Doing yoga. Meditating. What benefits will I see if I quit smoking? Over time, you should start to see positive results, such as: Improved sense of smell and taste. Decreased coughing and sore throat. Slower heart rate. Lower blood  pressure. Clearer and healthier skin. The ability to breathe more easily. Fewer sick days. Summary Quitting smoking can be very challenging. Do not get discouraged if you are not successful the first time. Some people need to make many attempts to quit before they achieve long-term success. When you decide to quit smoking, create a plan to help you succeed. Quit smoking right away, not slowly over a period of time. Find resources and support systems that can help you quit smoking and remain smoke-free after you quit. This information is not intended to replace advice given to you by your health care provider. Make sure you discuss any questions you have with your health care provider. Document Revised: 08/21/2021 Document Reviewed: 08/21/2021 Elsevier Patient Education  2024 [employer].  Fall Prevention in the Home, Adult Falls can cause injuries and affect people of all ages. There are many simple things that you can do to make your home safe and to help prevent falls. If you need it, ask for help making these changes. What actions can I take to prevent falls? General information Use good lighting in all rooms. Make sure to: Replace any light bulbs that burn out. Turn on lights if it is dark and use night-lights. Keep items that you use often in easy-to-reach places. Lower the shelves around your home if needed. Move furniture so that there are clear paths around it. Do not keep throw rugs or other things on the floor that can make you trip. If any of your floors are uneven, fix them. Add color or contrast paint or tape to clearly mark and help you see: Grab bars or handrails. First and last steps of staircases. Where the edge of each step is. If you use a ladder or stepladder: Make sure that it is fully opened. Do not climb a closed ladder. Make sure the sides of the ladder are locked in place. Have someone hold the ladder while you use it. Know where your pets are as you move through your home. What can I do in the bathroom?     Keep the floor dry. Clean up any water that is on the floor right away. Remove soap buildup in the bathtub or shower. Buildup makes bathtubs and showers slippery. Use non-skid mats or decals on the floor of the bathtub or shower. Attach bath mats securely with double-sided, non-slip rug tape. If you need to sit down while you are in the shower, use a non-slip stool. Install grab bars by the toilet and in the bathtub and shower. Do not use towel bars as grab bars. What can I do in the bedroom? Make sure that you have a light by your bed that is easy to reach. Do not use any sheets or blankets on your bed that hang to the floor. Have a firm bench or chair with side arms that you can use for support when you get dressed. What can I  do in the kitchen? Clean up any spills right away. If you need to reach something above you, use a sturdy step stool that has a grab bar. Keep electrical cables out of the way. Do not use floor polish or wax that makes floors slippery. What can I do with my stairs? Do not leave anything on the stairs. Make sure that you have a light switch at the top and the bottom of the stairs. Have them installed if you do not have them. Make sure that there are handrails on both sides  of the stairs. Fix handrails that are broken or loose. Make sure that handrails are as long as the staircases. Install non-slip stair treads on all stairs in your home if they do not have carpet. Avoid having throw rugs at the top or bottom of stairs, or secure the rugs with carpet tape to prevent them from moving. Choose a carpet design that does not hide the edge of steps on the stairs. Make sure that carpet is firmly attached to the stairs. Fix any carpet that is loose or worn. What can I do on the outside of my home? Use bright outdoor lighting. Repair the edges of walkways and driveways and fix any cracks. Clear paths of anything that can make you trip, such as tools or rocks. Add color or contrast paint or tape to clearly mark and help you see high doorway thresholds. Trim any bushes or trees on the main path into your home. Check that handrails are securely fastened and in good repair. Both sides of all steps should have handrails. Install guardrails along the edges of any raised decks or porches. Have leaves, snow, and ice cleared regularly. Use sand, salt, or ice melt on walkways during winter months if you live where there is ice and snow. In the garage, clean up any spills right away, including grease or oil spills. What other actions can I take? Review your medicines with your health care provider. Some medicines can make you confused or feel dizzy. This can increase your chance of falling. Wear closed-toe shoes  that fit well and support your feet. Wear shoes that have rubber soles and low heels. Use a cane, walker, scooter, or crutches that help you move around if needed. Talk with your provider about other ways that you can decrease your risk of falls. This may include seeing a physical therapist to learn to do exercises to improve movement and strength. Where to find more information Centers for Disease Control and Prevention, STEADI: TonerPromos.no [employer] on Aging: BaseRingTones.pl National Institute on Aging: BaseRingTones.pl Contact a health care provider if: You are afraid of falling at home. You feel weak, drowsy, or dizzy at home. You fall at home. Get help right away if you: Lose consciousness or have trouble moving after a fall. Have a fall that causes a head injury. These symptoms may be an emergency. Get help right away. Call 911. Do not wait to see if the symptoms will go away. Do not drive yourself to the hospital. This information is not intended to replace advice given to you by your health care provider. Make sure you discuss any questions you have with your health care provider. Document Revised: 05/03/2022 Document Reviewed: 05/03/2022 Elsevier Patient Education  2024 [employer].

## 2024-02-08 LAB — HM DIABETES EYE EXAM

## 2024-02-14 ENCOUNTER — Other Ambulatory Visit: Payer: Self-pay | Admitting: Internal Medicine

## 2024-02-14 DIAGNOSIS — N390 Urinary tract infection, site not specified: Secondary | ICD-10-CM

## 2024-02-14 NOTE — Telephone Encounter (Signed)
 Requested Prescriptions  Refused Prescriptions Disp Refills   metFORMIN  (GLUCOPHAGE ) 1000 MG tablet [Pharmacy Med Name: metFORMIN  HCl 1000 MG Oral Tablet] 100 tablet 2    Sig: TAKE ONE-HALF TABLET BY MOUTH  TWICE DAILY WITH MEALS     Endocrinology:  Diabetes - Biguanides Failed - 02/14/2024  5:46 PM      Failed - HBA1C is between 0 and 7.9 and within 180 days    Hemoglobin A1C  Date Value Ref Range Status  05/04/2022 7.0  Final   Hgb A1c MFr Bld  Date Value Ref Range Status  01/24/2024 8.6 (H) 4.8 - 5.6 % Final    Comment:             Prediabetes: 5.7 - 6.4          Diabetes: >6.4          Glycemic control for adults with diabetes: <7.0          Failed - B12 Level in normal range and within 720 days    No results found for: "VITAMINB12"       Failed - CBC within normal limits and completed in the last 12 months    WBC  Date Value Ref Range Status  09/21/2022 11.1 (H) 3.4 - 10.8 x10E3/uL Final  05/19/2022 13.4 (H) 4.0 - 10.5 K/uL Final   RBC  Date Value Ref Range Status  09/21/2022 4.96 3.77 - 5.28 x10E6/uL Final  05/19/2022 5.01 3.87 - 5.11 MIL/uL Final   Hemoglobin  Date Value Ref Range Status  09/21/2022 15.4 11.1 - 15.9 g/dL Final   Hematocrit  Date Value Ref Range Status  09/21/2022 46.3 34.0 - 46.6 % Final   MCHC  Date Value Ref Range Status  09/21/2022 33.3 31.5 - 35.7 g/dL Final  16/06/9603 54.0 30.0 - 36.0 g/dL Final   [facility]  Date Value Ref Range Status  09/21/2022 31.0 26.6 - 33.0 pg Final  05/19/2022 31.5 26.0 - 34.0 pg Final   MCV  Date Value Ref Range Status  09/21/2022 93 79 - 97 fL Final   No results found for: "PLTCOUNTKUC", "LABPLAT", "POCPLA" RDW  Date Value Ref Range Status  09/21/2022 11.6 (L) 11.7 - 15.4 % Final         Passed - Cr in normal range and within 360 days    Creatinine, Ser  Date Value Ref Range Status  05/25/2023 0.88 0.57 - 1.00 mg/dL Final   Creatinine, Urine  Date Value Ref Range Status  01/01/2022 67  Final          Passed - eGFR in normal range and within 360 days    GFR calc Af Amer  Date Value Ref Range Status  12/02/2015 >60 >60 mL/min Final    Comment:    (NOTE) The eGFR has been calculated using the CKD EPI equation. This calculation has not been validated in all clinical situations. eGFR's persistently <60 mL/min signify possible Chronic Kidney Disease.    GFR, Estimated  Date Value Ref Range Status  05/19/2022 >60 >60 mL/min Final    Comment:    (NOTE) Calculated using the CKD-EPI Creatinine Equation (2021)    eGFR  Date Value Ref Range Status  05/25/2023 69 >59 mL/min/1.73 Final         Passed - Valid encounter within last 6 months    Recent Outpatient Visits           3 weeks ago Type II diabetes mellitus with complication (HCC)  [facility] at [facility], Chales [place], MD       Future Appointments             In 3 months Gala Jubilee, Chales [place], MD [facility] Primary Care & Sports Medicine at [facility], [facility]

## 2024-02-19 ENCOUNTER — Encounter: Payer: Self-pay | Admitting: Emergency Medicine

## 2024-02-19 ENCOUNTER — Ambulatory Visit
Admission: EM | Admit: 2024-02-19 | Discharge: 2024-02-19 | Disposition: A | Discharge status: Home / Self Care | Attending: Physician Assistant | Admitting: Physician Assistant

## 2024-02-19 DIAGNOSIS — N390 Urinary tract infection, site not specified: Secondary | ICD-10-CM | POA: Insufficient documentation

## 2024-02-19 DIAGNOSIS — R300 Dysuria: Secondary | ICD-10-CM | POA: Insufficient documentation

## 2024-02-19 DIAGNOSIS — B3731 Acute candidiasis of vulva and vagina: Secondary | ICD-10-CM | POA: Insufficient documentation

## 2024-02-19 DIAGNOSIS — R110 Nausea: Secondary | ICD-10-CM | POA: Diagnosis not present

## 2024-02-19 LAB — URINALYSIS, W/ REFLEX TO CULTURE (INFECTION SUSPECTED)
Bilirubin Urine: NEGATIVE
Glucose, UA: 500 mg/dL — AB
Ketones, ur: NEGATIVE mg/dL
Nitrite: POSITIVE — AB
Protein, ur: 30 mg/dL — AB
Specific Gravity, Urine: <1.005 — ABNORMAL LOW (ref 1.005–1.030)
pH: 5.5 (ref 5.0–8.0)

## 2024-02-19 MED ORDER — FLUCONAZOLE 150 MG PO TABS
ORAL_TABLET | ORAL | 0 refills | Status: DC
Start: 2024-02-19 — End: 2024-05-21

## 2024-02-19 MED ORDER — PHENAZOPYRIDINE HCL 200 MG PO TABS: 200.0000 mg | ORAL_TABLET | Freq: Three times a day (TID) | ORAL | 0 refills | Status: DC

## 2024-02-19 MED ORDER — ONDANSETRON 4 MG PO TBDP: 4.0000 mg | ORAL_TABLET | Freq: Three times a day (TID) | ORAL | 0 refills | Status: DC | PRN

## 2024-02-19 MED ORDER — CEPHALEXIN 500 MG PO CAPS: 500.0000 mg | ORAL_CAPSULE | Freq: Three times a day (TID) | ORAL | 0 refills | Status: AC

## 2024-02-19 NOTE — Discharge Instructions (Signed)

## 2024-02-19 NOTE — ED Triage Notes (Signed)
 Patient c/o dysuria and abdominal pain that started 3 days.  Patient denies fevers.

## 2024-02-19 NOTE — ED Provider Notes (Signed)
 [facility]    CSN: [id number] Arrival date & time: 02/19/24  1302      History   Chief Complaint Chief Complaint  Patient presents with   Dysuria   Abdominal Pain    HPI Samantha Welch is a 73 y.o. female presenting for dysuria, frequency, urgency, lower back pain, lower abdominal pain and nausea for the past 3 days.  Patient denies fever, hematuria, vaginal discharge.  History of recurrent UTIs and recurrent yeast infections.  Patient took Bactrim  DS x 5 days starting 01/29/24 for UTI.  Patient says this did improve symptoms but she does not think the course was long enough.  She does follow-up with a urologist regarding recurrent infections.  She says they are not really sure why she is having the recurrent infections.  HPI  Past Medical History:  Diagnosis Date   Anxiety    Arthritis    Cancer (HCC)    skin ca on face   Chronic kidney disease    Left kidney stone   COPD (chronic obstructive pulmonary disease) (HCC)    Depression    Diabetes mellitus without complication (HCC)    GERD (gastroesophageal reflux disease)    Hyperlipemia    Stomach ulcer    Tobacco abuse disorder     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   Recurrent UTI 02/10/2016   Major depression single episode, in partial remission (HCC) 03/31/2015   Hyperlipidemia associated with type 2 diabetes mellitus (HCC) 07/25/2014   Type II diabetes mellitus with complication (HCC) 07/25/2014   GERD (gastroesophageal reflux disease) 04/23/2011    Past Surgical History:  Procedure Laterality Date   BREAST BIOPSY Right 08/08/2023   rt br calcs, ribbon marker, path pending   BREAST BIOPSY Right 08/08/2023   MM RT BREAST BX W LOC DEV 1ST LESION IMAGE BX SPEC STEREO GUIDE 08/08/2023 [facility]   COLONOSCOPY WITH PROPOFOL   N/A 10/05/2021   Procedure: COLONOSCOPY WITH PROPOFOL ;  Surgeon: Quintin Buckle, DO;  Location: [facility];  Service: Gastroenterology;  Laterality: N/A;   CYSTOSCOPY W/ URETERAL STENT PLACEMENT Left 12/15/2015   Procedure: CYSTOSCOPY WITH STENT REPLACEMENT;  Surgeon: Dustin Gimenez, MD;  Location: [facility];  Service: Urology;  Laterality: Left;   CYSTOSCOPY WITH STENT PLACEMENT Left 11/16/2015   Procedure: CYSTOSCOPY WITH LEFT STENT PLACEMENT, LEFT RETROGRADE;  Surgeon: Rodell Citrin, MD;  Location: [facility];  Service: Urology;  Laterality: Left;   FOOT SURGERY Bilateral    KNEE ARTHROSCOPY WITH MEDIAL MENISECTOMY Left 03/20/2020   Procedure: knee arthroscopic partial medial AND lateral meniscectomy;  Surgeon: Lorri Rota, MD;  Location: [facility];  Service: Orthopedics;  Laterality: Left;  Diabetic - oral meds   TUBAL LIGATION     URETEROSCOPY WITH HOLMIUM LASER LITHOTRIPSY Left 12/15/2015   Procedure: URETEROSCOPY WITH HOLMIUM LASER LITHOTRIPSY;  Surgeon: Dustin Gimenez, MD;  Location: [facility];  Service: Urology;  Laterality: Left;    OB History     Gravida  2   Para  2   Term  2   Preterm  0   AB  0   Living  0      SAB  0   IAB  0   Ectopic  0   Multiple  0   Live Births  Home Medications    Prior to Admission medications   Medication Sig Start Date End Date Taking? Authorizing Provider  cephALEXin  (KEFLEX ) 500 MG capsule Take 1 capsule (500 mg total) by mouth 3 (three) times daily for 7 days. 02/19/24 02/26/24 Yes Floydene Hy, PA-C  fluconazole  (DIFLUCAN ) 150 MG tablet Take 1 tab po q72 hr prn yeast infection 02/19/24  Yes Nancy Axon B, PA-C  ondansetron  (ZOFRAN -ODT) 4 MG disintegrating tablet Take 1 tablet (4 mg total) by mouth every 8 (eight) hours as needed. 02/19/24  Yes Floydene Hy, PA-C  phenazopyridine  (PYRIDIUM ) 200 MG tablet Take 1 tablet (200 mg total) by mouth 3 (three) times daily. 02/19/24  Yes  Floydene Hy, PA-C  aspirin  81 MG chewable tablet Chew 81 mg by mouth daily.    [provider]  celecoxib (CELEBREX) 200 MG capsule Take 200 mg by mouth 2 (two) times daily. 05/09/23   [provider]  clonazePAM  (KLONOPIN ) 1 MG tablet Take 1 mg by mouth 2 (two) times daily.    [provider]  conjugated estrogens  (PREMARIN ) vaginal cream Place 1 Applicatorful vaginally daily. Apply 0.5mg  (pea-sized amount)  just inside the vaginal introitus with a finger-tip on  Monday, Wednesday and Friday nights. 10/31/23   Matilde Son A, PA-C  dicyclomine (BENTYL) 10 MG capsule Take 10 mg by mouth 4 (four) times daily -  before meals and at bedtime.    [provider]  FARXIGA  5 MG TABS tablet TAKE 1 TABLET BY MOUTH DAILY  BEFORE BREAKFAST 01/03/24   [name], MD  FLUoxetine (PROZAC) 20 MG capsule Take 20 mg by mouth daily. 10/18/23   [provider]  gabapentin (NEURONTIN) 300 MG capsule Take 300 mg by mouth at bedtime as needed. 05/06/23   [provider]  glucose blood test strip     [provider]  meclizine  (ANTIVERT ) 12.5 MG tablet Take 1 tablet (12.5 mg total) by mouth 3 (three) times daily as needed for dizziness. 05/25/23   [name], MD  metFORMIN  (GLUCOPHAGE ) 1000 MG tablet Take one tablet in the AM and 1/2 tablet in the PM. 01/29/24   [name], MD  nystatin  ointment (MYCOSTATIN ) Apply 1 Application topically 2 (two) times daily. 01/08/24   Floydene Hy, PA-C  ondansetron  (ZOFRAN ) 4 MG tablet Take 1 tablet (4 mg total) by mouth every 8 (eight) hours as needed for nausea or vomiting. 10/25/23   [name], MD  pantoprazole  (PROTONIX ) 40 MG tablet TAKE 1 TABLET BY MOUTH AT  BEDTIME 07/14/23   [name], MD  pravastatin  (PRAVACHOL ) 20 MG tablet TAKE 1 TABLET BY MOUTH AT  BEDTIME 01/02/24   Berglund, Laura H, MD  traZODone (DESYREL) 50 MG tablet Take 50-100 mg by mouth at bedtime.    [provider]  TRULANCE  3 MG TABS TAKE 1 TABLET BY MOUTH ONCE DAILY AT  6AM 09/29/23   [name], MD    Family History Family History  Problem Relation Age of Onset   Heart disease Mother    Diabetes Mother    Emphysema Father    Colon cancer Brother    Bladder Cancer Neg Hx    Kidney cancer Neg Hx    Prostate cancer Neg Hx    Breast cancer Neg Hx     Social History Social History   Tobacco Use   Smoking status: Every Day    Current packs/day: 0.50  Average packs/day: 0.5 packs/day for 45.4 years (22.7 ttl pk-yrs)    Types: Cigarettes    Start date: 1980    Passive exposure: Current   Smokeless tobacco: Never  Vaping Use   Vaping status: Never Used  Substance Use Topics   Alcohol use: No    Alcohol/week: 0.0 standard drinks of alcohol   Drug use: No     Allergies   Venlafaxine    Review of Systems Review of Systems  Constitutional:  Negative for chills, fatigue and fever.  Gastrointestinal:  Positive for abdominal pain and nausea. Negative for diarrhea and vomiting.  Genitourinary:  Positive for dysuria, frequency and urgency. Negative for decreased urine volume, flank pain, hematuria, pelvic pain, vaginal bleeding, vaginal discharge and vaginal pain.  Musculoskeletal:  Positive for back pain.  Skin:  Negative for rash.     Physical Exam Triage Vital Signs ED Triage Vitals  Encounter Vitals Group     BP 02/19/24 1319 (!) 147/92     Systolic BP Percentile --      Diastolic BP Percentile --      Pulse Rate 02/19/24 1319 74     Resp 02/19/24 1319 15     Temp 02/19/24 1319 98.2 F (36.8 C)     Temp Source 02/19/24 1319 Oral     SpO2 02/19/24 1319 96 %     Weight 02/19/24 1317 143 lb 1.3 oz (64.9 kg)     Height 02/19/24 1317 5\' 2"  (1.575 m)     Head Circumference --      Peak Flow --      Pain Score 02/19/24 1317 8     Pain Loc --      Pain Education --      Exclude from Growth Chart --    No data found.  Updated Vital Signs BP (!)  147/92 (BP Location: Left Arm)   Pulse 74   Temp 98.2 F (36.8 C) (Oral)   Resp 15   Ht 5\' 2"  (1.575 m)   Wt 143 lb 1.3 oz (64.9 kg)   SpO2 96%   BMI 26.17 kg/m     Physical Exam Vitals and nursing note reviewed.  Constitutional:      General: She is not in acute distress.    Appearance: Normal appearance. She is not ill-appearing or toxic-appearing.     Comments: Patient is tearful  HENT:     Head: Normocephalic and atraumatic.  Eyes:     General: No scleral icterus.       Right eye: No discharge.        Left eye: No discharge.     Conjunctiva/sclera: Conjunctivae normal.  Cardiovascular:     Rate and Rhythm: Normal rate and regular rhythm.     Heart sounds: Normal heart sounds.  Pulmonary:     Effort: Pulmonary effort is normal. No respiratory distress.     Breath sounds: Normal breath sounds.  Abdominal:     Palpations: Abdomen is soft.     Tenderness: There is abdominal tenderness in the suprapubic area. There is no guarding or rebound.  Musculoskeletal:     Cervical back: Neck supple.  Skin:    General: Skin is dry.  Neurological:     General: No focal deficit present.     Mental Status: She is alert. Mental status is at baseline.     Motor: No weakness.     Gait: Gait normal.      UC Treatments / Results  Labs (all labs ordered are listed, but only abnormal results are displayed) Labs Reviewed  URINALYSIS, W/ REFLEX TO CULTURE (INFECTION SUSPECTED) - Abnormal; Notable for the following components:      Result Value   Color, Urine STRAW (*)    APPearance HAZY (*)    Specific Gravity, Urine <1.005 (*)    Glucose, UA 500 (*)    Hgb urine dipstick SMALL (*)    Protein, ur 30 (*)    Nitrite POSITIVE (*)    Leukocytes,Ua SMALL (*)    Bacteria, UA MANY (*)    All other components within normal limits  URINE CULTURE    EKG   Radiology No results found.  Procedures Procedures (including critical care time)  Medications Ordered in UC Medications  - No data to display  Initial Impression / Assessment and Plan / UC Course  I have reviewed the triage vital signs and the nursing notes.  Pertinent labs & imaging results that were available during my care of the patient were reviewed by me and considered in my medical decision making (see chart for details).   73 year old female with history recurrent UTIs and recurrent yeast infections presents for dysuria, frequency and urgency, lower abdominal pain and lower back pain for the past 3 days.  Seen by PCP on 5/18 and prescribed 5-day course of Bactrim  DS which did help symptoms somewhat.  Chart review performed at previous visit to PCP clinic on 5/18 and also reviewed urology notes from February 2017.  Reviewed most recent culture which was positive for Enterobacter cloaca.  That showed susceptibility to everything except Macrobid .  UA today shows hazy urine with glucose, small hemoglobin, positive nitrites, small leukocytes, many bacteria, clumps of white blood cells and yeast.  Urine will be sent for culture.  Recurrent UTI.  Treating at this time with Keflex  500 mg 3 times daily x 7 days and Diflucan  for yeast infection.  Also sent Pyridium  and Zofran .  Encouraged increasing rest and fluids.  To follow-up with PCP and urology.   Final Clinical Impressions(s) / UC Diagnoses   Final diagnoses:  Recurrent UTI  Dysuria  Nausea without vomiting  Vaginal yeast infection     Discharge Instructions      UTI: Based on either symptoms or urinalysis, you may have a urinary tract infection. We will send the urine for culture and call with results in a few days. Begin antibiotics at this time. Your symptoms should be much improved over the next 2-3 days. Increase rest and fluid intake. If for some reason symptoms are worsening or not improving after a couple of days or the urine culture determines the antibiotics you are taking will not treat the infection, the antibiotics may be changed. Return  or go to ER for fever, back pain, worsening urinary pain, discharge, increased blood in urine. May take Tylenol  or Motrin  OTC for pain relief or consider AZO if no contraindications    ED Prescriptions     Medication Sig Dispense Auth. Provider   cephALEXin  (KEFLEX ) 500 MG capsule Take 1 capsule (500 mg total) by mouth 3 (three) times daily for 7 days. 21 capsule Nancy Axon B, PA-C   fluconazole  (DIFLUCAN ) 150 MG tablet Take 1 tab po q72 hr prn yeast infection 3 tablet Nancy Axon B, PA-C   phenazopyridine  (PYRIDIUM ) 200 MG tablet Take 1 tablet (200 mg total) by mouth 3 (three) times daily. 6 tablet Nancy Axon B, PA-C   ondansetron  (ZOFRAN -ODT) 4 MG disintegrating tablet  Take 1 tablet (4 mg total) by mouth every 8 (eight) hours as needed. 15 tablet [place] Arch B, PA-C      PDMP not reviewed this encounter.   Floydene Hy, PA-C 02/19/24 1401

## 2024-02-21 ENCOUNTER — Ambulatory Visit (HOSPITAL_BASED_OUTPATIENT_CLINIC_OR_DEPARTMENT_OTHER): Payer: Self-pay

## 2024-02-21 LAB — URINE CULTURE: Culture: >=100000 — AB

## 2024-02-22 MED ORDER — SULFAMETHOXAZOLE-TRIMETHOPRIM 800-160 MG PO TABS: 1.0000 | ORAL_TABLET | Freq: Two times a day (BID) | ORAL | 0 refills | Status: AC

## 2024-02-29 ENCOUNTER — Other Ambulatory Visit: Payer: Self-pay | Admitting: Internal Medicine

## 2024-03-02 NOTE — Telephone Encounter (Signed)
 Requested medications are due for refill today.  yes  Requested medications are on the active medications list.  yes  Last refill. 02/19/2024 #15 0 rf  Future visit scheduled.   yes  Notes to clinic.  Refill not delegated.    Requested Prescriptions  Pending Prescriptions Disp Refills   ondansetron  (ZOFRAN ) 4 MG tablet [Pharmacy Med Name: ONDANSETRON   4MG   TAB] 60 tablet 0    Sig: TAKE 1 TABLET BY MOUTH EVERY 8  HOURS AS NEEDED FOR NAUSEA AND  VOMITING     Not Delegated - Gastroenterology: Antiemetics - ondansetron  Failed - 03/02/2024  3:02 PM      Failed - This refill cannot be delegated      Passed - AST in normal range and within 360 days    AST  Date Value Ref Range Status  05/25/2023 15 0 - 40 IU/L Final         Passed - ALT in normal range and within 360 days    ALT  Date Value Ref Range Status  05/25/2023 17 0 - 32 IU/L Final         Passed - Valid encounter within last 6 months    Recent Outpatient Visits           1 month ago Type II diabetes mellitus with complication [facility])   [place] [facility] at [facility], Chales [place], MD       Future Appointments             In 2 months Gala Jubilee, Chales [place], MD [facility] at [facility], [facility]

## 2024-04-14 ENCOUNTER — Other Ambulatory Visit: Payer: Self-pay | Admitting: Urology

## 2024-04-14 DIAGNOSIS — N390 Urinary tract infection, site not specified: Secondary | ICD-10-CM

## 2024-05-08 ENCOUNTER — Other Ambulatory Visit: Payer: Self-pay | Admitting: Internal Medicine

## 2024-05-08 DIAGNOSIS — E118 Type 2 diabetes mellitus with unspecified complications: Secondary | ICD-10-CM

## 2024-05-09 NOTE — Telephone Encounter (Signed)
 Requested Prescriptions  Pending Prescriptions Disp Refills   FARXIGA  5 MG TABS tablet [Pharmacy Med Name: Farxiga  5 MG Oral Tablet] 100 tablet 0    Sig: TAKE 1 TABLET BY MOUTH DAILY  BEFORE BREAKFAST     Endocrinology:  Diabetes - SGLT2 Inhibitors Failed - 05/09/2024 11:07 AM      Failed - HBA1C is between 0 and 7.9 and within 180 days    Hemoglobin A1C  Date Value Ref Range Status  05/04/2022 7.0  Final   Hgb A1c MFr Bld  Date Value Ref Range Status  01/24/2024 8.6 (H) 4.8 - 5.6 % Final    Comment:             Prediabetes: 5.7 - 6.4          Diabetes: >6.4          Glycemic control for adults with diabetes: <7.0          Passed - Cr in normal range and within 360 days    Creatinine, Ser  Date Value Ref Range Status  05/25/2023 0.88 0.57 - 1.00 mg/dL Final   Creatinine, Urine  Date Value Ref Range Status  01/01/2022 67  Final         Passed - eGFR in normal range and within 360 days    GFR calc Af Amer  Date Value Ref Range Status  12/02/2015 >60 >60 mL/min Final    Comment:    (NOTE) The eGFR has been calculated using the CKD EPI equation. This calculation has not been validated in all clinical situations. eGFR's persistently <60 mL/min signify possible Chronic Kidney Disease.    GFR, Estimated  Date Value Ref Range Status  05/19/2022 >60 >60 mL/min Final    Comment:    (NOTE) Calculated using the CKD-EPI Creatinine Equation (2021)    eGFR  Date Value Ref Range Status  05/25/2023 69 >59 mL/min/1.73 Final         Passed - Valid encounter within last 6 months    Recent Outpatient Visits           3 months ago Type II diabetes mellitus with complication [facility])   [place] [facility] at [facility], Leita DEL, MD       Future Appointments             In 2 weeks Justus, Leita DEL, MD [facility] at [facility], [address]   In 1 month McGowan, Clotilda DELENA RIGGERS [facility]  [place]

## 2024-05-17 ENCOUNTER — Other Ambulatory Visit: Payer: Self-pay | Admitting: Internal Medicine

## 2024-05-17 DIAGNOSIS — K589 Irritable bowel syndrome without diarrhea: Secondary | ICD-10-CM

## 2024-05-18 NOTE — Telephone Encounter (Signed)
 Requested Prescriptions  Pending Prescriptions Disp Refills   pantoprazole  (PROTONIX ) 40 MG tablet [Pharmacy Med Name: Pantoprazole  Sodium 40 MG Oral Tablet Delayed Release] 100 tablet 0    Sig: TAKE 1 TABLET BY MOUTH AT  BEDTIME     Gastroenterology: Proton Pump Inhibitors Passed - 05/18/2024  9:16 AM      Passed - Valid encounter within last 12 months    Recent Outpatient Visits           3 months ago Type II diabetes mellitus with complication [facility])   [place] [facility] at [facility], Leita DEL, MD       Future Appointments             In 1 week Justus, Leita DEL, MD [facility] & [facility] Medicine at [facility], [address]   In 3 weeks McGowan, Clotilda DELENA RIGGERS [facility] [place]

## 2024-05-21 ENCOUNTER — Ambulatory Visit
Admission: EM | Admit: 2024-05-21 | Discharge: 2024-05-21 | Disposition: A | Discharge status: Home / Self Care | Attending: Emergency Medicine | Admitting: Emergency Medicine

## 2024-05-21 ENCOUNTER — Encounter: Payer: Self-pay | Admitting: Emergency Medicine

## 2024-05-21 ENCOUNTER — Ambulatory Visit: Payer: Self-pay | Admitting: Emergency Medicine

## 2024-05-21 DIAGNOSIS — N390 Urinary tract infection, site not specified: Secondary | ICD-10-CM | POA: Diagnosis not present

## 2024-05-21 LAB — BASIC METABOLIC PANEL WITH GFR
Anion gap: 12 (ref 5–15)
BUN: 19 mg/dL (ref 8–23)
CO2: 25 mmol/L (ref 22–32)
Calcium: 10 mg/dL (ref 8.9–10.3)
Chloride: 95 mmol/L — ABNORMAL LOW (ref 98–111)
Creatinine, Ser: 1.02 mg/dL — ABNORMAL HIGH (ref 0.44–1.00)
GFR, Estimated: 57 mL/min — ABNORMAL LOW (ref >60)
Glucose, Bld: 250 mg/dL — ABNORMAL HIGH (ref 70–99)
Potassium: 4.3 mmol/L (ref 3.5–5.1)
Sodium: 132 mmol/L — ABNORMAL LOW (ref 135–145)

## 2024-05-21 LAB — URINALYSIS, W/ REFLEX TO CULTURE (INFECTION SUSPECTED)
Bilirubin Urine: NEGATIVE
Glucose, UA: >=500 mg/dL — AB
Ketones, ur: NEGATIVE mg/dL
Nitrite: POSITIVE — AB
Specific Gravity, Urine: 1.015 (ref 1.005–1.030)
WBC, UA: >50 WBC/hpf (ref 0–5)
pH: 5.5 (ref 5.0–8.0)

## 2024-05-21 MED ORDER — ONDANSETRON 4 MG PO TBDP
4.0000 mg | ORAL_TABLET | Freq: Once | ORAL | Status: AC
  Administered 2024-05-21: 4 mg via ORAL

## 2024-05-21 MED ORDER — CEFTRIAXONE SODIUM 1 G IJ SOLR
1.0000 g | Freq: Once | INTRAMUSCULAR | Status: AC
  Administered 2024-05-21: 1 g via INTRAMUSCULAR

## 2024-05-21 MED ORDER — ONDANSETRON 4 MG PO TBDP: 4.0000 mg | ORAL_TABLET | Freq: Three times a day (TID) | ORAL | 0 refills | Status: DC | PRN

## 2024-05-21 MED ORDER — PHENAZOPYRIDINE HCL 200 MG PO TABS: 200.0000 mg | ORAL_TABLET | Freq: Three times a day (TID) | ORAL | 0 refills | Status: DC | PRN

## 2024-05-21 MED ORDER — CEFDINIR 300 MG PO CAPS: 300.0000 mg | ORAL_CAPSULE | Freq: Two times a day (BID) | ORAL | 0 refills | Status: DC

## 2024-05-21 NOTE — ED Provider Notes (Signed)
 HPI  SUBJECTIVE:  Samantha Welch is a 73 y.o. female who presents with urethral pain, several days of nonradiating, nonmigratory left back pain that has not moved and intermittent, hours along with pelvic pain starting last night.  She reports dysuria, nausea.  No urinary urgency, frequency, cloudy odorous urine, hematuria, vomiting, fevers, vaginal odor, bleeding, discharge, itching.  She is not sexually active.  No antipyretic in the past 6 hours.  She has tried an unknown vaginal cream and unknown UTI prevention pill and an unknown pain reliever from [employer] without improvement in her symptoms.  No aggravating or alleviating factors.  She has a past medical history of nephrolithiasis, recurrent UTI, yeast vaginitis COPD, diabetes, GERD, hyperlipidemia, PUD, IBS.  She is followed by urology for recurrent UTI and has an appointment with them at the end of the month.  No history of chronic kidney disease, hypertension.  PCP: [facility]-has follow-up appoint with them on 9/16.  Urology: [facility].  She was seen here back in June for UTI, sent home on Keflex .  Last urine culture on 6/ 8/25 on grew out Citrobacter which was sensitive to third-generation cephalosporin, Cipro , Macrobid , Bactrim .   Past Medical History:  Diagnosis Date   Anxiety    Arthritis    Cancer (HCC)    skin ca on face   Chronic kidney disease    Left kidney stone   COPD (chronic obstructive pulmonary disease) (HCC)    Depression    Diabetes mellitus without complication (HCC)    GERD (gastroesophageal reflux disease)    Hyperlipemia    Stomach ulcer    Tobacco abuse disorder     Past Surgical History:  Procedure Laterality Date   BREAST BIOPSY Right 08/08/2023   rt br calcs, ribbon marker, path pending   BREAST BIOPSY Right 08/08/2023   MM RT BREAST BX W LOC DEV 1ST LESION IMAGE BX SPEC STEREO GUIDE 08/08/2023 [facility]   COLONOSCOPY WITH PROPOFOL  N/A 10/05/2021   Procedure: COLONOSCOPY WITH PROPOFOL ;   Surgeon: Onita Elspeth Sharper, DO;  Location: [facility];  Service: Gastroenterology;  Laterality: N/A;   CYSTOSCOPY W/ URETERAL STENT PLACEMENT Left 12/15/2015   Procedure: CYSTOSCOPY WITH STENT REPLACEMENT;  Surgeon: Rosina Riis, MD;  Location: [facility];  Service: Urology;  Laterality: Left;   CYSTOSCOPY WITH STENT PLACEMENT Left 11/16/2015   Procedure: CYSTOSCOPY WITH LEFT STENT PLACEMENT, LEFT RETROGRADE;  Surgeon: Zina JINNY Irwin, MD;  Location: [facility];  Service: Urology;  Laterality: Left;   FOOT SURGERY Bilateral    KNEE ARTHROSCOPY WITH MEDIAL MENISECTOMY Left 03/20/2020   Procedure: knee arthroscopic partial medial AND lateral meniscectomy;  Surgeon: Tobie Priest, MD;  Location: [facility];  Service: Orthopedics;  Laterality: Left;  Diabetic - oral meds   TUBAL LIGATION     URETEROSCOPY WITH HOLMIUM LASER LITHOTRIPSY Left 12/15/2015   Procedure: URETEROSCOPY WITH HOLMIUM LASER LITHOTRIPSY;  Surgeon: Rosina Riis, MD;  Location: [facility];  Service: Urology;  Laterality: Left;    Family History  Problem Relation Age of Onset   Heart disease Mother    Diabetes Mother    Emphysema Father    Colon cancer Brother    Bladder Cancer Neg Hx    Kidney cancer Neg Hx    Prostate cancer Neg Hx    Breast cancer Neg Hx     Social History   Tobacco Use   Smoking status: Every Day    Current packs/day: 0.50    Average packs/day: 0.5 packs/day for  45.7 years (22.8 ttl pk-yrs)    Types: Cigarettes    Start date: 30    Passive exposure: Current   Smokeless tobacco: Never  Vaping Use   Vaping status: Never Used  Substance Use Topics   Alcohol use: No    Alcohol/week: 0.0 standard drinks of alcohol   Drug use: No    No current facility-administered medications for this encounter.  Current Outpatient Medications:    cefdinir  (OMNICEF ) 300 MG capsule, Take 1 capsule (300 mg total) by mouth 2 (two) times daily., Disp: 20 capsule, Rfl: 0   ondansetron   (ZOFRAN -ODT) 4 MG disintegrating tablet, Take 1 tablet (4 mg total) by mouth every 8 (eight) hours as needed for nausea or vomiting., Disp: 20 tablet, Rfl: 0   phenazopyridine  (PYRIDIUM ) 200 MG tablet, Take 1 tablet (200 mg total) by mouth 3 (three) times daily as needed for pain., Disp: 6 tablet, Rfl: 0   aspirin  81 MG chewable tablet, Chew 81 mg by mouth daily., Disp: , Rfl:    clonazePAM  (KLONOPIN ) 1 MG tablet, Take 1 mg by mouth 2 (two) times daily., Disp: , Rfl:    conjugated estrogens  (PREMARIN ) vaginal cream, Place 1 Applicatorful vaginally daily. Apply 0.5mg  (pea-sized amount)  just inside the vaginal introitus with a finger-tip on  Monday, Wednesday and Friday nights., Disp: 30 g, Rfl: 12   dicyclomine (BENTYL) 10 MG capsule, Take 10 mg by mouth 4 (four) times daily -  before meals and at bedtime., Disp: , Rfl:    FARXIGA  5 MG TABS tablet, TAKE 1 TABLET BY MOUTH DAILY  BEFORE BREAKFAST, Disp: 100 tablet, Rfl: 0   FLUoxetine (PROZAC) 20 MG capsule, Take 20 mg by mouth daily., Disp: , Rfl:    gabapentin (NEURONTIN) 300 MG capsule, Take 300 mg by mouth at bedtime as needed., Disp: , Rfl:    glucose blood test strip, , Disp: , Rfl:    meclizine  (ANTIVERT ) 12.5 MG tablet, Take 1 tablet (12.5 mg total) by mouth 3 (three) times daily as needed for dizziness., Disp: 30 tablet, Rfl: 0   metFORMIN  (GLUCOPHAGE ) 1000 MG tablet, Take one tablet in the AM and 1/2 tablet in the PM., Disp: 135 tablet, Rfl: 0   nystatin  ointment (MYCOSTATIN ), Apply 1 Application topically 2 (two) times daily., Disp: 30 g, Rfl: 0   pantoprazole  (PROTONIX ) 40 MG tablet, TAKE 1 TABLET BY MOUTH AT  BEDTIME, Disp: 100 tablet, Rfl: 0   pravastatin  (PRAVACHOL ) 20 MG tablet, TAKE 1 TABLET BY MOUTH AT  BEDTIME, Disp: 100 tablet, Rfl: 1   traZODone (DESYREL) 50 MG tablet, Take 50-100 mg by mouth at bedtime., Disp: , Rfl:    TRULANCE  3 MG TABS, TAKE 1 TABLET BY MOUTH ONCE DAILY AT  6AM, Disp: 90 tablet, Rfl: 0  Allergies  Allergen  Reactions   Venlafaxine  Nausea Only     ROS  As noted in HPI.   Physical Exam  BP (!) 158/70 (BP Location: Right Arm)   Pulse 68   Temp 98.3 F (36.8 C) (Oral)   Resp 16   Wt 65.8 kg   SpO2 98%   BMI 26.52 kg/m   Constitutional: Well developed, well nourished, no acute distress Eyes: PERRL, EOMI, conjunctiva normal bilaterally HENT: Normocephalic, atraumatic,mucus membranes moist Respiratory: Clear to auscultation bilaterally, no rales, no wheezing, no rhonchi Cardiovascular: Normal rate and rhythm, no murmurs, no gallops, no rubs GI: Soft, nondistended, normal bowel sounds, positive suprapubic, bilateral flank tenderness, no rebound, no guarding Back: Left  CVAT skin: No rash, skin intact Musculoskeletal: No edema, no tenderness, no deformities Neurologic: Alert & oriented x 3, CN III-XII grossly intact, no motor deficits, sensation grossly intact Psychiatric: Speech and behavior appropriate   ED Course   Medications  cefTRIAXone  (ROCEPHIN ) injection 1 g (1 g Intramuscular Given 05/21/24 1444)  ondansetron  (ZOFRAN -ODT) disintegrating tablet 4 mg (4 mg Oral Given 05/21/24 1444)    Orders Placed This Encounter  Procedures   Urine Culture    Standing Status:   Standing    Number of Occurrences:   1   Urinalysis, w/ Reflex to Culture (Infection Suspected) -Urine, Clean Catch    Standing Status:   Standing    Number of Occurrences:   1    Specimen Source:   Urine, Clean Catch [76]   Basic metabolic panel    Standing Status:   Standing    Number of Occurrences:   1   Results for orders placed or performed during the hospital encounter of 05/21/24 (from the past 24 hours)  Urinalysis, w/ Reflex to Culture (Infection Suspected) -Urine, Clean Catch     Status: Abnormal   Collection Time: 05/21/24  1:58 PM  Result Value Ref Range   Specimen Source URINE, CLEAN CATCH    Color, Urine YELLOW YELLOW   APPearance CLOUDY (A) CLEAR   Specific Gravity, Urine 1.015 1.005 -  1.030   pH 5.5 5.0 - 8.0   Glucose, UA >=500 (A) NEGATIVE mg/dL   Hgb urine dipstick SMALL (A) NEGATIVE   Bilirubin Urine NEGATIVE NEGATIVE   Ketones, ur NEGATIVE NEGATIVE mg/dL   Protein, ur TRACE (A) NEGATIVE mg/dL   Nitrite POSITIVE (A) NEGATIVE   Leukocytes,Ua SMALL (A) NEGATIVE   Squamous Epithelial / HPF 0-5 0 - 5 /HPF   WBC, UA >50 0 - 5 WBC/hpf   RBC / HPF 6-10 0 - 5 RBC/hpf   Bacteria, UA MANY (A) NONE SEEN   WBC Clumps PRESENT   Basic metabolic panel     Status: Abnormal   Collection Time: 05/21/24  2:45 PM  Result Value Ref Range   Sodium 132 (L) 135 - 145 mmol/L   Potassium 4.3 3.5 - 5.1 mmol/L   Chloride 95 (L) 98 - 111 mmol/L   CO2 25 22 - 32 mmol/L   Glucose, Bld 250 (H) 70 - 99 mg/dL   BUN 19 8 - 23 mg/dL   Creatinine, Ser 8.97 (H) 0.44 - 1.00 mg/dL   Calcium 89.9 8.9 - 89.6 mg/dL   GFR, Estimated 57 (L) >60 mL/min   Anion gap 12 5 - 15   No results found.  ED Clinical Impression  1. Complicated UTI (urinary tract infection)   2. Recurrent UTI      ED Assessment/Plan     Previous records, labs reviewed.  As noted in HPI.  UA consistent with a UTI.  Sending this off for culture to confirm antibiotic choice.  Presentation consistent with a pyelonephritis/complicated urinary tract infection.  Offered to do a KUB to evaluate for nephrolithiasis given her history of obstructing nephrolithiasis, but patient declined stating that this does not feel like previous kidney stones. Will also check BMP as last creatinine was almost a year ago.  Giving 1 g of Rocephin , Zofran  4 mg ODT here.  Calculated creatinine clearance on labs done from 05/25/2023 57 mL/min.  Will recheck creatinine clearance today.  Will send home with Zofran  4 mg 3 times daily, Pyridium  200 mg p.o. 3 times daily for  2 days if creatinine clearance is equal or greater to 50 mL/min, Omnicef  300 mg p.o. twice daily for 10 days.  She has follow-up with urology at the end of this month.  Strict ER  return precautions given  Creatinine clearance from labs today 50 mL/min.  No change in plan.  Sent patient MyChart note.  Discussed labs, MDM, treatment plan, and plan for follow-up with patient Discussed sn/sx that should prompt return to the ED. patient agrees with plan.   Meds ordered this encounter  Medications   cefTRIAXone  (ROCEPHIN ) injection 1 g   ondansetron  (ZOFRAN -ODT) disintegrating tablet 4 mg   cefdinir  (OMNICEF ) 300 MG capsule    Sig: Take 1 capsule (300 mg total) by mouth 2 (two) times daily.    Dispense:  20 capsule    Refill:  0   phenazopyridine  (PYRIDIUM ) 200 MG tablet    Sig: Take 1 tablet (200 mg total) by mouth 3 (three) times daily as needed for pain.    Dispense:  6 tablet    Refill:  0   ondansetron  (ZOFRAN -ODT) 4 MG disintegrating tablet    Sig: Take 1 tablet (4 mg total) by mouth every 8 (eight) hours as needed for nausea or vomiting.    Dispense:  20 tablet    Refill:  0      *This clinic note was created using Scientist, clinical (histocompatibility and immunogenetics). Therefore, there may be occasional mistakes despite careful proofreading. ?    Van Knee, MD 05/25/24 [phone]

## 2024-05-21 NOTE — ED Triage Notes (Signed)
 Pt presents with dysuria, lower abdominal pain and flank pain since yesterday. Pt has not taken anything for the pain.

## 2024-05-21 NOTE — Discharge Instructions (Signed)
 I have given you 1 g of Rocephin  here for complicated urinary tract infection/kidney infection.  I have given you 4 mg of Zofran  for nausea.  Make sure you drink plenty of extra fluids.  I am checking your kidney function.  We will contact you if we need to change the dosages of any of your medications.  Take Tylenol  1000 mg 3-4 times a day.  Zofran  up to 3 times a day as needed for nausea.  Finish the Omnicef , even if you feel better.  Pyridium  will turn your urine orange, but will help with your symptoms.  Go to the ER for fevers above 100.4, worsening pain, or for any other concerns

## 2024-05-23 LAB — URINE CULTURE: Culture: >=100000 — AB

## 2024-05-29 ENCOUNTER — Encounter: Payer: Self-pay | Admitting: Internal Medicine

## 2024-05-29 ENCOUNTER — Ambulatory Visit (INDEPENDENT_AMBULATORY_CARE_PROVIDER_SITE_OTHER): Admitting: Internal Medicine

## 2024-05-29 VITALS — BP 120/58 | HR 67 | Ht 62.0 in | Wt 147.0 lb

## 2024-05-29 DIAGNOSIS — F324 Major depressive disorder, single episode, in partial remission: Secondary | ICD-10-CM

## 2024-05-29 DIAGNOSIS — E1169 Type 2 diabetes mellitus with other specified complication: Secondary | ICD-10-CM

## 2024-05-29 DIAGNOSIS — Z0000 Encounter for general adult medical examination without abnormal findings: Secondary | ICD-10-CM | POA: Diagnosis not present

## 2024-05-29 DIAGNOSIS — K219 Gastro-esophageal reflux disease without esophagitis: Secondary | ICD-10-CM | POA: Diagnosis not present

## 2024-05-29 DIAGNOSIS — Z720 Tobacco use: Secondary | ICD-10-CM

## 2024-05-29 DIAGNOSIS — Z7984 Long term (current) use of oral hypoglycemic drugs: Secondary | ICD-10-CM

## 2024-05-29 DIAGNOSIS — E785 Hyperlipidemia, unspecified: Secondary | ICD-10-CM

## 2024-05-29 DIAGNOSIS — E118 Type 2 diabetes mellitus with unspecified complications: Secondary | ICD-10-CM | POA: Diagnosis not present

## 2024-05-29 DIAGNOSIS — Z1231 Encounter for screening mammogram for malignant neoplasm of breast: Secondary | ICD-10-CM | POA: Diagnosis not present

## 2024-05-29 DIAGNOSIS — N390 Urinary tract infection, site not specified: Secondary | ICD-10-CM

## 2024-05-29 MED ORDER — METFORMIN HCL 1000 MG PO TABS: ORAL_TABLET | ORAL | 1 refills | Status: DC

## 2024-05-29 MED ORDER — PRAVASTATIN SODIUM 20 MG PO TABS: 20.0000 mg | ORAL_TABLET | Freq: Every day | ORAL | 1 refills | Status: DC

## 2024-05-29 NOTE — Assessment & Plan Note (Signed)
 Reflux symptoms are controlled on pantoprazole. Patient denies red flag symptoms - no melena, weight loss, dysphagia.

## 2024-05-29 NOTE — Assessment & Plan Note (Signed)
 She continues to smoke and is not interested in LDCT screening.

## 2024-05-29 NOTE — Assessment & Plan Note (Signed)
 Clinically stable on clonazepam  and Trazodone.  Dr. Daniel has left and she is going to see a new Psych in October at [facility]. No SI or HI on evaluation.  She did not tolerate Prozac.

## 2024-05-29 NOTE — Assessment & Plan Note (Signed)
 Blood sugars have been stable.  No hypoglycemic events since last visit. Currently medications are Farxiga  and MTF. Last visit medical regimen changes were to increase MTF to a whole tablet in the AM and continue 1/2 tab in PM. Lab Results  Component Value Date   HGBA1C 8.6 (H) 01/24/2024

## 2024-05-29 NOTE — Assessment & Plan Note (Addendum)
 LDL is  Lab Results  Component Value Date   LDLCALC 102 (H) 05/25/2023   Currently taking pravastatin .  No medication side effects or other concerns. Recommended LDL goal is < 70.

## 2024-05-29 NOTE — Progress Notes (Signed)
 Date:  05/29/2024   Name:  Samantha Welch   DOB:  June 04, 1951   MRN:  [id number]   Chief Complaint: Annual Exam Samantha Welch is a 73 y.o. female who presents today for her Complete Annual Exam. She feels poorly. She reports exercising excercises at home. She reports she is sleeping fairly well. Breast complaints none.  Health Maintenance  Topic Date Due   Screening for Lung Cancer  Never done   COVID-19 Vaccine (1 - 2024-25 season) Never done   Breast Cancer Screening  07/13/2024   Flu Shot  12/11/2024*   Hemoglobin A1C  07/26/2024   Yearly kidney health urinalysis for diabetes  01/23/2025   Medicare Annual Wellness Visit  01/31/2025   Eye exam for diabetics  02/07/2025   Yearly kidney function blood test for diabetes  05/21/2025   Complete foot exam   05/29/2025   Colon Cancer Screening  10/05/2026   DTaP/Tdap/Td vaccine (2 - Td or Tdap) 02/25/2032   Pneumococcal Vaccine for age over 3  Completed   DEXA scan (bone density measurement)  Completed   Hepatitis C Screening  Completed   Zoster (Shingles) Vaccine  Completed   HPV Vaccine  Aged Out   Meningitis B Vaccine  Aged Out  *Topic was postponed. The date shown is not the original due date.    Diabetes She presents for her follow-up diabetic visit. She has type 2 diabetes mellitus. Pertinent negatives for hypoglycemia include no dizziness or headaches. Pertinent negatives for diabetes include no chest pain, no fatigue and no weakness. Current diabetic treatments: Farxiga  and MTF.  Gastroesophageal Reflux She reports no abdominal pain, no chest pain, no coughing or no wheezing. Pertinent negatives include no fatigue. She has tried a PPI for the symptoms.  Hyperlipidemia This is a chronic problem. Pertinent negatives include no chest pain, myalgias or shortness of breath. Current antihyperlipidemic treatment includes statins. The current treatment provides significant improvement of lipids.  Depression        This is a chronic  (followed by Psych) problem.The problem is unchanged.  Associated symptoms include no fatigue, no myalgias and no headaches.   Review of Systems  Constitutional:  Negative for fatigue and unexpected weight change.  HENT:  Negative for trouble swallowing.   Eyes:  Negative for visual disturbance.  Respiratory:  Negative for cough, chest tightness, shortness of breath and wheezing.   Cardiovascular:  Negative for chest pain, palpitations and leg swelling.  Gastrointestinal:  Negative for abdominal pain, constipation and diarrhea.  Musculoskeletal:  Negative for arthralgias and myalgias.  Neurological:  Negative for dizziness, weakness, light-headedness and headaches.  Psychiatric/Behavioral:  Positive for depression.      Lab Results  Component Value Date   NA 132 (L) 05/21/2024   K 4.3 05/21/2024   CO2 25 05/21/2024   GLUCOSE 250 (H) 05/21/2024   BUN 19 05/21/2024   CREATININE 1.02 (H) 05/21/2024   CALCIUM 10.0 05/21/2024   EGFR 69 05/25/2023   GFRNONAA 57 (L) 05/21/2024   Lab Results  Component Value Date   CHOL 171 05/25/2023   HDL 46 05/25/2023   LDLCALC 102 (H) 05/25/2023   TRIG 132 05/25/2023   CHOLHDL 3.7 05/25/2023   No results found for: TSH Lab Results  Component Value Date   HGBA1C 8.6 (H) 01/24/2024   Lab Results  Component Value Date   WBC 11.1 (H) 09/21/2022   HGB 15.4 09/21/2022   HCT 46.3 09/21/2022   MCV 93 09/21/2022  PLT 319 09/21/2022   Lab Results  Component Value Date   ALT 17 05/25/2023   AST 15 05/25/2023   ALKPHOS 50 05/25/2023   BILITOT 0.5 05/25/2023   No results found for: MARIEN BOLLS, VD25OH   Patient Active Problem List   Diagnosis Date Noted   Irritable bowel syndrome with constipation 09/21/2022   Tobacco abuse disorder 03/17/2021   History of kidney stones 12/25/2018   Recurrent UTI 02/10/2016   Major depression single episode, in partial remission (HCC) 03/31/2015   Hyperlipidemia associated with type 2  diabetes mellitus (HCC) 07/25/2014   Type II diabetes mellitus with complication (HCC) 07/25/2014   GERD (gastroesophageal reflux disease) 04/23/2011    Allergies  Allergen Reactions   Venlafaxine  Nausea Only    Past Surgical History:  Procedure Laterality Date   BREAST BIOPSY Right 08/08/2023   rt br calcs, ribbon marker, path pending   BREAST BIOPSY Right 08/08/2023   MM RT BREAST BX W LOC DEV 1ST LESION IMAGE BX SPEC STEREO GUIDE 08/08/2023 [facility]   COLONOSCOPY WITH PROPOFOL  N/A 10/05/2021   Procedure: COLONOSCOPY WITH PROPOFOL ;  Surgeon: Onita Elspeth Sharper, DO;  Location: [facility] END[facility]COPY;  Service: Gastroenterology;  Laterality: N/A;   CYSTOSCOPY W/ URETERAL STENT PLACEMENT Left 12/15/2015   Procedure: CYSTOSCOPY WITH STENT REPLACEMENT;  Surgeon: Rosina Riis, MD;  Location: [facility];  Service: Urology;  Laterality: Left;   CYSTOSCOPY WITH STENT PLACEMENT Left 11/16/2015   Procedure: CYSTOSCOPY WITH LEFT STENT PLACEMENT, LEFT RETROGRADE;  Surgeon: Zina JINNY Irwin, MD;  Location: [facility];  Service: Urology;  Laterality: Left;   FOOT SURGERY Bilateral    KNEE ARTHROSCOPY WITH MEDIAL MENISECTOMY Left 03/20/2020   Procedure: knee arthroscopic partial medial AND lateral meniscectomy;  Surgeon: Tobie Priest, MD;  Location: [facility];  Service: Orthopedics;  Laterality: Left;  Diabetic - oral meds   TUBAL LIGATION     URETEROSCOPY WITH HOLMIUM LASER LITHOTRIPSY Left 12/15/2015   Procedure: URETEROSCOPY WITH HOLMIUM LASER LITHOTRIPSY;  Surgeon: Rosina Riis, MD;  Location: [facility];  Service: Urology;  Laterality: Left;    Social History   Tobacco Use   Smoking status: Every Day    Current packs/day: 0.50    Average packs/day: 0.5 packs/day for 45.7 years (22.9 ttl pk-yrs)    Types: Cigarettes    Start date: 1980    Passive exposure: Current   Smokeless tobacco: Never  Vaping Use   Vaping status: Never Used  Substance Use Topics   Alcohol use:  No    Alcohol/week: 0.0 standard drinks of alcohol   Drug use: No     Medication list has been reviewed and updated.  Current Meds  Medication Sig   aspirin  81 MG chewable tablet Chew 81 mg by mouth daily.   cefdinir  (OMNICEF ) 300 MG capsule Take 1 capsule (300 mg total) by mouth 2 (two) times daily.   clonazePAM  (KLONOPIN ) 1 MG tablet Take 1 mg by mouth 2 (two) times daily.   conjugated estrogens  (PREMARIN ) vaginal cream Place 1 Applicatorful vaginally daily. Apply 0.5mg  (pea-sized amount)  just inside the vaginal introitus with a finger-tip on  Monday, Wednesday and Friday nights.   dicyclomine (BENTYL) 10 MG capsule Take 10 mg by mouth 4 (four) times daily -  before meals and at bedtime.   FARXIGA  5 MG TABS tablet TAKE 1 TABLET BY MOUTH DAILY  BEFORE BREAKFAST   gabapentin (NEURONTIN) 300 MG capsule Take 300 mg by mouth at bedtime as needed.   glucose  blood test strip    meclizine  (ANTIVERT ) 12.5 MG tablet Take 1 tablet (12.5 mg total) by mouth 3 (three) times daily as needed for dizziness.   methenamine  (HIPREX ) 1 g tablet Take 1 g by mouth 2 (two) times daily.   nystatin  ointment (MYCOSTATIN ) Apply 1 Application topically 2 (two) times daily.   ondansetron  (ZOFRAN -ODT) 4 MG disintegrating tablet Take 1 tablet (4 mg total) by mouth every 8 (eight) hours as needed for nausea or vomiting.   pantoprazole  (PROTONIX ) 40 MG tablet TAKE 1 TABLET BY MOUTH AT  BEDTIME   traZODone (DESYREL) 50 MG tablet Take 50-100 mg by mouth at bedtime.   TRULANCE  3 MG TABS TAKE 1 TABLET BY MOUTH ONCE DAILY AT  6AM   [DISCONTINUED] FLUoxetine (PROZAC) 20 MG capsule Take 20 mg by mouth daily.   [DISCONTINUED] metFORMIN  (GLUCOPHAGE ) 1000 MG tablet Take one tablet in the AM and 1/2 tablet in the PM.   [DISCONTINUED] phenazopyridine  (PYRIDIUM ) 200 MG tablet Take 1 tablet (200 mg total) by mouth 3 (three) times daily as needed for pain.   [DISCONTINUED] pravastatin  (PRAVACHOL ) 20 MG tablet TAKE 1 TABLET BY MOUTH  AT  BEDTIME       05/29/2024    1:16 PM 01/24/2024    2:58 PM 09/26/2023    2:49 PM 05/25/2023    2:37 PM  GAD 7 : Generalized Anxiety Score  Nervous, Anxious, on Edge 3 3 2  0  Control/stop worrying 3 3 2  0  Worry too much - different things 3 3 2  0  Trouble relaxing 3 3 0 0  Restless 2 2 0 0  Easily annoyed or irritable 0 0 0 0  Afraid - awful might happen 0 0 0 0  Total GAD 7 Score 14 14 6  0  Anxiety Difficulty Somewhat difficult Somewhat difficult Not difficult at all Not difficult at all       05/29/2024    1:16 PM 02/01/2024    1:34 PM 01/24/2024    2:57 PM  Depression screen PHQ 2/9  Decreased Interest 0 0 3  Down, Depressed, Hopeless 3 3 1   PHQ - 2 Score 3 3 4   Altered sleeping 0 0 0  Tired, decreased energy 3 3 3   Change in appetite 0 0 0  Feeling bad or failure about yourself  0 0 0  Trouble concentrating 0 0 0  Moving slowly or fidgety/restless 0 0 2  Suicidal thoughts 0 0 0  PHQ-9 Score 6 6 9   Difficult doing work/chores Somewhat difficult Very difficult Not difficult at all    BP Readings from Last 3 Encounters:  05/29/24 (!) 120/58  05/21/24 (!) 158/70  02/19/24 (!) 147/92    Physical Exam Vitals and nursing note reviewed.  Constitutional:      General: She is not in acute distress.    Appearance: She is well-developed.  HENT:     Head: Normocephalic and atraumatic.     Right Ear: Tympanic membrane and ear canal normal.     Left Ear: Tympanic membrane and ear canal normal.     Nose:     Right Sinus: No maxillary sinus tenderness.     Left Sinus: No maxillary sinus tenderness.  Eyes:     General: No scleral icterus.       Right eye: No discharge.        Left eye: No discharge.     Conjunctiva/sclera: Conjunctivae normal.  Neck:     Thyroid: No thyromegaly.  Vascular: No carotid bruit.  Cardiovascular:     Rate and Rhythm: Normal rate and regular rhythm.     Pulses: Normal pulses.     Heart sounds: Normal heart sounds.  Pulmonary:      Effort: Pulmonary effort is normal. No respiratory distress.     Breath sounds: No wheezing.  Abdominal:     General: Bowel sounds are normal.     Palpations: Abdomen is soft.     Tenderness: There is no abdominal tenderness.  Musculoskeletal:     Cervical back: Normal range of motion. No erythema.     Right lower leg: No edema.     Left lower leg: No edema.  Lymphadenopathy:     Cervical: No cervical adenopathy.  Skin:    General: Skin is warm and dry.     Findings: No rash.  Neurological:     Mental Status: She is alert and oriented to person, place, and time.     Cranial Nerves: No cranial nerve deficit.     Sensory: No sensory deficit.     Deep Tendon Reflexes: Reflexes are normal and symmetric.  Psychiatric:        Attention and Perception: Attention normal.        Mood and Affect: Mood normal.    Diabetic Foot Exam - Simple   Simple Foot Form Diabetic Foot exam was performed with the following findings: Yes 05/29/2024  1:36 PM  Visual Inspection No deformities, no ulcerations, no other skin breakdown bilaterally: Yes Sensation Testing Intact to touch and monofilament testing bilaterally: Yes Pulse Check Posterior Tibialis and Dorsalis pulse intact bilaterally: Yes Comments      Wt Readings from Last 3 Encounters:  05/29/24 147 lb (66.7 kg)  05/21/24 145 lb (65.8 kg)  02/19/24 143 lb 1.3 oz (64.9 kg)    BP (!) 120/58   Pulse 67   Ht 5' 2 (1.575 m)   Wt 147 lb (66.7 kg)   SpO2 96%   BMI 26.89 kg/m   Assessment and Plan:  Problem List Items Addressed This Visit       Unprioritized   GERD (gastroesophageal reflux disease) (Chronic)   Reflux symptoms are controlled on pantoprazole . Patient denies red flag symptoms - no melena, weight loss, dysphagia.       Relevant Orders   CBC with Differential/Platelet   Hyperlipidemia associated with type 2 diabetes mellitus (HCC) (Chronic)   LDL is  Lab Results  Component Value Date   LDLCALC 102 (H)  05/25/2023   Currently taking pravastatin .  No medication side effects or other concerns. Recommended LDL goal is < 70.       Relevant Medications   metFORMIN  (GLUCOPHAGE ) 1000 MG tablet   pravastatin  (PRAVACHOL ) 20 MG tablet   Other Relevant Orders   Lipid panel   Major depression single episode, in partial remission (HCC) (Chronic)   Clinically stable on clonazepam  and Trazodone.  Dr. Daniel has left and she is going to see a new Psych in October at [facility]. No SI or HI on evaluation.  She did not tolerate Prozac.        Relevant Orders   TSH   Type II diabetes mellitus with complication (HCC) (Chronic)   Blood sugars have been stable.  No hypoglycemic events since last visit. Currently medications are Farxiga  and MTF. Last visit medical regimen changes were to increase MTF to a whole tablet in the AM and continue 1/2 tab in PM. Lab Results  Component Value  Date   HGBA1C 8.6 (H) 01/24/2024          Relevant Medications   metFORMIN  (GLUCOPHAGE ) 1000 MG tablet   pravastatin  (PRAVACHOL ) 20 MG tablet   Other Relevant Orders   CBC with Differential/Platelet   Comprehensive metabolic panel with GFR   Hemoglobin A1c   TSH   Recurrent UTI   Relevant Medications   methenamine  (HIPREX ) 1 g tablet   Tobacco abuse disorder   She continues to smoke and is not interested in LDCT screening.      Other Visit Diagnoses       Annual physical exam    -  Primary   she declines immunizations will schedule mammogram CRC screening up to date     Encounter for screening mammogram for breast cancer       patient to schedule in [place]   Relevant Orders   MM 3D SCREENING MAMMOGRAM BILATERAL BREAST     Long term current use of oral hypoglycemic drug           Return in about 4 months (around 09/28/2024) for [facility]- , DM  Dr. Lemon.    Leita HILARIO Adie, MD [facility] [place]

## 2024-05-29 NOTE — Patient Instructions (Addendum)
 Stop aspirin .  Call [facility] to schedule your mammogram at [phone].

## 2024-05-30 ENCOUNTER — Ambulatory Visit: Payer: Self-pay | Admitting: Internal Medicine

## 2024-05-30 DIAGNOSIS — E118 Type 2 diabetes mellitus with unspecified complications: Secondary | ICD-10-CM

## 2024-05-30 LAB — LIPID PANEL
Chol/HDL Ratio: 3.6 ratio (ref 0.0–4.4)
Cholesterol, Total: 167 mg/dL (ref 100–199)
HDL: 46 mg/dL (ref >39)
LDL Chol Calc (NIH): 90 mg/dL (ref 0–99)
Triglycerides: 180 mg/dL — ABNORMAL HIGH (ref 0–149)
VLDL Cholesterol Cal: 31 mg/dL (ref 5–40)

## 2024-05-30 LAB — COMPREHENSIVE METABOLIC PANEL WITH GFR
ALT: 17 IU/L (ref 0–32)
AST: 9 IU/L (ref 0–40)
Albumin: 4.6 g/dL (ref 3.8–4.8)
Alkaline Phosphatase: 47 IU/L — ABNORMAL LOW (ref 49–135)
BUN/Creatinine Ratio: 14 (ref 12–28)
BUN: 13 mg/dL (ref 8–27)
Bilirubin Total: 0.7 mg/dL (ref 0.0–1.2)
CO2: 24 mmol/L (ref 20–29)
Calcium: 10 mg/dL (ref 8.7–10.3)
Chloride: 97 mmol/L (ref 96–106)
Creatinine, Ser: 0.95 mg/dL (ref 0.57–1.00)
Globulin, Total: 2.3 g/dL (ref 1.5–4.5)
Glucose: 296 mg/dL — ABNORMAL HIGH (ref 70–99)
Potassium: 4.3 mmol/L (ref 3.5–5.2)
Sodium: 137 mmol/L (ref 134–144)
Total Protein: 6.9 g/dL (ref 6.0–8.5)
eGFR: 62 mL/min/1.73 (ref >59)

## 2024-05-30 LAB — TSH: TSH: 0.778 u[IU]/mL (ref 0.450–4.500)

## 2024-05-30 LAB — CBC WITH DIFFERENTIAL/PLATELET
Basophils Absolute: 0.1 x10E3/uL (ref 0.0–0.2)
Basos: 1 %
EOS (ABSOLUTE): 0.1 x10E3/uL (ref 0.0–0.4)
Eos: 1 %
Hematocrit: 47.2 % — ABNORMAL HIGH (ref 34.0–46.6)
Hemoglobin: 15.8 g/dL (ref 11.1–15.9)
Immature Grans (Abs): 0.1 x10E3/uL (ref 0.0–0.1)
Immature Granulocytes: 1 %
Lymphocytes Absolute: 2.6 x10E3/uL (ref 0.7–3.1)
Lymphs: 28 %
MCH: 32.1 pg (ref 26.6–33.0)
MCHC: 33.5 g/dL (ref 31.5–35.7)
MCV: 96 fL (ref 79–97)
Monocytes Absolute: 0.7 x10E3/uL (ref 0.1–0.9)
Monocytes: 8 %
Neutrophils Absolute: 5.9 x10E3/uL (ref 1.4–7.0)
Neutrophils: 61 %
Platelets: 310 x10E3/uL (ref 150–450)
RBC: 4.92 x10E6/uL (ref 3.77–5.28)
RDW: 12.4 % (ref 11.7–15.4)
WBC: 9.3 x10E3/uL (ref 3.4–10.8)

## 2024-05-30 LAB — HEMOGLOBIN A1C
Est. average glucose Bld gHb Est-mCnc: 220 mg/dL
Hgb A1c MFr Bld: 9.3 % — ABNORMAL HIGH (ref 4.8–5.6)

## 2024-05-30 MED ORDER — DAPAGLIFLOZIN PROPANEDIOL 10 MG PO TABS: 10.0000 mg | ORAL_TABLET | Freq: Every day | ORAL | 1 refills | Status: AC

## 2024-05-30 MED ORDER — METFORMIN HCL 1000 MG PO TABS
1000.0000 mg | ORAL_TABLET | Freq: Two times a day (BID) | ORAL | 1 refills | Status: DC
Start: 2024-05-30 — End: 2024-07-18
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
## 2024-06-11 ENCOUNTER — Ambulatory Visit (INDEPENDENT_AMBULATORY_CARE_PROVIDER_SITE_OTHER): Admitting: Urology

## 2024-06-11 ENCOUNTER — Encounter: Payer: Self-pay | Admitting: Urology

## 2024-06-11 VITALS — BP 135/75 | HR 78 | Wt 143.0 lb

## 2024-06-11 DIAGNOSIS — N952 Postmenopausal atrophic vaginitis: Secondary | ICD-10-CM | POA: Diagnosis not present

## 2024-06-11 DIAGNOSIS — N200 Calculus of kidney: Secondary | ICD-10-CM | POA: Diagnosis not present

## 2024-06-11 DIAGNOSIS — N390 Urinary tract infection, site not specified: Secondary | ICD-10-CM

## 2024-06-11 LAB — BLADDER SCAN AMB NON-IMAGING

## 2024-06-11 NOTE — Patient Instructions (Signed)
 Please call [phone] to schedule renal ultrasound

## 2024-06-11 NOTE — Telephone Encounter (Signed)
 Please review.  KP

## 2024-06-12 ENCOUNTER — Other Ambulatory Visit: Payer: Self-pay | Admitting: Internal Medicine

## 2024-06-12 DIAGNOSIS — R110 Nausea: Secondary | ICD-10-CM

## 2024-06-12 DIAGNOSIS — F324 Major depressive disorder, single episode, in partial remission: Secondary | ICD-10-CM

## 2024-06-12 MED ORDER — CLONAZEPAM 1 MG PO TABS: 1.0000 mg | ORAL_TABLET | Freq: Two times a day (BID) | ORAL | 0 refills | Status: AC | PRN

## 2024-06-12 MED ORDER — ONDANSETRON 4 MG PO TBDP: 4.0000 mg | ORAL_TABLET | Freq: Three times a day (TID) | ORAL | 0 refills | Status: DC | PRN

## 2024-06-12 NOTE — Telephone Encounter (Signed)
 Pt response.  KP

## 2024-06-12 NOTE — Progress Notes (Unsigned)
 Date:  06/12/2024   Name:  Samantha Welch   DOB:  1951/02/03   MRN:  [id number]   Chief Complaint: No chief complaint on file.  HPI  Review of Systems   Lab Results  Component Value Date   NA 137 05/29/2024   K 4.3 05/29/2024   CO2 24 05/29/2024   GLUCOSE 296 (H) 05/29/2024   BUN 13 05/29/2024   CREATININE 0.95 05/29/2024   CALCIUM 10.0 05/29/2024   EGFR 62 05/29/2024   GFRNONAA 57 (L) 05/21/2024   Lab Results  Component Value Date   CHOL 167 05/29/2024   HDL 46 05/29/2024   LDLCALC 90 05/29/2024   TRIG 180 (H) 05/29/2024   CHOLHDL 3.6 05/29/2024   Lab Results  Component Value Date   TSH 0.778 05/29/2024   Lab Results  Component Value Date   HGBA1C 9.3 (H) 05/29/2024   Lab Results  Component Value Date   WBC 9.3 05/29/2024   HGB 15.8 05/29/2024   HCT 47.2 (H) 05/29/2024   MCV 96 05/29/2024   PLT 310 05/29/2024   Lab Results  Component Value Date   ALT 17 05/29/2024   AST 9 05/29/2024   ALKPHOS 47 (L) 05/29/2024   BILITOT 0.7 05/29/2024   No results found for: MARIEN BOLLS, VD25OH   Patient Active Problem List   Diagnosis Date Noted   Irritable bowel syndrome with constipation 09/21/2022   Tobacco abuse disorder 03/17/2021   History of kidney stones 12/25/2018   Recurrent UTI 02/10/2016   Major depression single episode, in partial remission 03/31/2015   Hyperlipidemia associated with type 2 diabetes mellitus (HCC) 07/25/2014   Type II diabetes mellitus with complication (HCC) 07/25/2014   GERD (gastroesophageal reflux disease) 04/23/2011    Allergies  Allergen Reactions   Venlafaxine  Nausea Only    Past Surgical History:  Procedure Laterality Date   BREAST BIOPSY Right 08/08/2023   rt br calcs, ribbon marker, path pending   BREAST BIOPSY Right 08/08/2023   MM RT BREAST BX W LOC DEV 1ST LESION IMAGE BX SPEC STEREO GUIDE 08/08/2023 [facility]   COLONOSCOPY WITH PROPOFOL  N/A 10/05/2021   Procedure: COLONOSCOPY WITH  PROPOFOL ;  Surgeon: Onita Elspeth Sharper, DO;  Location: [facility];  Service: Gastroenterology;  Laterality: N/A;   CYSTOSCOPY W/ URETERAL STENT PLACEMENT Left 12/15/2015   Procedure: CYSTOSCOPY WITH STENT REPLACEMENT;  Surgeon: Rosina Riis, MD;  Location: [facility];  Service: Urology;  Laterality: Left;   CYSTOSCOPY WITH STENT PLACEMENT Left 11/16/2015   Procedure: CYSTOSCOPY WITH LEFT STENT PLACEMENT, LEFT RETROGRADE;  Surgeon: Zina JINNY Irwin, MD;  Location: [facility];  Service: Urology;  Laterality: Left;   FOOT SURGERY Bilateral    KNEE ARTHROSCOPY WITH MEDIAL MENISECTOMY Left 03/20/2020   Procedure: knee arthroscopic partial medial AND lateral meniscectomy;  Surgeon: Tobie Priest, MD;  Location: [facility];  Service: Orthopedics;  Laterality: Left;  Diabetic - oral meds   TUBAL LIGATION     URETEROSCOPY WITH HOLMIUM LASER LITHOTRIPSY Left 12/15/2015   Procedure: URETEROSCOPY WITH HOLMIUM LASER LITHOTRIPSY;  Surgeon: Rosina Riis, MD;  Location: [facility];  Service: Urology;  Laterality: Left;    Social History   Tobacco Use   Smoking status: Every Day    Current packs/day: 0.50    Average packs/day: 0.5 packs/day for 45.7 years (22.9 ttl pk-yrs)    Types: Cigarettes    Start date: 1980    Passive exposure: Current   Smokeless tobacco: Never  Vaping Use  Vaping status: Never Used  Substance Use Topics   Alcohol use: No    Alcohol/week: 0.0 standard drinks of alcohol   Drug use: No     Medication list has been reviewed and updated.  No outpatient medications have been marked as taking for the 06/12/24 encounter (Orders Only) with Justus Leita DEL, MD.       05/29/2024    1:16 PM 01/24/2024    2:58 PM 09/26/2023    2:49 PM 05/25/2023    2:37 PM  GAD 7 : Generalized Anxiety Score  Nervous, Anxious, on Edge 3 3 2  0  Control/stop worrying 3 3 2  0  Worry too much - different things 3 3 2  0  Trouble relaxing 3 3 0 0  Restless 2 2 0 0  Easily  annoyed or irritable 0 0 0 0  Afraid - awful might happen 0 0 0 0  Total GAD 7 Score 14 14 6  0  Anxiety Difficulty Somewhat difficult Somewhat difficult Not difficult at all Not difficult at all       05/29/2024    1:16 PM 02/01/2024    1:34 PM 01/24/2024    2:57 PM  Depression screen PHQ 2/9  Decreased Interest 0 0 3  Down, Depressed, Hopeless 3 3 1   PHQ - 2 Score 3 3 4   Altered sleeping 0 0 0  Tired, decreased energy 3 3 3   Change in appetite 0 0 0  Feeling bad or failure about yourself  0 0 0  Trouble concentrating 0 0 0  Moving slowly or fidgety/restless 0 0 2  Suicidal thoughts 0 0 0  PHQ-9 Score 6 6 9   Difficult doing work/chores Somewhat difficult Very difficult Not difficult at all    BP Readings from Last 3 Encounters:  06/11/24 135/75  05/29/24 (!) 120/58  05/21/24 (!) 158/70    Physical Exam  Wt Readings from Last 3 Encounters:  06/11/24 143 lb (64.9 kg)  05/29/24 147 lb (66.7 kg)  05/21/24 145 lb (65.8 kg)    There were no vitals taken for this visit.  Assessment and Plan:  Problem List Items Addressed This Visit   None   No follow-ups on file.    Leita HILARIO Justus, MD [facility] [place]

## 2024-06-26 ENCOUNTER — Ambulatory Visit
Admission: RE | Admit: 2024-06-26 | Discharge: 2024-06-26 | Disposition: A | Discharge status: Home / Self Care | Source: Ambulatory Visit | Attending: Urology | Admitting: Urology

## 2024-06-26 DIAGNOSIS — N390 Urinary tract infection, site not specified: Secondary | ICD-10-CM | POA: Diagnosis present
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
## 2024-07-02 ENCOUNTER — Encounter: Payer: Self-pay | Admitting: Psychiatry

## 2024-07-02 ENCOUNTER — Ambulatory Visit: Admitting: Urology

## 2024-07-02 ENCOUNTER — Ambulatory Visit (INDEPENDENT_AMBULATORY_CARE_PROVIDER_SITE_OTHER): Payer: Self-pay | Admitting: Psychiatry

## 2024-07-02 ENCOUNTER — Other Ambulatory Visit: Payer: Self-pay

## 2024-07-02 VITALS — BP 136/75 | HR 69 | Temp 97.4°F | Ht 62.0 in | Wt 146.2 lb

## 2024-07-02 DIAGNOSIS — Z79899 Other long term (current) drug therapy: Secondary | ICD-10-CM

## 2024-07-02 DIAGNOSIS — F331 Major depressive disorder, recurrent, moderate: Secondary | ICD-10-CM

## 2024-07-02 DIAGNOSIS — F411 Generalized anxiety disorder: Secondary | ICD-10-CM | POA: Diagnosis not present

## 2024-07-02 MED ORDER — BUSPIRONE HCL 5 MG PO TABS: 5.0000 mg | ORAL_TABLET | Freq: Two times a day (BID) | ORAL | 1 refills | Status: DC

## 2024-07-02 NOTE — Patient Instructions (Signed)
 Start buspar 5 mg twice a day Continue trazodone 50 mg at night as needed for insomnia Obtain urine drug screening  Continue clonazepam  1 mg twice a day as needed for anxiety Next appointment- 12/8 at 1 PM

## 2024-07-04 ENCOUNTER — Ambulatory Visit: Payer: Self-pay | Admitting: Psychiatry

## 2024-07-04 ENCOUNTER — Other Ambulatory Visit: Payer: Self-pay | Admitting: Psychiatry

## 2024-07-04 DIAGNOSIS — F324 Major depressive disorder, single episode, in partial remission: Secondary | ICD-10-CM

## 2024-07-04 LAB — MONITOR DRUG PROFILE 10(MW)
Amphetamine Scrn, Ur: NEGATIVE ng/mL
BARBITURATE SCREEN URINE: NEGATIVE ng/mL
BENZODIAZEPINE SCREEN, URINE: NEGATIVE ng/mL
CANNABINOIDS UR QL SCN: NEGATIVE ng/mL
Cocaine (Metab) Scrn, Ur: NEGATIVE ng/mL
Creatinine(Crt), U: 42.4 mg/dL (ref 20.0–300.0)
Methadone Screen, Urine: NEGATIVE ng/mL
OXYCODONE+OXYMORPHONE UR QL SCN: NEGATIVE ng/mL
Opiate Scrn, Ur: NEGATIVE ng/mL
Ph of Urine: 5.1 (ref 4.5–8.9)
Phencyclidine Qn, Ur: NEGATIVE ng/mL
Propoxyphene Scrn, Ur: NEGATIVE ng/mL

## 2024-07-04 MED ORDER — CLONAZEPAM 1 MG PO TABS: 1.0000 mg | ORAL_TABLET | Freq: Two times a day (BID) | ORAL | 1 refills | Status: DC | PRN

## 2024-07-04 NOTE — Progress Notes (Signed)
 Her urine screening was negative. Please inform her that clonazepam  has been ordered to her pharmacy as discussed, and she may pick it up when it is due.

## 2024-07-05 NOTE — Progress Notes (Signed)
 Called patient to discuss message from provider no answer left voicemail for patient to return call to office

## 2024-07-06 NOTE — Progress Notes (Signed)
 Made a 2nd attempt to reach patient no answer left voicemail will attempt to leave a MyChart message

## 2024-07-08 NOTE — Progress Notes (Unsigned)
 07/09/2024 8:45 PM   Samantha Welch 1950-12-26 [id number]  Referring provider: Justus Leita DEL, MD [address] [place],  [place] [address]  Urological history: 1. Nephrolithiasis - stone composition of 3% calcium oxalate dihydrate, 77% calcium oxalate monohydrate and 10% calcium phosphate stone - left URS (2017) - contrast CT (05/2022) - no stones   2. rUTI's -contributing factors of age, GSM, diabetes and constipation -contrast CT (05/2022) - no nidus for infection -cysto (07/2023) mild erythema at bladder neck  -urine cytology (11/20244) - negative  - May 21, 2024 - citrobacter koseri - February 19, 2024 - citrobacter koseri - October 16, 2023 - enterobacter cloacae - September 26, 2023 - hafnia alvei - vaginal estrogen cream, cranberry tablets and Hiprex    No chief complaint on file.  HPI: Samantha Welch is a 73 y.o. female who presents today for RUS report.    Previous records reviewed.   She underwent a RUS on 06/26/2024 to evaluate for any pathology that may be contributing to her recurrent UTI's.  RUS did not demonstrate any hydronephrosis or stones.    PMH: Past Medical History:  Diagnosis Date   Anxiety    Arthritis    Cancer (HCC)    skin ca on face   Chronic kidney disease    Left kidney stone   COPD (chronic obstructive pulmonary disease) (HCC)    Depression    Diabetes mellitus without complication (HCC)    GERD (gastroesophageal reflux disease)    Hyperlipemia    Stomach ulcer    Tobacco abuse disorder     Surgical History: Past Surgical History:  Procedure Laterality Date   BREAST BIOPSY Right 08/08/2023   rt br calcs, ribbon marker, path pending   BREAST BIOPSY Right 08/08/2023   MM RT BREAST BX W LOC DEV 1ST LESION IMAGE BX SPEC STEREO GUIDE 08/08/2023 [facility]   COLONOSCOPY WITH PROPOFOL  N/A 10/05/2021   Procedure: COLONOSCOPY WITH PROPOFOL ;  Surgeon: Onita Elspeth Sharper, DO;  Location: [facility] END[facility];  Service:  Gastroenterology;  Laterality: N/A;   CYSTOSCOPY W/ URETERAL STENT PLACEMENT Left 12/15/2015   Procedure: CYSTOSCOPY WITH STENT REPLACEMENT;  Surgeon: Rosina Riis, MD;  Location: [facility];  Service: Urology;  Laterality: Left;   CYSTOSCOPY WITH STENT PLACEMENT Left 11/16/2015   Procedure: CYSTOSCOPY WITH LEFT STENT PLACEMENT, LEFT RETROGRADE;  Surgeon: Zina JINNY Irwin, MD;  Location: [facility];  Service: Urology;  Laterality: Left;   FOOT SURGERY Bilateral    KNEE ARTHROSCOPY WITH MEDIAL MENISECTOMY Left 03/20/2020   Procedure: knee arthroscopic partial medial AND lateral meniscectomy;  Surgeon: Tobie Priest, MD;  Location: [facility];  Service: Orthopedics;  Laterality: Left;  Diabetic - oral meds   TUBAL LIGATION     URETEROSCOPY WITH HOLMIUM LASER LITHOTRIPSY Left 12/15/2015   Procedure: URETEROSCOPY WITH HOLMIUM LASER LITHOTRIPSY;  Surgeon: Rosina Riis, MD;  Location: [facility];  Service: Urology;  Laterality: Left;    Home Medications:  Allergies as of 07/09/2024       Reactions   Venlafaxine  Nausea Only        Medication List        Accurate as of July 08, 2024  8:45 PM. If you have any questions, ask your nurse or doctor.          aspirin  81 MG chewable tablet Chew 81 mg by mouth daily.   busPIRone 5 MG tablet Commonly known as: BUSPAR Take 1 tablet (5 mg total) by mouth 2 (two)  times daily.   cefdinir  300 MG capsule Commonly known as: OMNICEF  Take 1 capsule (300 mg total) by mouth 2 (two) times daily.   clonazePAM  1 MG tablet Commonly known as: KLONOPIN  Take 1 tablet (1 mg total) by mouth 2 (two) times daily as needed for anxiety.   clonazePAM  1 MG tablet Commonly known as: KLONOPIN  Take 1 tablet (1 mg total) by mouth 2 (two) times daily as needed for anxiety. Start taking on: July 12, 2024   dapagliflozin  propanediol 10 MG Tabs tablet Commonly known as: FARXIGA  Take 1 tablet (10 mg total) by mouth daily.   dicyclomine 10 MG  capsule Commonly known as: BENTYL Take 10 mg by mouth 4 (four) times daily -  before meals and at bedtime.   gabapentin 300 MG capsule Commonly known as: NEURONTIN Take 300 mg by mouth at bedtime as needed.   glucose blood test strip   meclizine  12.5 MG tablet Commonly known as: ANTIVERT  Take 1 tablet (12.5 mg total) by mouth 3 (three) times daily as needed for dizziness.   metFORMIN  1000 MG tablet Commonly known as: GLUCOPHAGE  Take 1 tablet (1,000 mg total) by mouth 2 (two) times daily with a meal.   methenamine  1 g tablet Commonly known as: HIPREX  Take 1 g by mouth 2 (two) times daily.   nystatin  ointment Commonly known as: MYCOSTATIN  Apply 1 Application topically 2 (two) times daily.   ondansetron  4 MG disintegrating tablet Commonly known as: ZOFRAN -ODT Take 1 tablet (4 mg total) by mouth every 8 (eight) hours as needed for nausea or vomiting.   pantoprazole  40 MG tablet Commonly known as: PROTONIX  TAKE 1 TABLET BY MOUTH AT  BEDTIME   pravastatin  20 MG tablet Commonly known as: PRAVACHOL  Take 1 tablet (20 mg total) by mouth at bedtime.   Premarin  vaginal cream Generic drug: conjugated estrogens  Place 1 Applicatorful vaginally daily. Apply 0.5mg  (pea-sized amount)  just inside the vaginal introitus with a finger-tip on  Monday, Wednesday and Friday nights.   traZODone 50 MG tablet Commonly known as: DESYREL Take 50-100 mg by mouth at bedtime.   Trulance  3 MG Tabs Generic drug: Plecanatide  TAKE 1 TABLET BY MOUTH ONCE DAILY AT  6AM        Allergies:  Allergies  Allergen Reactions   Venlafaxine  Nausea Only    Family History: Family History  Problem Relation Age of Onset   Heart disease Mother    Diabetes Mother    Emphysema Father    Anxiety disorder Sister    Colon cancer Brother    Bladder Cancer Neg Hx    Kidney cancer Neg Hx    Prostate cancer Neg Hx    Breast cancer Neg Hx     Social History:  reports that she has been smoking cigarettes.  She started smoking about 45 years ago. She has a 22.9 pack-year smoking history. She has been exposed to tobacco smoke. She has never used smokeless tobacco. She reports that she does not drink alcohol and does not use drugs.  ROS: Pertinent ROS in HPI  Physical Exam: There were no vitals taken for this visit.  Constitutional:  Well nourished. Alert and oriented, No acute distress. HEENT: [place] AT, moist mucus membranes.  Trachea midline, no masses. Cardiovascular: No clubbing, cyanosis, or edema. Respiratory: Normal respiratory effort, no increased work of breathing. GU: No CVA tenderness.  No bladder fullness or masses.  Recession of labia minora, dry, pale vulvar vaginal mucosa and loss of mucosal ridges and folds.  Normal urethral meatus, no lesions, no prolapse, no discharge.   No urethral masses, tenderness and/or tenderness. No bladder fullness, tenderness or masses. *** vagina mucosa, *** estrogen effect, no discharge, no lesions, *** pelvic support, *** cystocele and *** rectocele noted.  No cervical motion tenderness.  Uterus is freely mobile and non-fixed.  No adnexal/parametria masses or tenderness noted.  Anus and perineum are without rashes or lesions.   ***  Neurologic: Grossly intact, no focal deficits, moving all 4 extremities. Psychiatric: Normal mood and affect.    Laboratory Data: See HPI and EPIC I have reviewed the labs.   Pertinent Imaging: CLINICAL DATA:  recurrent UTI   EXAM: RENAL / URINARY TRACT ULTRASOUND COMPLETE   COMPARISON:  May 19, 2022   FINDINGS: Right Kidney:   Renal measurements: 11.0 x 4.1 x 4.4 cm = volume: 154 mL. Echogenicity within normal limits. No mass or hydronephrosis visualized.   Left Kidney:   Renal measurements: 10.1 x 4.1 x 4.6 cm = volume: 99 mL. Echogenicity within normal limits. No mass or hydronephrosis visualized.   Bladder:   Appears normal for degree of bladder distention.   Other:   None.   IMPRESSION: No  hydronephrosis.     Electronically Signed   By: Corean Salter M.D.   On: 06/30/2024 10:03 I have independently reviewed the films.  See HPI.     Assessment & Plan:    1. rUTI's - RUS did not show any pathology that would be contributing to her rUTI's  2. Nephrolithiasis - RUS did not show any stones  3. GSM - Continue applying vaginal estrogen cream 3 nights weekly        No follow-ups on file.  These notes generated with voice recognition software. I apologize for typographical errors.  Samantha Welch  [facility] [address]  [address] [place], [place] [address] [phone]

## 2024-07-09 ENCOUNTER — Other Ambulatory Visit
Admission: RE | Admit: 2024-07-09 | Discharge: 2024-07-09 | Disposition: A | Discharge status: Home / Self Care | Attending: Urology | Admitting: Urology

## 2024-07-09 ENCOUNTER — Ambulatory Visit (INDEPENDENT_AMBULATORY_CARE_PROVIDER_SITE_OTHER): Admitting: Urology

## 2024-07-09 ENCOUNTER — Encounter: Payer: Self-pay | Admitting: Urology

## 2024-07-09 VITALS — BP 153/75 | HR 71 | Wt 143.0 lb

## 2024-07-09 DIAGNOSIS — N390 Urinary tract infection, site not specified: Secondary | ICD-10-CM | POA: Insufficient documentation

## 2024-07-09 DIAGNOSIS — N952 Postmenopausal atrophic vaginitis: Secondary | ICD-10-CM

## 2024-07-09 DIAGNOSIS — N200 Calculus of kidney: Secondary | ICD-10-CM

## 2024-07-09 LAB — URINALYSIS, COMPLETE (UACMP) WITH MICROSCOPIC
Bilirubin Urine: NEGATIVE
Glucose, UA: >=500 mg/dL — AB
Ketones, ur: NEGATIVE mg/dL
Nitrite: NEGATIVE
Protein, ur: NEGATIVE mg/dL
Specific Gravity, Urine: <1.005 — ABNORMAL LOW (ref 1.005–1.030)
WBC, UA: >50 WBC/hpf (ref 0–5)
pH: 5.5 (ref 5.0–8.0)

## 2024-07-09 LAB — BLADDER SCAN AMB NON-IMAGING

## 2024-07-09 MED ORDER — CIPROFLOXACIN HCL 250 MG PO TABS: 250.0000 mg | ORAL_TABLET | Freq: Two times a day (BID) | ORAL | 0 refills | Status: AC

## 2024-07-11 ENCOUNTER — Ambulatory Visit: Payer: Self-pay | Admitting: Urology

## 2024-07-11 LAB — URINE CULTURE: Culture: >=100000 — AB

## 2024-07-16 ENCOUNTER — Ambulatory Visit

## 2024-07-17 ENCOUNTER — Other Ambulatory Visit: Payer: Self-pay | Admitting: Internal Medicine

## 2024-07-17 DIAGNOSIS — R300 Dysuria: Secondary | ICD-10-CM

## 2024-07-18 ENCOUNTER — Telehealth: Payer: Self-pay

## 2024-07-18 ENCOUNTER — Other Ambulatory Visit: Payer: Self-pay | Admitting: Internal Medicine

## 2024-07-18 DIAGNOSIS — E118 Type 2 diabetes mellitus with unspecified complications: Secondary | ICD-10-CM

## 2024-07-18 MED ORDER — METFORMIN HCL ER 500 MG PO TB24: 1000.0000 mg | ORAL_TABLET | Freq: Two times a day (BID) | ORAL | 0 refills | Status: DC

## 2024-07-18 NOTE — Progress Notes (Unsigned)
 Date:  07/18/2024   Name:  Samantha Welch   DOB:  Oct 20, 1950   MRN:  [id number]   Chief Complaint: No chief complaint on file.  HPI  Review of Systems   Lab Results  Component Value Date   NA 137 05/29/2024   K 4.3 05/29/2024   CO2 24 05/29/2024   GLUCOSE 296 (H) 05/29/2024   BUN 13 05/29/2024   CREATININE 0.95 05/29/2024   CALCIUM 10.0 05/29/2024   EGFR 62 05/29/2024   GFRNONAA 57 (L) 05/21/2024   Lab Results  Component Value Date   CHOL 167 05/29/2024   HDL 46 05/29/2024   LDLCALC 90 05/29/2024   TRIG 180 (H) 05/29/2024   CHOLHDL 3.6 05/29/2024   Lab Results  Component Value Date   TSH 0.778 05/29/2024   Lab Results  Component Value Date   HGBA1C 9.3 (H) 05/29/2024   Lab Results  Component Value Date   WBC 9.3 05/29/2024   HGB 15.8 05/29/2024   HCT 47.2 (H) 05/29/2024   MCV 96 05/29/2024   PLT 310 05/29/2024   Lab Results  Component Value Date   ALT 17 05/29/2024   AST 9 05/29/2024   ALKPHOS 47 (L) 05/29/2024   BILITOT 0.7 05/29/2024   No results found for: MARIEN BOLLS, VD25OH   Patient Active Problem List   Diagnosis Date Noted   Irritable bowel syndrome with constipation 09/21/2022   Tobacco abuse disorder 03/17/2021   History of kidney stones 12/25/2018   Recurrent UTI 02/10/2016   Major depression single episode, in partial remission 03/31/2015   Hyperlipidemia associated with type 2 diabetes mellitus (HCC) 07/25/2014   Type II diabetes mellitus with complication (HCC) 07/25/2014   GERD (gastroesophageal reflux disease) 04/23/2011    Allergies  Allergen Reactions   Venlafaxine  Nausea Only    Past Surgical History:  Procedure Laterality Date   BREAST BIOPSY Right 08/08/2023   rt br calcs, ribbon marker, path pending   BREAST BIOPSY Right 08/08/2023   MM RT BREAST BX W LOC DEV 1ST LESION IMAGE BX SPEC STEREO GUIDE 08/08/2023 [facility]   COLONOSCOPY WITH PROPOFOL  N/A 10/05/2021   Procedure: COLONOSCOPY WITH  PROPOFOL ;  Surgeon: Onita Elspeth Sharper, DO;  Location: [facility];  Service: Gastroenterology;  Laterality: N/A;   CYSTOSCOPY W/ URETERAL STENT PLACEMENT Left 12/15/2015   Procedure: CYSTOSCOPY WITH STENT REPLACEMENT;  Surgeon: Rosina Riis, MD;  Location: [facility];  Service: Urology;  Laterality: Left;   CYSTOSCOPY WITH STENT PLACEMENT Left 11/16/2015   Procedure: CYSTOSCOPY WITH LEFT STENT PLACEMENT, LEFT RETROGRADE;  Surgeon: Zina JINNY Irwin, MD;  Location: [facility];  Service: Urology;  Laterality: Left;   FOOT SURGERY Bilateral    KNEE ARTHROSCOPY WITH MEDIAL MENISECTOMY Left 03/20/2020   Procedure: knee arthroscopic partial medial AND lateral meniscectomy;  Surgeon: Tobie Priest, MD;  Location: [facility];  Service: Orthopedics;  Laterality: Left;  Diabetic - oral meds   TUBAL LIGATION     URETEROSCOPY WITH HOLMIUM LASER LITHOTRIPSY Left 12/15/2015   Procedure: URETEROSCOPY WITH HOLMIUM LASER LITHOTRIPSY;  Surgeon: Rosina Riis, MD;  Location: [facility];  Service: Urology;  Laterality: Left;    Social History   Tobacco Use   Smoking status: Every Day    Current packs/day: 0.50    Average packs/day: 0.5 packs/day for 45.8 years (22.9 ttl pk-yrs)    Types: Cigarettes    Start date: 1980    Passive exposure: Current   Smokeless tobacco: Never  Vaping Use  Vaping status: Never Used  Substance Use Topics   Alcohol use: No    Alcohol/week: 0.0 standard drinks of alcohol   Drug use: No     Medication list has been reviewed and updated.  No outpatient medications have been marked as taking for the 07/18/24 encounter (Orders Only) with Justus Leita DEL, MD.       07/02/2024    6:17 PM 05/29/2024    1:16 PM 01/24/2024    2:58 PM 09/26/2023    2:49 PM  GAD 7 : Generalized Anxiety Score  Nervous, Anxious, on Edge  3 3 2   Control/stop worrying  3 3 2   Worry too much - different things  3 3 2   Trouble relaxing  3 3 0  Restless  2 2 0  Easily annoyed or  irritable  0 0 0  Afraid - awful might happen  0 0 0  Total GAD 7 Score  14 14 6   Anxiety Difficulty  Somewhat difficult Somewhat difficult Not difficult at all     Information is confidential and restricted. Go to Review Flowsheets to unlock data.       07/02/2024    6:17 PM 05/29/2024    1:16 PM 02/01/2024    1:34 PM  Depression screen PHQ 2/9  Decreased Interest  0 0  Down, Depressed, Hopeless  3 3  PHQ - 2 Score  3 3  Altered sleeping  0 0  Tired, decreased energy  3 3  Change in appetite  0 0  Feeling bad or failure about yourself   0 0  Trouble concentrating  0 0  Moving slowly or fidgety/restless  0 0  Suicidal thoughts  0 0  PHQ-9 Score  6 6  Difficult doing work/chores  Somewhat difficult Very difficult     Information is confidential and restricted. Go to Review Flowsheets to unlock data.    BP Readings from Last 3 Encounters:  07/09/24 (!) 153/75  06/11/24 135/75  05/29/24 (!) 120/58    Physical Exam  Wt Readings from Last 3 Encounters:  07/09/24 143 lb (64.9 kg)  06/11/24 143 lb (64.9 kg)  05/29/24 147 lb (66.7 kg)    There were no vitals taken for this visit.  Assessment and Plan:  Problem List Items Addressed This Visit   None   No follow-ups on file.    Leita HILARIO Justus, MD [facility] [place]

## 2024-07-18 NOTE — Telephone Encounter (Signed)
 Copied from CRM [id number]. Topic: Clinical - Medication Question >> Jul 18, 2024  3:06 PM Samantha Welch wrote: Reason for CRM: Pt called in because metformin  is causing her to go to the bathroom very frequently.   Pt would like to know if she has other options available.   Please follow up with pt.

## 2024-07-19 ENCOUNTER — Other Ambulatory Visit: Payer: Self-pay

## 2024-07-19 NOTE — Telephone Encounter (Signed)
 Patient informed. Will discard current metformin  and pick up new Rx.

## 2024-07-19 NOTE — Telephone Encounter (Signed)
 Discontinued 10/25/23.  Requested Prescriptions  Pending Prescriptions Disp Refills   ondansetron  (ZOFRAN ) 4 MG tablet [Pharmacy Med Name: Ondansetron  HCl 4 MG Oral Tablet] 60 tablet 0    Sig: TAKE 1 TABLET BY MOUTH EVERY 8 HOURS AS NEEDED FOR NAUSEA OR VOMITING     Not Delegated - Gastroenterology: Antiemetics - ondansetron  Failed - 07/19/2024  1:49 PM      Failed - This refill cannot be delegated      Passed - AST in normal range and within 360 days    AST  Date Value Ref Range Status  05/29/2024 9 0 - 40 IU/L Final         Passed - ALT in normal range and within 360 days    ALT  Date Value Ref Range Status  05/29/2024 17 0 - 32 IU/L Final         Passed - Valid encounter within last 6 months    Recent Outpatient Visits           1 month ago Annual physical exam   [place] [facility], Leita DEL, MD   5 months ago Type II diabetes mellitus with complication [facility])   [place] [facility], Leita DEL, MD

## 2024-07-24 ENCOUNTER — Inpatient Hospital Stay: Admit: 2024-07-24 | Payer: Self-pay

## 2024-07-24 ENCOUNTER — Ambulatory Visit
Admission: RE | Admit: 2024-07-24 | Discharge: 2024-07-24 | Disposition: A | Discharge status: Home / Self Care | Source: Ambulatory Visit | Attending: Internal Medicine | Admitting: Internal Medicine

## 2024-07-24 DIAGNOSIS — Z1231 Encounter for screening mammogram for malignant neoplasm of breast: Secondary | ICD-10-CM | POA: Insufficient documentation

## 2024-08-02 NOTE — Progress Notes (Deleted)
 08/03/2024 9:26 PM   [name] Mar 30, 1951 [id number]  Referring provider: Justus Leita DEL, MD [address] [place],  [place] [address]  Urological history: 1. Nephrolithiasis - stone composition of 3% calcium oxalate dihydrate, 77% calcium oxalate monohydrate and 10% calcium phosphate stone - left URS (2017) - contrast CT (05/2022) - no stones   2. rUTI's -contributing factors of age, GSM, diabetes and constipation -contrast CT (05/2022) - no nidus for infection -cysto (07/2023) mild erythema at bladder neck  -urine cytology (07/2023) - negative  -July 09, 2024 - enterobacter cloacae -May 21, 2024 - citrobacter koseri -February 19, 2024 - citrobacter koseri -October 16, 2023 - enterobacter cloacae -September 26, 2023 - hafnia alvei -vaginal estrogen cream, cranberry tablets and Hiprex    No chief complaint on file.  HPI: Samantha Welch is a 73 y.o. female who presents today for urethral pain and is concerned she is getting another UTI.     Previous records reviewed.   UA ***  Serum creatinine (05/2024) 0.95, eGFR 62  Hemoglobin A1c (05/2024) 9.3   PMH: Past Medical History:  Diagnosis Date   Anxiety    Arthritis    Cancer (HCC)    skin ca on face   Chronic kidney disease    Left kidney stone   COPD (chronic obstructive pulmonary disease) (HCC)    Depression    Diabetes mellitus without complication (HCC)    GERD (gastroesophageal reflux disease)    Hyperlipemia    Stomach ulcer    Tobacco abuse disorder     Surgical History: Past Surgical History:  Procedure Laterality Date   BREAST BIOPSY Right 08/08/2023   rt br calcs, ribbon marker, path pending   BREAST BIOPSY Right 08/08/2023   MM RT BREAST BX W LOC DEV 1ST LESION IMAGE BX SPEC STEREO GUIDE 08/08/2023 [facility]   COLONOSCOPY WITH PROPOFOL  N/A 10/05/2021   Procedure: COLONOSCOPY WITH PROPOFOL ;  Surgeon: Onita Elspeth Sharper, DO;  Location: [facility];  Service:  Gastroenterology;  Laterality: N/A;   CYSTOSCOPY W/ URETERAL STENT PLACEMENT Left 12/15/2015   Procedure: CYSTOSCOPY WITH STENT REPLACEMENT;  Surgeon: Rosina Riis, MD;  Location: [facility];  Service: Urology;  Laterality: Left;   CYSTOSCOPY WITH STENT PLACEMENT Left 11/16/2015   Procedure: CYSTOSCOPY WITH LEFT STENT PLACEMENT, LEFT RETROGRADE;  Surgeon: Zina JINNY Irwin, MD;  Location: [facility];  Service: Urology;  Laterality: Left;   FOOT SURGERY Bilateral    KNEE ARTHROSCOPY WITH MEDIAL MENISECTOMY Left 03/20/2020   Procedure: knee arthroscopic partial medial AND lateral meniscectomy;  Surgeon: Tobie Priest, MD;  Location: [facility];  Service: Orthopedics;  Laterality: Left;  Diabetic - oral meds   TUBAL LIGATION     URETEROSCOPY WITH HOLMIUM LASER LITHOTRIPSY Left 12/15/2015   Procedure: URETEROSCOPY WITH HOLMIUM LASER LITHOTRIPSY;  Surgeon: Rosina Riis, MD;  Location: [facility];  Service: Urology;  Laterality: Left;    Home Medications:  Allergies as of 08/03/2024       Reactions   Venlafaxine  Nausea Only        Medication List        Accurate as of August 02, 2024  9:26 PM. If you have any questions, ask your nurse or doctor.          aspirin  81 MG chewable tablet Chew 81 mg by mouth daily.   busPIRone  5 MG tablet Commonly known as: BUSPAR  Take 1 tablet (5 mg total) by mouth 2 (two) times daily.   clonazePAM   1 MG tablet Commonly known as: KLONOPIN  Take 1 tablet (1 mg total) by mouth 2 (two) times daily as needed for anxiety.   clonazePAM  1 MG tablet Commonly known as: KLONOPIN  Take 1 tablet (1 mg total) by mouth 2 (two) times daily as needed for anxiety.   dapagliflozin  propanediol 10 MG Tabs tablet Commonly known as: FARXIGA  Take 1 tablet (10 mg total) by mouth daily.   dicyclomine 10 MG capsule Commonly known as: BENTYL Take 10 mg by mouth 4 (four) times daily -  before meals and at bedtime.   gabapentin 300 MG capsule Commonly  known as: NEURONTIN Take 300 mg by mouth at bedtime as needed.   glucose blood test strip   meclizine  12.5 MG tablet Commonly known as: ANTIVERT  Take 1 tablet (12.5 mg total) by mouth 3 (three) times daily as needed for dizziness.   metFORMIN  500 MG 24 hr tablet Commonly known as: GLUCOPHAGE -XR Take 2 tablets (1,000 mg total) by mouth 2 (two) times daily with a meal.   nystatin  ointment Commonly known as: MYCOSTATIN  Apply 1 Application topically 2 (two) times daily.   ondansetron  4 MG disintegrating tablet Commonly known as: ZOFRAN -ODT Take 1 tablet (4 mg total) by mouth every 8 (eight) hours as needed for nausea or vomiting.   pantoprazole  40 MG tablet Commonly known as: PROTONIX  TAKE 1 TABLET BY MOUTH AT  BEDTIME   pravastatin  20 MG tablet Commonly known as: PRAVACHOL  Take 1 tablet (20 mg total) by mouth at bedtime.   Premarin  vaginal cream Generic drug: conjugated estrogens  Place 1 Applicatorful vaginally daily. Apply 0.5mg  (pea-sized amount)  just inside the vaginal introitus with a finger-tip on  Monday, Wednesday and Friday nights.   traZODone 50 MG tablet Commonly known as: DESYREL Take 50-100 mg by mouth at bedtime.        Allergies:  Allergies  Allergen Reactions   Venlafaxine  Nausea Only    Family History: Family History  Problem Relation Age of Onset   Heart disease Mother    Diabetes Mother    Emphysema Father    Anxiety disorder Sister    Colon cancer Brother    Bladder Cancer Neg Hx    Kidney cancer Neg Hx    Prostate cancer Neg Hx    Breast cancer Neg Hx     Social History:  reports that she has been smoking cigarettes. She started smoking about 45 years ago. She has a 22.9 pack-year smoking history. She has been exposed to tobacco smoke. She has never used smokeless tobacco. She reports that she does not drink alcohol and does not use drugs.  ROS: Pertinent ROS in HPI  Physical Exam: There were no vitals taken for this visit.   Constitutional:  Well nourished. Alert and oriented, No acute distress. HEENT: [place] AT, moist mucus membranes.  Trachea midline, no masses. Cardiovascular: No clubbing, cyanosis, or edema. Respiratory: Normal respiratory effort, no increased work of breathing. GU: No CVA tenderness.  No bladder fullness or masses.  Recession of labia minora, dry, pale vulvar vaginal mucosa and loss of mucosal ridges and folds.  Normal urethral meatus, no lesions, no prolapse, no discharge.   No urethral masses, tenderness and/or tenderness. No bladder fullness, tenderness or masses. *** vagina mucosa, *** estrogen effect, no discharge, no lesions, *** pelvic support, *** cystocele and *** rectocele noted.  No cervical motion tenderness.  Uterus is freely mobile and non-fixed.  No adnexal/parametria masses or tenderness noted.  Anus and perineum are without rashes  or lesions.   ***  Neurologic: Grossly intact, no focal deficits, moving all 4 extremities. Psychiatric: Normal mood and affect.    Laboratory Data: See HPI and EPIC I have reviewed the labs.   Pertinent Imaging: N/A    Assessment & Plan:    1. rUTI's - criteria for recurrent UTI has been met with 2 or more infections in 6 months or 3 or greater infections in one year *** - vaginal estrogen cream is first-line therapy for postmenopausal women *** - patient is instructed to increase their water intake until the urine is pale yellow or clear (10 to 12 cups daily) *** - taking probiotics that include  Lactobacillus crispatus in premenopausal women and oral capsules with Lactobacillus rhamnosus GR-1 and Lactobacillus reuteri RC-14 in postmenopausal women *** - Hiprex  1 gram twice daily if creatinine clearance is > 30 *** - could consider D-mannose or cranberry products, but evidence is weak with contradictory findings *** - address constipation issues ***                                             2. Nephrolithiasis - recent RUS did not show any  stones  3. GSM - Continue applying vaginal estrogen cream 3 nights weekly  No follow-ups on file.  These notes generated with voice recognition software. I apologize for typographical errors.  CLOTILDA HELON RIGGERS  [facility] [address]  [address] [place], [place] [address] [phone]

## 2024-08-02 NOTE — Progress Notes (Signed)
 Large Joint Injection  Date/Time: 08/02/2024 9:30 AM  Performed by: Tobie Earnestine Clamp, MD Authorized by: Tobie Earnestine Clamp, MD   Needle Size:  22 G Location:  Knee Topical skin anesthesia: obtained using ethyl chloride spray   Medications:  40 mg triamcinolone acetonide 40 mg/mL; 2 mL lidocaine  1 %; 2 mL BUPivacaine  HCl 0.5 %

## 2024-08-03 ENCOUNTER — Ambulatory Visit: Admitting: Urology

## 2024-08-03 DIAGNOSIS — N390 Urinary tract infection, site not specified: Secondary | ICD-10-CM

## 2024-08-03 DIAGNOSIS — N958 Other specified menopausal and perimenopausal disorders: Secondary | ICD-10-CM

## 2024-08-03 DIAGNOSIS — N200 Calculus of kidney: Secondary | ICD-10-CM

## 2024-08-04 ENCOUNTER — Other Ambulatory Visit: Payer: Self-pay | Admitting: Internal Medicine

## 2024-08-04 DIAGNOSIS — K589 Irritable bowel syndrome without diarrhea: Secondary | ICD-10-CM

## 2024-08-06 ENCOUNTER — Other Ambulatory Visit: Payer: Self-pay | Admitting: Internal Medicine

## 2024-08-06 DIAGNOSIS — E118 Type 2 diabetes mellitus with unspecified complications: Secondary | ICD-10-CM

## 2024-08-06 NOTE — Telephone Encounter (Signed)
 Too soon for refill, LRF 05/18/24 FOR 90 DAYS AND 1 RF.  Requested Prescriptions  Pending Prescriptions Disp Refills   pantoprazole  (PROTONIX ) 40 MG tablet [Pharmacy Med Name: Pantoprazole  Sodium 40 MG Oral Tablet Delayed Release] 100 tablet 2    Sig: TAKE 1 TABLET BY MOUTH AT  BEDTIME     Gastroenterology: Proton Pump Inhibitors Passed - 08/06/2024  3:46 PM      Passed - Valid encounter within last 12 months    Recent Outpatient Visits           2 months ago Annual physical exam   [place] [facility] at [facility], Leita DEL, MD   6 months ago Type II diabetes mellitus with complication [facility])   [place] [facility] at [facility], Leita DEL, MD       Future Appointments             In 2 days [place], Clotilda DELENA RIGGERS [facility] [place]

## 2024-08-07 NOTE — Telephone Encounter (Signed)
 No longer listed on current medication list at this dosing Requested Prescriptions  Pending Prescriptions Disp Refills   metFORMIN  (GLUCOPHAGE ) 1000 MG tablet [Pharmacy Med Name: metFORMIN  HCl 1000 MG Oral Tablet] 150 tablet 2    Sig: TAKE 1 TABLET BY MOUTH IN THE  MORNING AND ONE-HALF TABLET BY  MOUTH IN THE EVENING     Endocrinology:  Diabetes - Biguanides Failed - 08/07/2024 10:20 AM      Failed - HBA1C is between 0 and 7.9 and within 180 days    Hemoglobin A1C  Date Value Ref Range Status  05/04/2022 7.0  Final   Hgb A1c MFr Bld  Date Value Ref Range Status  05/29/2024 9.3 (H) 4.8 - 5.6 % Final    Comment:             Prediabetes: 5.7 - 6.4          Diabetes: >6.4          Glycemic control for adults with diabetes: <7.0          Failed - B12 Level in normal range and within 720 days    No results found for: VITAMINB12       Passed - Cr in normal range and within 360 days    Creatinine, Ser  Date Value Ref Range Status  05/29/2024 0.95 0.57 - 1.00 mg/dL Final   Creatinine, Urine  Date Value Ref Range Status  01/01/2022 67  Final         Passed - eGFR in normal range and within 360 days    GFR calc Af Amer  Date Value Ref Range Status  12/02/2015 >60 >60 mL/min Final    Comment:    (NOTE) The eGFR has been calculated using the CKD EPI equation. This calculation has not been validated in all clinical situations. eGFR's persistently <60 mL/min signify possible Chronic Kidney Disease.    GFR, Estimated  Date Value Ref Range Status  05/21/2024 57 (L) >60 mL/min Final    Comment:    (NOTE) Calculated using the CKD-EPI Creatinine Equation (2021)    eGFR  Date Value Ref Range Status  05/29/2024 62 >59 mL/min/1.73 Final         Passed - Valid encounter within last 6 months    Recent Outpatient Visits           2 months ago Annual physical exam   [place] [facility] at [facility], Leita DEL, MD   6 months ago Type II  diabetes mellitus with complication [facility])   [place] [facility] at [facility], Leita DEL, MD       Future Appointments             Tomorrow [place] Clotilda LABOR, PA-C [facility] [place]            Passed - CBC within normal limits and completed in the last 12 months    WBC  Date Value Ref Range Status  05/29/2024 9.3 3.4 - 10.8 x10E3/uL Final  05/19/2022 13.4 (H) 4.0 - 10.5 K/uL Final   RBC  Date Value Ref Range Status  05/29/2024 4.92 3.77 - 5.28 x10E6/uL Final  05/19/2022 5.01 3.87 - 5.11 MIL/uL Final   Hemoglobin  Date Value Ref Range Status  05/29/2024 15.8 11.1 - 15.9 g/dL Final   Hematocrit  Date Value Ref Range Status  05/29/2024 47.2 (H) 34.0 - 46.6 % Final   MCHC  Date Value Ref Range Status  05/29/2024 33.5 31.5 - 35.7 g/dL Final  90/93/7976 65.3 30.0 - 36.0 g/dL Final   [facility]  Date Value Ref Range Status  05/29/2024 32.1 26.6 - 33.0 pg Final  05/19/2022 31.5 26.0 - 34.0 pg Final   MCV  Date Value Ref Range Status  05/29/2024 96 79 - 97 fL Final   No results found for: PLTCOUNTKUC, LABPLAT, POCPLA RDW  Date Value Ref Range Status  05/29/2024 12.4 11.7 - 15.4 % Final

## 2024-08-08 ENCOUNTER — Ambulatory Visit: Admitting: Urology
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
## 2024-08-20 ENCOUNTER — Ambulatory Visit (INDEPENDENT_AMBULATORY_CARE_PROVIDER_SITE_OTHER): Admitting: Psychiatry

## 2024-08-20 ENCOUNTER — Other Ambulatory Visit: Payer: Self-pay

## 2024-08-20 ENCOUNTER — Encounter: Payer: Self-pay | Admitting: Psychiatry

## 2024-08-20 VITALS — BP 106/82 | HR 70 | Temp 97.3°F | Ht 62.0 in | Wt 141.0 lb

## 2024-08-20 DIAGNOSIS — F331 Major depressive disorder, recurrent, moderate: Secondary | ICD-10-CM | POA: Diagnosis not present

## 2024-08-20 DIAGNOSIS — F411 Generalized anxiety disorder: Secondary | ICD-10-CM | POA: Diagnosis not present

## 2024-08-20 DIAGNOSIS — G4700 Insomnia, unspecified: Secondary | ICD-10-CM | POA: Diagnosis not present

## 2024-08-20 MED ORDER — CLONAZEPAM 1 MG PO TABS: 1.0000 mg | ORAL_TABLET | Freq: Two times a day (BID) | ORAL | 1 refills | Status: DC | PRN

## 2024-08-20 MED ORDER — BUSPIRONE HCL 10 MG PO TABS: 10.0000 mg | ORAL_TABLET | Freq: Two times a day (BID) | ORAL | 1 refills | Status: DC

## 2024-08-20 NOTE — Patient Instructions (Signed)
 Increase Buspar  10 mg twice a day Continue trazodone 50 mg at night as needed for insomnia Continue clonazepam  1 mg twice a day as needed for anxiety  Next appointment- 1/29 at 9 AM

## 2024-08-21 ENCOUNTER — Other Ambulatory Visit: Payer: Self-pay

## 2024-08-21 ENCOUNTER — Telehealth: Payer: Self-pay

## 2024-08-21 DIAGNOSIS — E118 Type 2 diabetes mellitus with unspecified complications: Secondary | ICD-10-CM

## 2024-08-21 DIAGNOSIS — E1169 Type 2 diabetes mellitus with other specified complication: Secondary | ICD-10-CM

## 2024-08-21 MED ORDER — PRAVASTATIN SODIUM 20 MG PO TABS: 20.0000 mg | ORAL_TABLET | Freq: Every day | ORAL | 0 refills | Status: AC

## 2024-08-21 MED ORDER — METFORMIN HCL ER 500 MG PO TB24: 1000.0000 mg | ORAL_TABLET | Freq: Two times a day (BID) | ORAL | 0 refills | Status: AC

## 2024-08-21 NOTE — Telephone Encounter (Signed)
 Copied from CRM #[id number]. Topic: Clinical - Prescription Issue >> Aug 20, 2024  4:07 PM Sophia H wrote: Reason for CRM: Patient states pharmacy is needing updated RX's for future, recently filled but new pharmacy :  metFORMIN  (GLUCOPHAGE -XR) 500 MG 24 hr tablet  pravastatin  (PRAVACHOL ) 20 MG tablet   [facility]) [place], [place] - [address]

## 2024-08-21 NOTE — Telephone Encounter (Signed)
 Refill sent

## 2024-08-22 ENCOUNTER — Ambulatory Visit
Admission: EM | Admit: 2024-08-22 | Discharge: 2024-08-22 | Disposition: A | Discharge status: Home / Self Care | Attending: Emergency Medicine | Admitting: Emergency Medicine

## 2024-08-22 ENCOUNTER — Encounter: Payer: Self-pay | Admitting: Emergency Medicine

## 2024-08-22 DIAGNOSIS — E1165 Type 2 diabetes mellitus with hyperglycemia: Secondary | ICD-10-CM | POA: Diagnosis not present

## 2024-08-22 DIAGNOSIS — N390 Urinary tract infection, site not specified: Secondary | ICD-10-CM | POA: Insufficient documentation

## 2024-08-22 DIAGNOSIS — E118 Type 2 diabetes mellitus with unspecified complications: Secondary | ICD-10-CM

## 2024-08-22 DIAGNOSIS — R739 Hyperglycemia, unspecified: Secondary | ICD-10-CM

## 2024-08-22 LAB — POCT URINE DIPSTICK
Bilirubin, UA: NEGATIVE
Glucose, UA: >=1000 mg/dL — AB
Ketones, POC UA: NEGATIVE mg/dL
Nitrite, UA: NEGATIVE
Protein Ur, POC: NEGATIVE mg/dL
Spec Grav, UA: 1.01 (ref 1.010–1.025)
Urobilinogen, UA: 0.2 U/dL
pH, UA: 5.5 (ref 5.0–8.0)

## 2024-08-22 LAB — GLUCOSE, POCT (MANUAL RESULT ENTRY): POCT Glucose (KUC): 346 mg/dL — AB (ref 70–99)

## 2024-08-22 MED ORDER — CEFUROXIME AXETIL 500 MG PO TABS: 500.0000 mg | ORAL_TABLET | Freq: Two times a day (BID) | ORAL | 0 refills | Status: AC

## 2024-08-22 NOTE — ED Triage Notes (Signed)
 Pt presents with back pain, dysuria and vaginal pain x 3-4 days.

## 2024-08-22 NOTE — ED Provider Notes (Signed)
 [facility]    CSN: [id number] Arrival date & time: 08/22/24  1207      History   Chief Complaint Chief Complaint  Patient presents with   Back Pain   Dysuria    HPI Samantha Welch is a 73 y.o. female.   73 year old female, Samantha Welch, presents to urgent care for evaluation of back pain,dysuria and vaginal pain x 3 days. Pt reports she has diabetes and is controlling it with metformin  and farxiga , pt does not check BS regularly, last checked a few weeks ago it was in 200's. Pt endorses eating oranges.  Pt denies saddle numbness, loss of bowel and bladder or loss of function.  Patient reports she has a history of UTIs  PMH: CKD, recurrent Uti, COPD, DM, anxiety  The history is provided by the patient. No language interpreter was used.    Past Medical History:  Diagnosis Date   Anxiety    Arthritis    Cancer (HCC)    skin ca on face   Chronic kidney disease    Left kidney stone   COPD (chronic obstructive pulmonary disease) (HCC)    Depression    Diabetes mellitus without complication (HCC)    GERD (gastroesophageal reflux disease)    Hyperlipemia    Stomach ulcer    Tobacco abuse disorder     Patient Active Problem List   Diagnosis Date Noted   Hyperglycemia 08/22/2024   Irritable bowel syndrome with constipation 09/21/2022   Tobacco abuse disorder 03/17/2021   History of kidney stones 12/25/2018   Acute UTI 02/10/2016   Major depression single episode, in partial remission 03/31/2015   Hyperlipidemia associated with type 2 diabetes mellitus (HCC) 07/25/2014   Type II diabetes mellitus with complication (HCC) 07/25/2014   GERD (gastroesophageal reflux disease) 04/23/2011    Past Surgical History:  Procedure Laterality Date   BREAST BIOPSY Right 08/08/2023   rt br calcs, ribbon marker, path pending   BREAST BIOPSY Right 08/08/2023   MM RT BREAST BX W LOC DEV 1ST LESION IMAGE BX SPEC STEREO GUIDE 08/08/2023 [facility]   COLONOSCOPY WITH  PROPOFOL  N/A 10/05/2021   Procedure: COLONOSCOPY WITH PROPOFOL ;  Surgeon: Onita Elspeth Sharper, DO;  Location: [facility] ENDOSCOPY;  Service: Gastroenterology;  Laterality: N/A;   CYSTOSCOPY W/ URETERAL STENT PLACEMENT Left 12/15/2015   Procedure: CYSTOSCOPY WITH STENT REPLACEMENT;  Surgeon: Rosina Riis, MD;  Location: [facility];  Service: Urology;  Laterality: Left;   CYSTOSCOPY WITH STENT PLACEMENT Left 11/16/2015   Procedure: CYSTOSCOPY WITH LEFT STENT PLACEMENT, LEFT RETROGRADE;  Surgeon: Zina JINNY Irwin, MD;  Location: [facility];  Service: Urology;  Laterality: Left;   FOOT SURGERY Bilateral    KNEE ARTHROSCOPY WITH MEDIAL MENISECTOMY Left 03/20/2020   Procedure: knee arthroscopic partial medial AND lateral meniscectomy;  Surgeon: Tobie Priest, MD;  Location: [facility];  Service: Orthopedics;  Laterality: Left;  Diabetic - oral meds   TUBAL LIGATION     URETEROSCOPY WITH HOLMIUM LASER LITHOTRIPSY Left 12/15/2015   Procedure: URETEROSCOPY WITH HOLMIUM LASER LITHOTRIPSY;  Surgeon: Rosina Riis, MD;  Location: [facility];  Service: Urology;  Laterality: Left;    OB History     Gravida  2   Para  2   Term  2   Preterm  0   AB  0   Living  0      SAB  0   IAB  0   Ectopic  0   Multiple  0  Live Births               Home Medications    Prior to Admission medications   Medication Sig Start Date End Date Taking? Authorizing Provider  cefUROXime (CEFTIN) 500 MG tablet Take 1 tablet (500 mg total) by mouth 2 (two) times daily with a meal for 7 days. 08/22/24 08/29/24 Yes Abella Shugart, Rilla, NP  aspirin  81 MG chewable tablet Chew 81 mg by mouth daily.    [provider]  busPIRone  (BUSPAR ) 10 MG tablet Take 1 tablet (10 mg total) by mouth 2 (two) times daily. 08/20/24 10/19/24  Vickey Mettle, MD  clonazePAM  (KLONOPIN ) 1 MG tablet Take 1 tablet (1 mg total) by mouth 2 (two) times daily as needed for anxiety. 06/12/24   Justus Leita DEL, MD   clonazePAM  (KLONOPIN ) 1 MG tablet Take 1 tablet (1 mg total) by mouth 2 (two) times daily as needed for anxiety. 09/10/24 11/09/24  Vickey Mettle, MD  conjugated estrogens  (PREMARIN ) vaginal cream Place 1 Applicatorful vaginally daily. Apply 0.5mg  (pea-sized amount)  just inside the vaginal introitus with a finger-tip on  Monday, Wednesday and Friday nights. 10/31/23   Helon Kirsch A, PA-C  dapagliflozin  propanediol (FARXIGA ) 10 MG TABS tablet Take 1 tablet (10 mg total) by mouth daily. 05/30/24   Justus Leita DEL, MD  dicyclomine (BENTYL) 10 MG capsule Take 10 mg by mouth 4 (four) times daily -  before meals and at bedtime.    [provider]  gabapentin (NEURONTIN) 300 MG capsule Take 300 mg by mouth at bedtime as needed. 05/06/23   [provider]  glucose blood test strip     [provider]  meclizine  (ANTIVERT ) 12.5 MG tablet Take 1 tablet (12.5 mg total) by mouth 3 (three) times daily as needed for dizziness. 05/25/23   Justus Leita DEL, MD  metFORMIN  (GLUCOPHAGE -XR) 500 MG 24 hr tablet Take 2 tablets (1,000 mg total) by mouth 2 (two) times daily with a meal. 08/21/24   Justus Leita DEL, MD  nystatin  ointment (MYCOSTATIN ) Apply 1 Application topically 2 (two) times daily. 01/08/24   Arvis Jolan NOVAK, PA-C  ondansetron  (ZOFRAN -ODT) 4 MG disintegrating tablet Take 1 tablet (4 mg total) by mouth every 8 (eight) hours as needed for nausea or vomiting. 06/12/24   Justus Leita DEL, MD  pantoprazole  (PROTONIX ) 40 MG tablet TAKE 1 TABLET BY MOUTH AT  BEDTIME 05/18/24   Justus Leita DEL, MD  pravastatin  (PRAVACHOL ) 20 MG tablet Take 1 tablet (20 mg total) by mouth at bedtime. 08/21/24   Justus Leita DEL, MD  traZODone (DESYREL) 50 MG tablet Take 50-100 mg by mouth at bedtime.    [provider]    Family History Family History  Problem Relation Age of Onset   Heart disease Mother    Diabetes Mother    Emphysema Father    Anxiety disorder Sister    Colon cancer  Brother    Bladder Cancer Neg Hx    Kidney cancer Neg Hx    Prostate cancer Neg Hx    Breast cancer Neg Hx     Social History Social History   Tobacco Use   Smoking status: Every Day    Current packs/day: 0.50    Average packs/day: 0.5 packs/day for 45.9 years (23.0 ttl pk-yrs)    Types: Cigarettes    Start date: 1980    Passive exposure: Current   Smokeless tobacco: Never  Vaping Use   Vaping status: Never Used  Substance Use Topics   Alcohol use: No    Alcohol/week: 0.0 standard drinks of alcohol   Drug use: No     Allergies   Venlafaxine    Review of Systems Review of Systems  Constitutional:  Negative for fever.  Genitourinary:  Positive for dysuria and vaginal pain.  Musculoskeletal:  Positive for back pain.  All other systems reviewed and are negative.    Physical Exam Triage Vital Signs ED Triage Vitals  Encounter Vitals Group     BP 08/22/24 1252 (!) 154/72     Girls Systolic BP Percentile --      Girls Diastolic BP Percentile --      Boys Systolic BP Percentile --      Boys Diastolic BP Percentile --      Pulse Rate 08/22/24 1252 66     Resp 08/22/24 1252 15     Temp 08/22/24 1252 98.1 F (36.7 C)     Temp Source 08/22/24 1252 Oral     SpO2 08/22/24 1252 95 %     Weight 08/22/24 1250 141 lb (64 kg)     Height --      Head Circumference --      Peak Flow --      Pain Score 08/22/24 1251 9     Pain Loc --      Pain Education --      Exclude from Growth Chart --    No data found.  Updated Vital Signs BP (!) 154/72 (BP Location: Left Arm)   Pulse 66   Temp 98.1 F (36.7 C) (Oral)   Resp 15   Wt 141 lb (64 kg)   SpO2 95%   BMI 25.79 kg/m   Visual Acuity Right Eye Distance:   Left Eye Distance:   Bilateral Distance:    Right Eye Near:   Left Eye Near:    Bilateral Near:     Physical Exam Vitals and nursing note reviewed.  Constitutional:      General: She is not in acute distress.    Appearance: She is well-developed.  HENT:      Head: Normocephalic and atraumatic.  Eyes:     Conjunctiva/sclera: Conjunctivae normal.  Cardiovascular:     Rate and Rhythm: Normal rate.     Heart sounds: No murmur heard. Pulmonary:     Effort: Pulmonary effort is normal. No respiratory distress.  Abdominal:     Palpations: Abdomen is soft.     Tenderness: There is no abdominal tenderness.  Musculoskeletal:        General: No swelling.     Cervical back: Neck supple.     Lumbar back: Tenderness present. No swelling, edema, deformity, signs of trauma, lacerations, spasms or bony tenderness. Normal range of motion. Negative right straight leg raise test and negative left straight leg raise test. No scoliosis.     Comments: Pt points to bilateral lumbar paraspinal region as area of back pain  Skin:    General: Skin is warm and dry.     Capillary Refill: Capillary refill takes less than 2 seconds.  Neurological:     General: No focal deficit present.     Mental Status: She is alert and oriented to person, place, and time.     GCS: GCS eye subscore is 4. GCS verbal subscore is 5. GCS motor subscore is 6.  Psychiatric:        Attention and Perception: Attention normal.  Mood and Affect: Mood normal.        Speech: Speech normal.        Behavior: Behavior normal. Behavior is cooperative.      UC Treatments / Results  Labs (all labs ordered are listed, but only abnormal results are displayed) Labs Reviewed  POCT URINE DIPSTICK - Abnormal; Notable for the following components:      Result Value   Color, UA light yellow (*)    Clarity, UA cloudy (*)    Glucose, UA >=1,000 (*)    Blood, UA moderate (*)    Leukocytes, UA Small (1+) (*)    All other components within normal limits  GLUCOSE, POCT (MANUAL RESULT ENTRY) - Abnormal; Notable for the following components:   POCT Glucose ([facility]) 346 (*)    All other components within normal limits    EKG   Radiology No results found.  Procedures Procedures (including  critical care time)  Medications Ordered in UC Medications - No data to display  Initial Impression / Assessment and Plan / UC Course  I have reviewed the triage vital signs and the nursing notes.  Pertinent labs & imaging results that were available during my care of the patient were reviewed by me and considered in my medical decision making (see chart for details).  Clinical Course as of 08/22/24 2052  Wed Aug 22, 2024  1317 UA: +Small leuk, + blood, glucose>1000, UC pending. [JD]  1321 Reviewed labs 05/29/24 Creatinine 0.95, egfr 62: per microdex for Renal impairment, CrCl 30 mL/min or greater: No adjustment needed   [JD]    Clinical Course User Index [JD] Omya Winfield, Rilla, NP   Discussed exam findings and plan of care with patient , take ceftin as directed complicated UTI,  urine culture pending. Drink plenty of water your BS is 346 in office, do not eat concentrated sweets, breads, soft drinks, oranges,etc. May take tylenol  for pain as label directed Frequent UTI's may be a side effect of farxiga  Please call PCP for follow up- may need referral to endocrinology to further assist with management of  diabetes If you have new or worsening issues(fever, nausea,vomiting, muscle aches,worsening issues,etc) go to Er for further evaluation.  Patient verbalized understanding to this provider.  Ddx: Acute UTI, hyperglycemia, diabetes Final Clinical Impressions(s) / UC Diagnoses   Final diagnoses:  Type II diabetes mellitus with complication (HCC)  Hyperglycemia  Acute UTI     Discharge Instructions      Take ceftin as directed, urine culture pending. Drink plenty of water your BS is 346 in office, do not eat concentrated sweets, breads, soft drinks, oranges,etc. May take tylenol  for pain as label directed Frequent UTI's may be a side effect of farxiga  Please call PCP for follow up- may need referral to endocrinology to further assist with management of  diabetes If you have  new or worsening issues(fever, nausea,vomiting, muscle aches,worsening issues,etc) go to Er for further evaluation     ED Prescriptions     Medication Sig Dispense Auth. Provider   cefUROXime (CEFTIN) 500 MG tablet Take 1 tablet (500 mg total) by mouth 2 (two) times daily with a meal for 7 days. 14 tablet Trace Cederberg, Rilla, NP      PDMP not reviewed this encounter.   Aminta Rilla, NP 08/22/24 2052

## 2024-08-22 NOTE — Discharge Instructions (Addendum)
 Take ceftin as directed, urine culture pending. Drink plenty of water your BS is 346 in office, do not eat concentrated sweets, breads, soft drinks, oranges,etc. May take tylenol  for pain as label directed Frequent UTI's may be a side effect of farxiga  Please call PCP for follow up- may need referral to endocrinology to further assist with management of  diabetes If you have new or worsening issues(fever, nausea,vomiting, muscle aches,worsening issues,etc) go to Er for further evaluation

## 2024-08-23 ENCOUNTER — Other Ambulatory Visit: Payer: Self-pay | Admitting: Psychiatry

## 2024-08-29 ENCOUNTER — Ambulatory Visit: Payer: Self-pay

## 2024-08-29 NOTE — Telephone Encounter (Signed)
 FYI Only or Action Required?: FYI only for provider: appointment scheduled on 08/30/24.  Patient was last seen in primary care on 05/29/2024 by Justus Leita DEL, MD.  Called Nurse Triage reporting Pelvic Pain.  Symptoms began a week ago.  Interventions attempted: Prescription medications: anti-emetic and Rest, hydration, or home remedies.  Symptoms are: unchanged.  Triage Disposition: Duplicate Contact Calls  Patient/caregiver understands and will follow disposition?:    Copied from CRM #[id number]. Topic: Clinical - Red Word Triage >> Aug 29, 2024 11:00 AM Winona R wrote: Pain in pelvic, and stomach. Pt went Urgent care 12/10 and was told she has a kidney infection. Pt completed medication however it is still very painful. Reason for Disposition  Caller has already spoken with another triager and has no further questions.  Answer Assessment - Initial Assessment Questions Returned pt's call to f/u on symptoms and she reported she already spoke to someone and is scheduled with PCP 12/18. Per chart, appt confirmed. Discussed symptom management of increasing fluids, discussed pain management of ibuprofen /tylenol . Pt states she has been a little nauseated so she did take an anti-emetic. Reassured her to keep us  updated if she needs anything further. Pt voiced appreciation for f/u.  Protocols used: No Contact or Duplicate Contact Call-A-AH

## 2024-08-29 NOTE — Telephone Encounter (Signed)
 Noted

## 2024-08-30 ENCOUNTER — Ambulatory Visit (INDEPENDENT_AMBULATORY_CARE_PROVIDER_SITE_OTHER): Admitting: Internal Medicine

## 2024-08-30 ENCOUNTER — Encounter: Payer: Self-pay | Admitting: Internal Medicine

## 2024-08-30 VITALS — BP 132/64 | HR 87 | Temp 98.3°F | Ht 62.0 in | Wt 140.0 lb

## 2024-08-30 DIAGNOSIS — R110 Nausea: Secondary | ICD-10-CM

## 2024-08-30 DIAGNOSIS — J432 Centrilobular emphysema: Secondary | ICD-10-CM | POA: Insufficient documentation

## 2024-08-30 DIAGNOSIS — Z7984 Long term (current) use of oral hypoglycemic drugs: Secondary | ICD-10-CM

## 2024-08-30 DIAGNOSIS — N390 Urinary tract infection, site not specified: Secondary | ICD-10-CM

## 2024-08-30 DIAGNOSIS — E118 Type 2 diabetes mellitus with unspecified complications: Secondary | ICD-10-CM

## 2024-08-30 LAB — POCT URINALYSIS DIPSTICK
Bilirubin, UA: NEGATIVE
Glucose, UA: POSITIVE — AB
Ketones, UA: NEGATIVE
Nitrite, UA: NEGATIVE
Protein, UA: NEGATIVE
Spec Grav, UA: 1.01 (ref 1.010–1.025)
Urobilinogen, UA: 0.2 U/dL
pH, UA: 6 (ref 5.0–8.0)

## 2024-08-30 MED ORDER — SULFAMETHOXAZOLE-TRIMETHOPRIM 800-160 MG PO TABS: 1.0000 | ORAL_TABLET | Freq: Two times a day (BID) | ORAL | 0 refills | Status: AC

## 2024-08-30 MED ORDER — ONDANSETRON 4 MG PO TBDP: 4.0000 mg | ORAL_TABLET | Freq: Three times a day (TID) | ORAL | 0 refills | Status: AC | PRN

## 2024-08-30 NOTE — Progress Notes (Signed)
 Date:  08/30/2024   Name:  Samantha Welch   DOB:  [birth year]   MRN:  [id number]   Chief Complaint: Flank Pain (Went to UC on 12/10, give antibiotic for kidney infection, completed course and does not feel any better, nauseous and chills at night )  Urinary Tract Infection  This is a recurrent problem. The problem occurs every urination. The problem has been unchanged. The quality of the pain is described as burning. The pain is mild. There has been no fever. Associated symptoms include chills, flank pain and nausea. Pertinent negatives include no hematuria, hesitancy or vomiting. Treatments tried: 7 days of Cefuroxime . The treatment provided no relief.    Review of Systems  Constitutional:  Positive for chills. Negative for fatigue and fever.  Respiratory:  Negative for chest tightness and shortness of breath.   Cardiovascular:  Negative for chest pain.  Gastrointestinal:  Positive for nausea. Negative for vomiting.  Genitourinary:  Positive for flank pain. Negative for hematuria and hesitancy.  Neurological:  Negative for dizziness and headaches.     Lab Results  Component Value Date   NA 137 05/29/2024   K 4.3 05/29/2024   CO2 24 05/29/2024   GLUCOSE 296 (H) 05/29/2024   BUN 13 05/29/2024   CREATININE 0.95 05/29/2024   CALCIUM 10.0 05/29/2024   EGFR 62 05/29/2024   GFRNONAA 57 (L) 05/21/2024   Lab Results  Component Value Date   CHOL 167 05/29/2024   HDL 46 05/29/2024   LDLCALC 90 05/29/2024   TRIG 180 (H) 05/29/2024   CHOLHDL 3.6 05/29/2024   Lab Results  Component Value Date   TSH 0.778 05/29/2024   Lab Results  Component Value Date   HGBA1C 9.3 (H) 05/29/2024   Lab Results  Component Value Date   WBC 9.3 05/29/2024   HGB 15.8 05/29/2024   HCT 47.2 (H) 05/29/2024   MCV 96 05/29/2024   PLT 310 05/29/2024   Lab Results  Component Value Date   ALT 17 05/29/2024   AST 9 05/29/2024   ALKPHOS 47 (L) 05/29/2024   BILITOT 0.7 05/29/2024   No results found  for: MARIEN BOLLS, VD25OH   Patient Active Problem List   Diagnosis Date Noted   Hyperglycemia 08/22/2024   Irritable bowel syndrome with constipation 09/21/2022   Tobacco abuse disorder 03/17/2021   History of kidney stones 12/25/2018   Acute UTI 02/10/2016   Major depression single episode, in partial remission 03/31/2015   Hyperlipidemia associated with type 2 diabetes mellitus (HCC) 07/25/2014   Type II diabetes mellitus with complication (HCC) 07/25/2014   GERD (gastroesophageal reflux disease) 04/23/2011    Allergies[1]  Past Surgical History:  Procedure Laterality Date   BREAST BIOPSY Right 08/08/2023   rt br calcs, ribbon marker, path pending   BREAST BIOPSY Right 08/08/2023   MM RT BREAST BX W LOC DEV 1ST LESION IMAGE BX SPEC STEREO GUIDE 08/08/2023 [facility]   COLONOSCOPY WITH PROPOFOL  N/A 10/05/2021   Procedure: COLONOSCOPY WITH PROPOFOL ;  Surgeon: Onita Elspeth Sharper, DO;  Location: [facility] END[facility]COPY;  Service: Gastroenterology;  Laterality: N/A;   CYSTOSCOPY W/ URETERAL STENT PLACEMENT Left 12/15/2015   Procedure: CYSTOSCOPY WITH STENT REPLACEMENT;  Surgeon: Rosina Riis, MD;  Location: [facility];  Service: Urology;  Laterality: Left;   CYSTOSCOPY WITH STENT PLACEMENT Left 11/16/2015   Procedure: CYSTOSCOPY WITH LEFT STENT PLACEMENT, LEFT RETROGRADE;  Surgeon: Zina JINNY Irwin, MD;  Location: [facility];  Service: Urology;  Laterality: Left;   FOOT  SURGERY Bilateral    KNEE ARTHROSCOPY WITH MEDIAL MENISECTOMY Left 03/20/2020   Procedure: knee arthroscopic partial medial AND lateral meniscectomy;  Surgeon: Tobie Priest, MD;  Location: [facility];  Service: Orthopedics;  Laterality: Left;  Diabetic - oral meds   TUBAL LIGATION     URETEROSCOPY WITH HOLMIUM LASER LITHOTRIPSY Left 12/15/2015   Procedure: URETEROSCOPY WITH HOLMIUM LASER LITHOTRIPSY;  Surgeon: Rosina Riis, MD;  Location: [facility];  Service: Urology;  Laterality: Left;     Social History[2]   Medication list has been reviewed and updated.  Active Medications[3]     07/02/2024    6:17 PM 05/29/2024    1:16 PM 01/24/2024    2:58 PM 09/26/2023    2:49 PM  GAD 7 : Generalized Anxiety Score  Nervous, Anxious, on Edge  3 3 2   Control/stop worrying  3 3 2   Worry too much - different things  3 3 2   Trouble relaxing  3 3 0  Restless  2 2 0  Easily annoyed or irritable  0 0 0  Afraid - awful might happen  0 0 0  Total GAD 7 Score  14 14 6   Anxiety Difficulty  Somewhat difficult Somewhat difficult Not difficult at all     Information is confidential and restricted. Go to Review Flowsheets to unlock data.       07/02/2024    6:17 PM 05/29/2024    1:16 PM 02/01/2024    1:34 PM  Depression screen PHQ 2/9  Decreased Interest  0 0  Down, Depressed, Hopeless  3 3  PHQ - 2 Score  3 3  Altered sleeping  0 0  Tired, decreased energy  3 3  Change in appetite  0 0  Feeling bad or failure about yourself   0 0  Trouble concentrating  0 0  Moving slowly or fidgety/restless  0 0  Suicidal thoughts  0 0  PHQ-9 Score  6  6   Difficult doing work/chores  Somewhat difficult Very difficult     Information is confidential and restricted. Go to Review Flowsheets to unlock data.   Data saved with a previous flowsheet row definition    BP Readings from Last 3 Encounters:  08/30/24 132/64  08/22/24 (!) 154/72  07/09/24 (!) 153/75    Physical Exam Vitals and nursing note reviewed.  Constitutional:      Appearance: Samantha Welch is well-developed.  Cardiovascular:     Rate and Rhythm: Normal rate and regular rhythm.     Heart sounds: Normal heart sounds.  Pulmonary:     Effort: Pulmonary effort is normal. No respiratory distress.     Breath sounds: Normal breath sounds.  Abdominal:     General: Bowel sounds are normal.     Palpations: Abdomen is soft.     Tenderness: There is abdominal tenderness in the suprapubic area. There is no right CVA tenderness, left CVA  tenderness, guarding or rebound.    Lab Results  Component Value Date   COLORU yellow 08/30/2024   CLARITYU cloudy 08/30/2024   GLUCOSEUR Positive (A) 08/30/2024   BILIRUBINUR negative 08/30/2024   KETONESU negative 08/30/2024   SPECGRAV 1.010 08/30/2024   RBCUR trace (A) 08/30/2024   PHUR 6.0 08/30/2024   PROTEINUR Negative 08/30/2024   UROBILINOGEN 0.2 08/30/2024   LEUKOCYTESUR Moderate (2+) (A) 08/30/2024     Wt Readings from Last 3 Encounters:  08/30/24 140 lb (63.5 kg)  08/22/24 141 lb (64 kg)  07/09/24 143 lb (  64.9 kg)    BP 132/64   Pulse 87   Temp 98.3 F (36.8 C) (Oral)   Ht 5' 2 (1.575 m)   Wt 140 lb (63.5 kg)   SpO2 97%   BMI 25.61 kg/m   Assessment and Plan:  Problem List Items Addressed This Visit       Unprioritized   Acute UTI - Primary   Relevant Medications   sulfamethoxazole -trimethoprim  (BACTRIM  DS) 800-160 MG tablet   Other Relevant Orders   POCT urinalysis dipstick (Completed)   Urine Culture   Type II diabetes mellitus with complication (HCC) (Chronic)   Due for recheck of A1C next month. Samantha Welch is having recurrent UTIs - may be partially due to glucosuria from Farxiga  Consider stopping Farxiga  and starting a GLP-1 - esp if still not well controlled.      Other Visit Diagnoses       Nausea       Relevant Medications   ondansetron  (ZOFRAN -ODT) 4 MG disintegrating tablet     Long term current use of oral hypoglycemic drug           No follow-ups on file.    Leita HILARIO Adie, MD [facility] [place]           [1]  Allergies Allergen Reactions   Venlafaxine  Nausea Only  [2]  Social History Tobacco Use   Smoking status: Every Day    Current packs/day: 0.50    Average packs/day: 0.5 packs/day for 46.0 years (23.0 ttl pk-yrs)    Types: Cigarettes    Start date: 1980    Passive exposure: Current   Smokeless tobacco: Never  Vaping Use   Vaping status: Never Used  Substance Use  Topics   Alcohol use: No    Alcohol/week: 0.0 standard drinks of alcohol   Drug use: No  [3]  Current Meds  Medication Sig   sulfamethoxazole -trimethoprim  (BACTRIM  DS) 800-160 MG tablet Take 1 tablet by mouth 2 (two) times daily for 7 days.

## 2024-08-30 NOTE — Assessment & Plan Note (Signed)
 Due for recheck of A1C next month. She is having recurrent UTIs - may be partially due to glucosuria from Farxiga  Consider stopping Farxiga  and starting a GLP-1 - esp if still not well controlled.

## 2024-09-02 ENCOUNTER — Ambulatory Visit: Payer: Self-pay | Admitting: Internal Medicine

## 2024-09-02 LAB — URINE CULTURE

## 2024-09-04 ENCOUNTER — Telehealth: Payer: Self-pay

## 2024-09-04 NOTE — Telephone Encounter (Signed)
 Copied from CRM #[id number]. Topic: Clinical - Medical Advice >> Sep 03, 2024  4:25 PM Delon HERO wrote: Reason for CRM: Lowanda calling from the [facility] Department is calling regarding the positive result for Carbapenemase Producing Organisms for the patient? Interested in knowing how this was treated? And When symptoms first began [phone] Terrance M.  [phone]

## 2024-09-04 NOTE — Telephone Encounter (Signed)
 Spoke with Lowanda DEL and confirmed Dr. Berglund sent medication in that is sensitive to organism in urine culture.   JM

## 2024-10-01 ENCOUNTER — Ambulatory Visit: Payer: Self-pay

## 2024-10-01 NOTE — Telephone Encounter (Signed)
 FYI Only or Action Required?: Action required by provider: update on patient condition. Pt requesting cough medication be rx for her.  Patient was last seen in primary care on 08/30/2024 by Justus Leita DEL, MD.  Called Nurse Triage reporting Cough.  Symptoms began 4 days ago.  Interventions attempted: OTC medications: originally was using tylenol  flu, no longer utilizing this medication.  Symptoms are: unchanged.  Triage Disposition: Home Care  Patient/caregiver understands and will follow disposition?: No, wishes to speak with PCP  Reason for Triage:  Pt is sick, ear pain and weak. Has bad bad diarrea  Did want a cough medicine called in to help  Reason for Disposition  Cough with cold symptoms (e.g., runny nose, postnasal drip, throat clearing)  Answer Assessment - Initial Assessment Questions 1. ONSET: When did the cough begin?      About 4 days 2. SEVERITY: How bad is the cough today?     Awful 3. SPUTUM: Describe the color of your sputum (e.g., none, dry cough; clear, white, yellow, green)     white 4. HEMOPTYSIS: Are you coughing up any blood? If Yes, ask: How much? (e.g., flecks, streaks, tablespoons, etc.)     denies 5. DIFFICULTY BREATHING: Are you having difficulty breathing? If Yes, ask: How bad is it? (e.g., mild, moderate, severe)      denies 6. FEVER: Do you have a fever? If Yes, ask: What is your temperature, how was it measured, and when did it start?     Have not checked it 10. OTHER SYMPTOMS: Do you have any other symptoms? (e.g., runny nose, wheezing, chest pain)       Ear pain, weakness, diarrhea  Denies flu/covid vaccines this year, states that she is no longer taking tylenol  cold/flu. Denies taking at home flu/covid test. Pt requesting that cough medication be called in for her. Pt has appt tomorrow for TOC, declines appt for cough at this time.  Protocols used: Cough - Acute Productive-A-AH

## 2024-10-01 NOTE — Telephone Encounter (Signed)
 Please review. Pt is requesting cough medication.  KP

## 2024-10-02 ENCOUNTER — Encounter: Payer: Self-pay | Admitting: Student

## 2024-10-02 ENCOUNTER — Ambulatory Visit (INDEPENDENT_AMBULATORY_CARE_PROVIDER_SITE_OTHER): Admitting: Student

## 2024-10-02 VITALS — BP 130/64 | HR 86 | Temp 98.1°F | Ht 62.0 in | Wt 141.0 lb

## 2024-10-02 DIAGNOSIS — R059 Cough, unspecified: Secondary | ICD-10-CM

## 2024-10-02 DIAGNOSIS — J209 Acute bronchitis, unspecified: Secondary | ICD-10-CM | POA: Diagnosis not present

## 2024-10-02 LAB — POC COVID19/FLU A&B COMBO
Covid Antigen, POC: NEGATIVE
Influenza A Antigen, POC: NEGATIVE
Influenza B Antigen, POC: NEGATIVE

## 2024-10-02 MED ORDER — PROMETHAZINE-DM 6.25-15 MG/5ML PO SYRP: 5.0000 mL | ORAL_SOLUTION | Freq: Four times a day (QID) | ORAL | 0 refills | Status: AC | PRN

## 2024-10-02 NOTE — Progress Notes (Signed)
 "  Established Patient Office Visit  Subjective   Patient ID: Samantha Welch, female    DOB: 1951/08/16  Age: 74 y.o. MRN: [id number]  Chief Complaint  Patient presents with   Cough    Since Friday, has been having a cough, SOB, ear fullness, no fever, patient reports taking cold and flu Nyquil,     Samantha Welch is a 74 y.o. person with medical hx listed below who presents today for cough that started 4 days ago. Associated with nausea, malaise, and diarrhea. Coughing up white sputum. Also notes ear fullness. Is taking Nyquil flue without significant improvement. Denies fever and chills, dyspnea and abdominal pain.  Patient Active Problem List   Diagnosis Date Noted   Hyperglycemia 08/22/2024   Irritable bowel syndrome with constipation 09/21/2022   Tobacco abuse disorder 03/17/2021   History of kidney stones 12/25/2018   Acute UTI 02/10/2016   Major depression single episode, in partial remission 03/31/2015   Hyperlipidemia associated with type 2 diabetes mellitus (HCC) 07/25/2014   Type II diabetes mellitus with complication (HCC) 07/25/2014   GERD (gastroesophageal reflux disease) 04/23/2011      ROS Refer to HPI    Objective:     Outpatient Encounter Medications as of 10/02/2024  Medication Sig   aspirin  81 MG chewable tablet Chew 81 mg by mouth daily.   busPIRone  (BUSPAR ) 10 MG tablet Take 1 tablet (10 mg total) by mouth 2 (two) times daily.   clonazePAM  (KLONOPIN ) 1 MG tablet Take 1 tablet (1 mg total) by mouth 2 (two) times daily as needed for anxiety.   clonazePAM  (KLONOPIN ) 1 MG tablet Take 1 tablet (1 mg total) by mouth 2 (two) times daily as needed for anxiety.   conjugated estrogens  (PREMARIN ) vaginal cream Place 1 Applicatorful vaginally daily. Apply 0.5mg  (pea-sized amount)  just inside the vaginal introitus with a finger-tip on  Monday, Wednesday and Friday nights.   dapagliflozin  propanediol (FARXIGA ) 10 MG TABS tablet Take 1 tablet (10 mg total) by mouth  daily.   dicyclomine (BENTYL) 10 MG capsule Take 10 mg by mouth 4 (four) times daily -  before meals and at bedtime.   gabapentin (NEURONTIN) 300 MG capsule Take 300 mg by mouth at bedtime as needed.   glucose blood test strip    meclizine  (ANTIVERT ) 12.5 MG tablet Take 1 tablet (12.5 mg total) by mouth 3 (three) times daily as needed for dizziness.   metFORMIN  (GLUCOPHAGE -XR) 500 MG 24 hr tablet Take 2 tablets (1,000 mg total) by mouth 2 (two) times daily with a meal.   nystatin  ointment (MYCOSTATIN ) Apply 1 Application topically 2 (two) times daily.   ondansetron  (ZOFRAN -ODT) 4 MG disintegrating tablet Take 1 tablet (4 mg total) by mouth every 8 (eight) hours as needed for nausea or vomiting.   pantoprazole  (PROTONIX ) 40 MG tablet TAKE 1 TABLET BY MOUTH AT  BEDTIME   pravastatin  (PRAVACHOL ) 20 MG tablet Take 1 tablet (20 mg total) by mouth at bedtime.   promethazine -dextromethorphan (PROMETHAZINE -DM) 6.25-15 MG/5ML syrup Take 5 mLs by mouth 4 (four) times daily as needed for up to 9 days for cough.   traZODone (DESYREL) 50 MG tablet Take 50-100 mg by mouth at bedtime.   No facility-administered encounter medications on file as of 10/02/2024.    BP 130/64   Pulse 86   Temp 98.1 F (36.7 C) (Oral)   Ht 5' 2 (1.575 m)   Wt 141 lb (64 kg)   SpO2 96%   BMI  25.79 kg/m  BP Readings from Last 3 Encounters:  10/02/24 130/64  08/30/24 132/64  08/22/24 (!) 154/72    Physical Exam Constitutional:      Appearance: Normal appearance.  HENT:     Right Ear: Tympanic membrane normal. There is no impacted cerumen.     Left Ear: Tympanic membrane normal. There is no impacted cerumen.     Mouth/Throat:     Mouth: Mucous membranes are moist.     Pharynx: Oropharynx is clear.  Cardiovascular:     Rate and Rhythm: Normal rate and regular rhythm.  Pulmonary:     Effort: Pulmonary effort is normal. No respiratory distress.     Breath sounds: No rhonchi or rales.     Comments: Intermittent  coughing during visit  Abdominal:     General: Abdomen is flat. Bowel sounds are normal. There is no distension.     Palpations: Abdomen is soft.     Tenderness: There is no abdominal tenderness.  Musculoskeletal:        General: Normal range of motion.     Right lower leg: No edema.     Left lower leg: No edema.  Skin:    General: Skin is warm and dry.     Capillary Refill: Capillary refill takes less than 2 seconds.  Neurological:     General: No focal deficit present.     Mental Status: She is alert and oriented to person, place, and time.  Psychiatric:        Mood and Affect: Mood normal.        Behavior: Behavior normal.        07/02/2024    6:17 PM 05/29/2024    1:16 PM 02/01/2024    1:34 PM  Depression screen PHQ 2/9  Decreased Interest  0 0  Down, Depressed, Hopeless  3 3  PHQ - 2 Score  3 3  Altered sleeping  0 0  Tired, decreased energy  3 3  Change in appetite  0 0  Feeling bad or failure about yourself   0 0  Trouble concentrating  0 0  Moving slowly or fidgety/restless  0 0  Suicidal thoughts  0 0  PHQ-9 Score  6  6   Difficult doing work/chores  Somewhat difficult Very difficult     Information is confidential and restricted. Go to Review Flowsheets to unlock data.   Data saved with a previous flowsheet row definition       07/02/2024    6:17 PM 05/29/2024    1:16 PM 01/24/2024    2:58 PM 09/26/2023    2:49 PM  GAD 7 : Generalized Anxiety Score  Nervous, Anxious, on Edge  3  3  2    Control/stop worrying  3  3  2    Worry too much - different things  3  3  2    Trouble relaxing  3  3  0   Restless  2  2  0   Easily annoyed or irritable  0  0  0   Afraid - awful might happen  0  0  0   Total GAD 7 Score  14 14 6   Anxiety Difficulty  Somewhat difficult Somewhat difficult Not difficult at all     Information is confidential and restricted. Go to Review Flowsheets to unlock data.   Data saved with a previous flowsheet row definition    Results for  orders placed or performed in visit on 10/02/24  POC Covid19/Flu A&B Antigen  Result Value Ref Range   Influenza A Antigen, POC Negative Negative   Influenza B Antigen, POC Negative Negative   Covid Antigen, POC Negative Negative      The 10-year ASCVD risk score (Arnett DK, et al., 2019) is: 39.5%    Assessment & Plan:  Acute bronchitis, unspecified organism Likely viral etiology, negative for flu and COVID today. Has had 4 days of symptoms. Reassuring exam today, treat symptomatically, tylenol  as needed for fever, mucinex for sputum, promethazine -DM PRN cough. Encouraged rest and PO hydration. Return precautions reviewed.  -     POC Covid19/Flu A&B Antigen  Other orders -     Promethazine -DM; Take 5 mLs by mouth 4 (four) times daily as needed for up to 9 days for cough.  Dispense: 118 mL; Refill: 0     Return if symptoms worsen or fail to improve.    Harlene Saddler, MD "
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
## 2024-10-11 ENCOUNTER — Encounter: Payer: Self-pay | Admitting: Psychiatry

## 2024-10-11 ENCOUNTER — Telehealth (INDEPENDENT_AMBULATORY_CARE_PROVIDER_SITE_OTHER): Admitting: Psychiatry

## 2024-10-11 DIAGNOSIS — F331 Major depressive disorder, recurrent, moderate: Secondary | ICD-10-CM | POA: Diagnosis not present

## 2024-10-11 DIAGNOSIS — F411 Generalized anxiety disorder: Secondary | ICD-10-CM | POA: Diagnosis not present

## 2024-10-11 MED ORDER — BUSPIRONE HCL 10 MG PO TABS: 10.0000 mg | ORAL_TABLET | Freq: Two times a day (BID) | ORAL | 0 refills | Status: AC

## 2024-10-11 MED ORDER — CLONAZEPAM 1 MG PO TABS: 1.0000 mg | ORAL_TABLET | Freq: Two times a day (BID) | ORAL | 0 refills | Status: AC | PRN

## 2024-10-11 NOTE — Patient Instructions (Signed)
 Restart Buspar  10 mg twice a day  Please notify us  if any worsening in crying spells after restarting buspar  Continue trazodone 50 mg at night as needed for insomnia Continue clonazepam  1 mg twice a day as needed for anxiety  Next appointment- 3/19 at 2:30

## 2024-11-06 ENCOUNTER — Ambulatory Visit: Admitting: Family Medicine

## 2024-11-29 ENCOUNTER — Ambulatory Visit: Admitting: Psychiatry

## 2025-02-06 ENCOUNTER — Ambulatory Visit
